# Patient Record
Sex: Male | Born: 1945
Health system: Southern US, Community
[De-identification: ages and names within clinical notes are randomized; demographics above are authoritative.]

## PROBLEM LIST (undated history)

## (undated) DIAGNOSIS — K219 Gastro-esophageal reflux disease without esophagitis: Secondary | ICD-10-CM

## (undated) DIAGNOSIS — E785 Hyperlipidemia, unspecified: Secondary | ICD-10-CM

## (undated) DIAGNOSIS — R079 Chest pain, unspecified: Secondary | ICD-10-CM

## (undated) DIAGNOSIS — G40209 Localization-related (focal) (partial) symptomatic epilepsy and epileptic syndromes with complex partial seizures, not intractable, without status epilepticus: Secondary | ICD-10-CM

## (undated) DIAGNOSIS — R569 Unspecified convulsions: Secondary | ICD-10-CM

## (undated) DIAGNOSIS — R001 Bradycardia, unspecified: Secondary | ICD-10-CM

## (undated) DIAGNOSIS — N529 Male erectile dysfunction, unspecified: Secondary | ICD-10-CM

## (undated) DIAGNOSIS — M1990 Unspecified osteoarthritis, unspecified site: Secondary | ICD-10-CM

## (undated) DIAGNOSIS — J3489 Other specified disorders of nose and nasal sinuses: Secondary | ICD-10-CM

## (undated) DIAGNOSIS — H9193 Unspecified hearing loss, bilateral: Secondary | ICD-10-CM

## (undated) DIAGNOSIS — K8020 Calculus of gallbladder without cholecystitis without obstruction: Secondary | ICD-10-CM

## (undated) DIAGNOSIS — R109 Unspecified abdominal pain: Secondary | ICD-10-CM

## (undated) DIAGNOSIS — F329 Major depressive disorder, single episode, unspecified: Secondary | ICD-10-CM

## (undated) DIAGNOSIS — E079 Disorder of thyroid, unspecified: Secondary | ICD-10-CM

## (undated) DIAGNOSIS — F3289 Other specified depressive episodes: Secondary | ICD-10-CM

## (undated) DIAGNOSIS — J45909 Unspecified asthma, uncomplicated: Secondary | ICD-10-CM

## (undated) DIAGNOSIS — N200 Calculus of kidney: Secondary | ICD-10-CM

## (undated) HISTORY — DX: Unspecified hearing loss, bilateral: H91.93

## (undated) HISTORY — DX: Calculus of kidney: N20.0

## (undated) HISTORY — DX: Hyperlipidemia, unspecified: E78.5

## (undated) HISTORY — DX: Disorder of thyroid, unspecified: E07.9

## (undated) HISTORY — DX: Gastro-esophageal reflux disease without esophagitis: K21.9

## (undated) HISTORY — DX: Localization-related (focal) (partial) symptomatic epilepsy and epileptic syndromes with complex partial seizures, not intractable, without status epilepticus: G40.209

## (undated) HISTORY — DX: Unspecified asthma, uncomplicated: J45.909

## (undated) HISTORY — DX: Major depressive disorder, single episode, unspecified: F32.9

## (undated) HISTORY — DX: Male erectile dysfunction, unspecified: N52.9

## (undated) HISTORY — DX: Unspecified convulsions: R56.9

## (undated) HISTORY — DX: Unspecified osteoarthritis, unspecified site: M19.90

## (undated) HISTORY — DX: Other specified depressive episodes: F32.89

## (undated) HISTORY — DX: Other specified disorders of nose and nasal sinuses: J34.89

---

## 1994-07-15 HISTORY — PX: OTHER SURGICAL HISTORY: SHX169

## 2002-09-22 ENCOUNTER — Ambulatory Visit (HOSPITAL_COMMUNITY): Admission: RE | Admit: 2002-09-22 | Discharge: 2002-09-22 | Payer: Self-pay | Admitting: Internal Medicine

## 2009-01-12 LAB — HM COLONOSCOPY

## 2009-09-13 LAB — FECAL OCCULT BLOOD, GUAIAC: Fecal Occult Blood: NEGATIVE

## 2010-10-05 ENCOUNTER — Encounter: Payer: Self-pay | Admitting: Family Medicine

## 2010-10-05 DIAGNOSIS — E785 Hyperlipidemia, unspecified: Secondary | ICD-10-CM | POA: Insufficient documentation

## 2010-10-05 DIAGNOSIS — G40209 Localization-related (focal) (partial) symptomatic epilepsy and epileptic syndromes with complex partial seizures, not intractable, without status epilepticus: Secondary | ICD-10-CM | POA: Insufficient documentation

## 2010-10-05 DIAGNOSIS — N529 Male erectile dysfunction, unspecified: Secondary | ICD-10-CM

## 2010-10-05 DIAGNOSIS — N200 Calculus of kidney: Secondary | ICD-10-CM | POA: Insufficient documentation

## 2010-10-05 DIAGNOSIS — J3489 Other specified disorders of nose and nasal sinuses: Secondary | ICD-10-CM | POA: Insufficient documentation

## 2010-10-05 DIAGNOSIS — H269 Unspecified cataract: Secondary | ICD-10-CM | POA: Insufficient documentation

## 2010-10-05 DIAGNOSIS — F329 Major depressive disorder, single episode, unspecified: Secondary | ICD-10-CM

## 2010-10-05 DIAGNOSIS — N400 Enlarged prostate without lower urinary tract symptoms: Secondary | ICD-10-CM | POA: Insufficient documentation

## 2010-10-05 DIAGNOSIS — K644 Residual hemorrhoidal skin tags: Secondary | ICD-10-CM | POA: Insufficient documentation

## 2011-10-29 ENCOUNTER — Ambulatory Visit: Payer: Medicare Other | Attending: Internal Medicine | Admitting: Physical Therapy

## 2011-10-29 DIAGNOSIS — R5381 Other malaise: Secondary | ICD-10-CM | POA: Insufficient documentation

## 2011-10-29 DIAGNOSIS — M25619 Stiffness of unspecified shoulder, not elsewhere classified: Secondary | ICD-10-CM | POA: Insufficient documentation

## 2011-10-29 DIAGNOSIS — M25519 Pain in unspecified shoulder: Secondary | ICD-10-CM | POA: Insufficient documentation

## 2011-10-29 DIAGNOSIS — IMO0001 Reserved for inherently not codable concepts without codable children: Secondary | ICD-10-CM | POA: Insufficient documentation

## 2011-11-01 ENCOUNTER — Ambulatory Visit: Payer: Medicare Other | Admitting: Physical Therapy

## 2011-11-05 ENCOUNTER — Ambulatory Visit: Payer: Medicare Other | Admitting: *Deleted

## 2011-11-07 ENCOUNTER — Ambulatory Visit: Payer: Medicare Other | Admitting: *Deleted

## 2011-11-11 ENCOUNTER — Ambulatory Visit: Payer: Medicare Other | Admitting: Physical Therapy

## 2011-11-14 ENCOUNTER — Ambulatory Visit: Payer: Medicare Other | Attending: Internal Medicine | Admitting: *Deleted

## 2011-11-14 DIAGNOSIS — R5381 Other malaise: Secondary | ICD-10-CM | POA: Insufficient documentation

## 2011-11-14 DIAGNOSIS — IMO0001 Reserved for inherently not codable concepts without codable children: Secondary | ICD-10-CM | POA: Insufficient documentation

## 2011-11-14 DIAGNOSIS — M25619 Stiffness of unspecified shoulder, not elsewhere classified: Secondary | ICD-10-CM | POA: Insufficient documentation

## 2011-11-14 DIAGNOSIS — M25519 Pain in unspecified shoulder: Secondary | ICD-10-CM | POA: Insufficient documentation

## 2011-11-19 ENCOUNTER — Ambulatory Visit: Payer: Medicare Other | Admitting: Physical Therapy

## 2011-11-21 ENCOUNTER — Ambulatory Visit: Payer: Medicare Other | Admitting: Physical Therapy

## 2011-11-26 ENCOUNTER — Ambulatory Visit: Payer: Medicare Other | Admitting: Physical Therapy

## 2011-11-28 ENCOUNTER — Ambulatory Visit: Payer: Medicare Other | Admitting: Physical Therapy

## 2011-12-03 ENCOUNTER — Ambulatory Visit: Payer: Medicare Other | Admitting: Physical Therapy

## 2011-12-05 ENCOUNTER — Ambulatory Visit: Payer: Medicare Other | Admitting: Physical Therapy

## 2012-03-26 ENCOUNTER — Ambulatory Visit: Payer: Medicare Other | Admitting: Cardiology

## 2012-04-22 ENCOUNTER — Encounter: Payer: Self-pay | Admitting: Cardiology

## 2012-04-22 ENCOUNTER — Ambulatory Visit (INDEPENDENT_AMBULATORY_CARE_PROVIDER_SITE_OTHER): Payer: Medicare Other | Admitting: Cardiology

## 2012-04-22 VITALS — BP 105/70 | HR 59 | Ht 66.0 in | Wt 159.0 lb

## 2012-04-22 DIAGNOSIS — R9431 Abnormal electrocardiogram [ECG] [EKG]: Secondary | ICD-10-CM

## 2012-04-22 NOTE — Patient Instructions (Addendum)
The current medical regimen is effective;  continue present plan and medications.  Your physician has requested that you have an echocardiogram. Echocardiography is a painless test that uses sound waves to create images of your heart. It provides your doctor with information about the size and shape of your heart and how well your heart's chambers and valves are working. This procedure takes approximately one hour. There are no restrictions for this procedure.  Follow up as needed. 

## 2012-04-22 NOTE — Progress Notes (Signed)
HPI The patient presents as a new patient for evaluation of an abnormal EKG. He has no prior cardiac history. I did review outside records which included an exercise treadmill test in 2008 and there was no evidence of ischemia on this. He had a narrow complex EKG. He now presents with an EKG demonstrating wide complex consistent with an interventricular conduction delay or atypical left bundle branch block. He does not describe any cardiovascular symptoms. He walks daily. He exercises in rehabilitation review shoulder. He does some exercising at home. With this he denies any chest pressure, neck or arm discomfort. He never notices any palpitations, presyncope or syncope. He has had no PND or orthopnea. He denies any bleeding or edema. I reviewed labs that included normal electrolytes with only minimally elevated potassium. His most recent LDL was 89 with HDL 51. I reviewed several blood pressure readings from previous primary care appointments and these were normal.   No Known Allergies  Current Outpatient Prescriptions  Medication Sig Dispense Refill  . aspirin 81 MG EC tablet Take 81 mg by mouth daily.        Marland Kitchen atorvastatin (LIPITOR) 40 MG tablet Take 40 mg by mouth daily. 40 MGS  ON MON-WED-FRI 20 MGS ON TUE-THUR-SAT-SUN       . Cholecalciferol (VITAMIN D3) 2000 UNITS TABS Take 1 tablet by mouth daily.        Marland Kitchen escitalopram (LEXAPRO) 20 MG tablet Take 20 mg by mouth daily.        Marland Kitchen ezetimibe (ZETIA) 10 MG tablet Take 10 mg by mouth daily.      . fish oil-omega-3 fatty acids 1000 MG capsule Take 1 g by mouth daily.      . folic acid (FOLVITE) 1 MG tablet Take 1 mg by mouth daily.        . Lacosamide (VIMPAT) 150 MG TABS Take 1 tablet by mouth 2 (two) times daily.        Marland Kitchen levothyroxine (SYNTHROID, LEVOTHROID) 50 MCG tablet Take 50 mcg by mouth daily.      . methotrexate (RHEUMATREX) 5 MG tablet Take 5 mg by mouth 2 (two) times a week. Caution: Chemotherapy. Protect from light.       .  primidone (MYSOLINE) 250 MG tablet Take 250 mg by mouth 4 (four) times daily.        Past Medical History  Diagnosis Date  . Other and unspecified hyperlipidemia   . Depressive disorder, not elsewhere classified   . Localization-related (focal) (partial) epilepsy and epileptic syndromes with complex partial seizures, without mention of intractable epilepsy   . Kidney stone   . Impotence of organic origin   . External hemorrhoid   . Nasal septal perforation   . Cataract   . Hyperplasia of prostate     Past Surgical History  Procedure Date  . Left shoulder surgery     Family History  Problem Relation Age of Onset  . Kidney failure Father     died age 93  . Alzheimer's disease Father     died age 79    History   Social History  . Marital Status: Married    Spouse Name: N/A    Number of Children: 2  . Years of Education: N/A   Occupational History  . Retired    Social History Main Topics  . Smoking status: Never Smoker   . Smokeless tobacco: Not on file  . Alcohol Use: No  . Drug Use: No  .  Sexually Active: Not on file   Other Topics Concern  . Not on file   Social History Narrative   Lives with wife.      ROS: Arthritis seasonal allergies, occasional diarrhea, hearing loss. Otherwise as stated in the history of present illness and negative for all other systems.  PHYSICAL EXAM BP 105/70  Pulse 59  Ht 5\' 6"  (1.676 m)  Wt 72.122 kg (159 lb)  BMI 25.66 kg/m2 GENERAL:  Well appearing HEENT:  Pupils equal round and reactive, fundi not visualized, oral mucosa unremarkable NECK:  No jugular venous distention, waveform within normal limits, carotid upstroke brisk and symmetric, no bruits, no thyromegaly LYMPHATICS:  No cervical, inguinal adenopathy LUNGS:  Clear to auscultation bilaterally BACK:  No CVA tenderness CHEST:  Unremarkable HEART:  PMI not displaced or sustained,S1 and S2 within normal limits, no S3, no S4, no clicks, no rubs, no murmurs ABD:   Flat, positive bowel sounds normal in frequency in pitch, no bruits, no rebound, no guarding, no midline pulsatile mass, no hepatomegaly, no splenomegaly EXT:  2 plus pulses throughout, no edema, no cyanosis no clubbing SKIN:  No rashes no nodules NEURO:  Cranial nerves II through XII grossly intact, motor grossly intact throughout [facility]:  Cognitively intact, oriented to person place and time   EKG:  Sinus bradycardia, rate 59, left bundle branch block, left axis deviation 04/22/2012  ASSESSMENT AND PLAN  Abnormal EKG - I do not suspect this represents obstructive coronary disease. I don't think that he is having asymptomatic bradycardia arrhythmias consistent with this conduction abnormality. I will make sure he has a structurally normal heart and no evidence of previous silent MI.  I will order and echocardiogram. If this is normal, in the absence of further symptoms no further testing will be indicated.   Hyperlipidemia  I reviewed these as above. This is excellent and managed by Dr. Christell Constant. No change in therapy is indicated.

## 2012-04-29 ENCOUNTER — Ambulatory Visit (HOSPITAL_COMMUNITY): Payer: Medicare Other | Attending: Cardiology | Admitting: Radiology

## 2012-04-29 ENCOUNTER — Other Ambulatory Visit: Payer: Self-pay

## 2012-04-29 ENCOUNTER — Other Ambulatory Visit (HOSPITAL_COMMUNITY): Payer: Self-pay | Admitting: Cardiology

## 2012-04-29 DIAGNOSIS — R9431 Abnormal electrocardiogram [ECG] [EKG]: Secondary | ICD-10-CM | POA: Insufficient documentation

## 2012-04-29 MED ORDER — PERFLUTREN PROTEIN A MICROSPH IV SUSP
2.0000 mL | Freq: Once | INTRAVENOUS | Status: AC
  Administered 2012-04-29: 2 mL via INTRAVENOUS

## 2012-05-04 ENCOUNTER — Telehealth: Payer: Self-pay

## 2012-05-04 NOTE — Telephone Encounter (Signed)
Patient aware of Echo results.

## 2012-05-04 NOTE — Telephone Encounter (Signed)
Message copied by Yolonda Kida on Mon May 04, 2012  4:01 PM ------      Message from: Rollene Rotunda      Created: Sun May 03, 2012  8:57 PM       No significant abnormalities.  No further work up.  Call Mr. Missildine with the results and send results to Rudi Heap, MD

## 2012-12-28 ENCOUNTER — Ambulatory Visit: Payer: Medicare Other | Attending: Neurology

## 2012-12-28 DIAGNOSIS — IMO0001 Reserved for inherently not codable concepts without codable children: Secondary | ICD-10-CM | POA: Insufficient documentation

## 2012-12-28 DIAGNOSIS — M2560 Stiffness of unspecified joint, not elsewhere classified: Secondary | ICD-10-CM | POA: Insufficient documentation

## 2012-12-28 DIAGNOSIS — Z9181 History of falling: Secondary | ICD-10-CM | POA: Insufficient documentation

## 2012-12-28 DIAGNOSIS — R5381 Other malaise: Secondary | ICD-10-CM | POA: Insufficient documentation

## 2012-12-31 ENCOUNTER — Ambulatory Visit: Payer: Medicare Other | Admitting: Physical Therapy

## 2013-01-04 ENCOUNTER — Ambulatory Visit: Payer: Medicare Other | Admitting: Physical Therapy

## 2013-01-06 ENCOUNTER — Ambulatory Visit: Payer: Medicare Other | Admitting: Physical Therapy

## 2013-01-12 ENCOUNTER — Ambulatory Visit: Payer: Medicare Other | Attending: Neurology | Admitting: Physical Therapy

## 2013-01-12 DIAGNOSIS — IMO0001 Reserved for inherently not codable concepts without codable children: Secondary | ICD-10-CM | POA: Insufficient documentation

## 2013-01-12 DIAGNOSIS — Z9181 History of falling: Secondary | ICD-10-CM | POA: Insufficient documentation

## 2013-01-12 DIAGNOSIS — R5381 Other malaise: Secondary | ICD-10-CM | POA: Insufficient documentation

## 2013-01-12 DIAGNOSIS — M2560 Stiffness of unspecified joint, not elsewhere classified: Secondary | ICD-10-CM | POA: Insufficient documentation

## 2013-01-14 ENCOUNTER — Encounter: Payer: Self-pay | Admitting: Family Medicine

## 2013-01-14 ENCOUNTER — Ambulatory Visit (INDEPENDENT_AMBULATORY_CARE_PROVIDER_SITE_OTHER): Payer: Medicare Other | Admitting: Family Medicine

## 2013-01-14 ENCOUNTER — Ambulatory Visit: Payer: Medicare Other | Admitting: Physical Therapy

## 2013-01-14 VITALS — BP 103/56 | HR 57 | Temp 97.3°F | Ht 65.5 in | Wt 154.0 lb

## 2013-01-14 DIAGNOSIS — R5383 Other fatigue: Secondary | ICD-10-CM

## 2013-01-14 DIAGNOSIS — R5381 Other malaise: Secondary | ICD-10-CM

## 2013-01-14 DIAGNOSIS — E785 Hyperlipidemia, unspecified: Secondary | ICD-10-CM

## 2013-01-14 DIAGNOSIS — Z79899 Other long term (current) drug therapy: Secondary | ICD-10-CM

## 2013-01-14 DIAGNOSIS — E559 Vitamin D deficiency, unspecified: Secondary | ICD-10-CM

## 2013-01-14 LAB — HEPATIC FUNCTION PANEL
ALT: 31 U/L (ref 0–53)
AST: 37 U/L (ref 0–37)
Bilirubin, Direct: 0.1 mg/dL (ref 0.0–0.3)
Total Protein: 7.2 g/dL (ref 6.0–8.3)

## 2013-01-14 LAB — POCT CBC
Granulocyte percent: 60.4 %G (ref 37–80)
HCT, POC: 38.4 % — AB (ref 43.5–53.7)
Lymph, poc: 2.1 (ref 0.6–3.4)
MCH, POC: 36.4 pg — AB (ref 27–31.2)
MCV: 101.6 fL — AB (ref 80–97)
Platelet Count, POC: 274 10*3/uL (ref 142–424)
RDW, POC: 13.5 %
WBC: 6.8 10*3/uL (ref 4.6–10.2)

## 2013-01-14 NOTE — Addendum Note (Signed)
Addended by: Lisbeth Ply C on: 01/14/2013 10:49 AM   Modules accepted: Orders

## 2013-01-14 NOTE — Progress Notes (Signed)
  Subjective:    Patient ID: Troy Rodgers, male    DOB: May 30, 1946, 67 y.o.   MRN: [id number]  HPI Patient comes in today for followup of chronic medical problems. These include hyperlipidemia, hypothyroidism, rheumatoid arthritis, gastroesophageal reflux, and seizure disorder. He is up-to-date on all of his health maintenance issues. The patient is followed by a rheumatologist and a neurologist. He is seen by both of these specialists every 3-4 months. He will have labs drawn today to match it with his diagnoses. His seizure medicine  drug levels are done by the neurologist. He will need a PSA and prostate exam on the next visit. As of note he is also hearing impaired and wears hearing aids bilaterally.   Review of Systems  Constitutional: Positive for fatigue.  HENT: Positive for congestion (slight, some drainage) and postnasal drip. Negative for ear pain.   Eyes: Positive for discharge (watery).  Respiratory: Negative.   Cardiovascular: Negative.   Gastrointestinal: Positive for diarrhea (occasional).  Genitourinary: Negative.   Musculoskeletal: Positive for arthralgias (RA, controlled).  Skin: Negative.   Allergic/Immunologic: Positive for environmental allergies (seasonal).  Neurological: Positive for seizures (minor, 1-2 x month). Negative for dizziness and headaches.  Psychiatric/Behavioral: Negative.        Objective:   Physical Exam BP 103/56  Pulse 57  Temp(Src) 97.3 F (36.3 C) (Oral)  Ht 5' 5.5" (1.664 m)  Wt 154 lb (69.854 kg)  BMI 25.23 kg/m2  The patient appeared well nourished and normally developed, alert and oriented to time and place. Speech, behavior and judgement appear normal. Vital signs as documented.  Head exam is unremarkable. No scleral icterus or pallor noted. He is wearing bilateral hearing aids.  Neck is without jugular venous distension, thyromegally, or carotid bruits. Carotid upstrokes are brisk bilaterally. No cervical adenopathy. Lungs are clear  anteriorly and posteriorly to auscultation. Normal respiratory effort. The patient has a mild degree of kyphosis. Cardiac exam reveals regular rate and rhythm at 60 per minute. First and second heart sounds normal.  No murmurs, rubs or gallops.  Abdominal exam reveals normal bowl sounds, no masses, no organomegaly and no aortic enlargement. No inguinal adenopathy. There was no abdominal tenderness. Extremities are nonedematous and both femoral and pedal pulses are normal. Skin without pallor or jaundice.  Warm and dry, without rash. Neurologic exam reveals normal deep tendon reflexes and normal sensation.          Assessment & Plan:  1. Fatigue - POCT CBC; Standing  2. Vitamin D deficiency - Vitamin D 25 hydroxy; Standing  3. Hyperlipemia - Hepatic function panel; Standing - NMR Lipoprofile with Lipids; Standing  4. High risk medication use - Hepatic function panel; Standing - BASIC METABOLIC PANEL WITH GFR; Standing  Patient Instructions  Fall precautions discussed Continue current meds and therapeutic lifestyle changes You will be due for a prostate exam and PSA on the next visit. Exercise regularly and drink plenty of fluids

## 2013-01-14 NOTE — Patient Instructions (Addendum)
Fall precautions discussed Continue current meds and therapeutic lifestyle changes You will be due for a prostate exam and PSA on the next visit. Exercise regularly and drink plenty of fluids

## 2013-01-15 LAB — BASIC METABOLIC PANEL WITH GFR
BUN: 17 mg/dL (ref 6–23)
Calcium: 9.5 mg/dL (ref 8.4–10.5)
GFR, Est African American: 88 mL/min
GFR, Est Non African American: 76 mL/min
Glucose, Bld: 70 mg/dL (ref 70–99)
Sodium: 138 mEq/L (ref 135–145)

## 2013-01-18 ENCOUNTER — Ambulatory Visit: Payer: Medicare Other | Admitting: Physical Therapy

## 2013-01-18 LAB — NMR LIPOPROFILE WITH LIPIDS
Cholesterol, Total: 139 mg/dL (ref <200)
HDL Particle Number: 27.7 umol/L — ABNORMAL LOW (ref >=30.5)
LDL Particle Number: 1086 nmol/L — ABNORMAL HIGH (ref <1000)
LDL Size: 20.7 nm (ref >20.5)
Large VLDL-P: <0.8 nmol/L (ref <=2.7)
Small LDL Particle Number: 337 nmol/L (ref <=527)

## 2013-01-21 ENCOUNTER — Ambulatory Visit: Payer: Medicare Other

## 2013-01-26 ENCOUNTER — Ambulatory Visit: Payer: Medicare Other | Admitting: Physical Therapy

## 2013-01-28 ENCOUNTER — Telehealth: Payer: Self-pay | Admitting: Family Medicine

## 2013-01-28 ENCOUNTER — Ambulatory Visit: Payer: Medicare Other | Admitting: Physical Therapy

## 2013-01-29 ENCOUNTER — Telehealth: Payer: Self-pay | Admitting: Family Medicine

## 2013-02-01 NOTE — Telephone Encounter (Signed)
Discussed lipro profile results. Pt confused over LDL number over 1000.

## 2013-02-01 NOTE — Telephone Encounter (Signed)
Dup note  

## 2013-02-02 ENCOUNTER — Ambulatory Visit: Payer: Medicare Other | Admitting: Physical Therapy

## 2013-02-04 ENCOUNTER — Ambulatory Visit: Payer: Medicare Other | Admitting: Physical Therapy

## 2013-02-10 ENCOUNTER — Encounter: Payer: Self-pay | Admitting: *Deleted

## 2013-02-17 ENCOUNTER — Telehealth: Payer: Self-pay | Admitting: Family Medicine

## 2013-02-17 ENCOUNTER — Other Ambulatory Visit: Payer: Self-pay

## 2013-02-22 NOTE — Telephone Encounter (Signed)
Letters were being sent out incorrectly to some patients and the appropriate personnel at [facility] has been notified and the problem corrected.

## 2013-02-25 ENCOUNTER — Ambulatory Visit (INDEPENDENT_AMBULATORY_CARE_PROVIDER_SITE_OTHER): Payer: Medicare Other | Admitting: Family Medicine

## 2013-02-25 ENCOUNTER — Ambulatory Visit (INDEPENDENT_AMBULATORY_CARE_PROVIDER_SITE_OTHER): Payer: Medicare Other

## 2013-02-25 ENCOUNTER — Encounter: Payer: Self-pay | Admitting: Family Medicine

## 2013-02-25 VITALS — BP 109/63 | HR 53 | Temp 97.3°F | Ht 65.5 in | Wt 154.4 lb

## 2013-02-25 DIAGNOSIS — S96912A Strain of unspecified muscle and tendon at ankle and foot level, left foot, initial encounter: Secondary | ICD-10-CM

## 2013-02-25 DIAGNOSIS — M79672 Pain in left foot: Secondary | ICD-10-CM

## 2013-02-25 DIAGNOSIS — M79609 Pain in unspecified limb: Secondary | ICD-10-CM

## 2013-02-25 NOTE — Progress Notes (Signed)
  Subjective:    Patient ID: Troy Rodgers., male    DOB: 1946-02-19, 67 y.o.   MRN: [id number]  HPI Patient complains of left foot pain and swelling for one week. There has been no injry that he is aware of.    Review of Systems  Constitutional: Negative.   HENT: Negative.   Eyes: Negative.   Respiratory: Negative.   Cardiovascular: Negative.   Gastrointestinal: Negative.   Endocrine: Negative.   Genitourinary: Negative.   Musculoskeletal: Positive for arthralgias (L foot x 1 week, swelling).  Allergic/Immunologic: Negative.   Neurological: Negative.   Psychiatric/Behavioral: Negative.        Objective:   Physical Exam  Tender left dorsal foot lateral aspect. More pain with flexion, more pain with inversion. There was no redness or edema.  [facility] reading (PRIMARY) by  Dr.Cianni Manny: Left foot; within normal limits                                     Assessment & Plan:  1. Left foot pain - DG Foot Complete Left  2. Strain of left foot, initial encounter Patient Instructions  Use warm wet compresses 20 minutes 3 or 4 times daily Try taking some Advil or ibuprofen over-the-counter 1 x  3 times daily after meals for 5-7 days Make sure that you take your omeprazole regularly to protect her stomach from the ibuprofen   Nyra Capes MD

## 2013-02-25 NOTE — Patient Instructions (Signed)
Use warm wet compresses 20 minutes 3 or 4 times daily Try taking some Advil or ibuprofen over-the-counter 1 x  3 times daily after meals for 5-7 days Make sure that you take your omeprazole regularly to protect her stomach from the ibuprofen

## 2013-02-28 IMAGING — CR DG CHEST 2V
2 series · 2 of 2 positions shown · non-contrast
Comparison: None.

CLINICAL DATA: Cardiomegaly

CHEST - 2 VIEW

[view not recorded (1 of 2)]
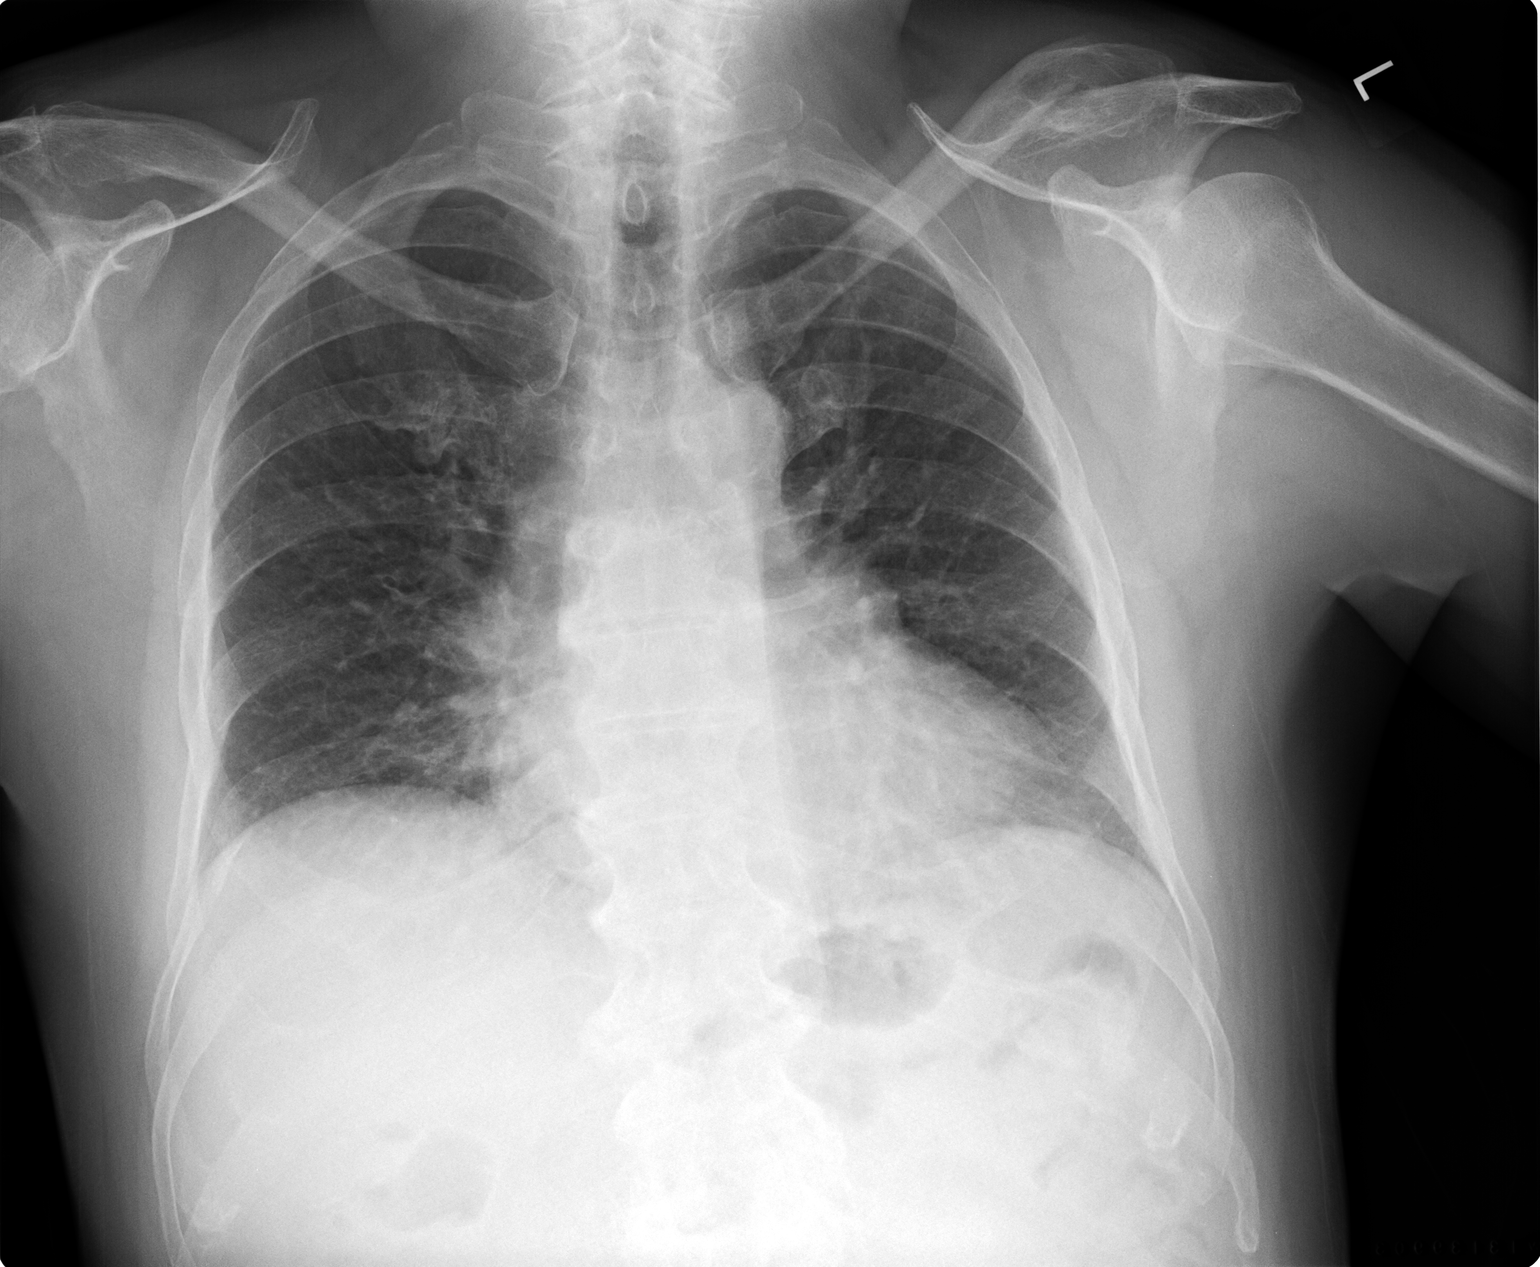

[view not recorded (2 of 2)]
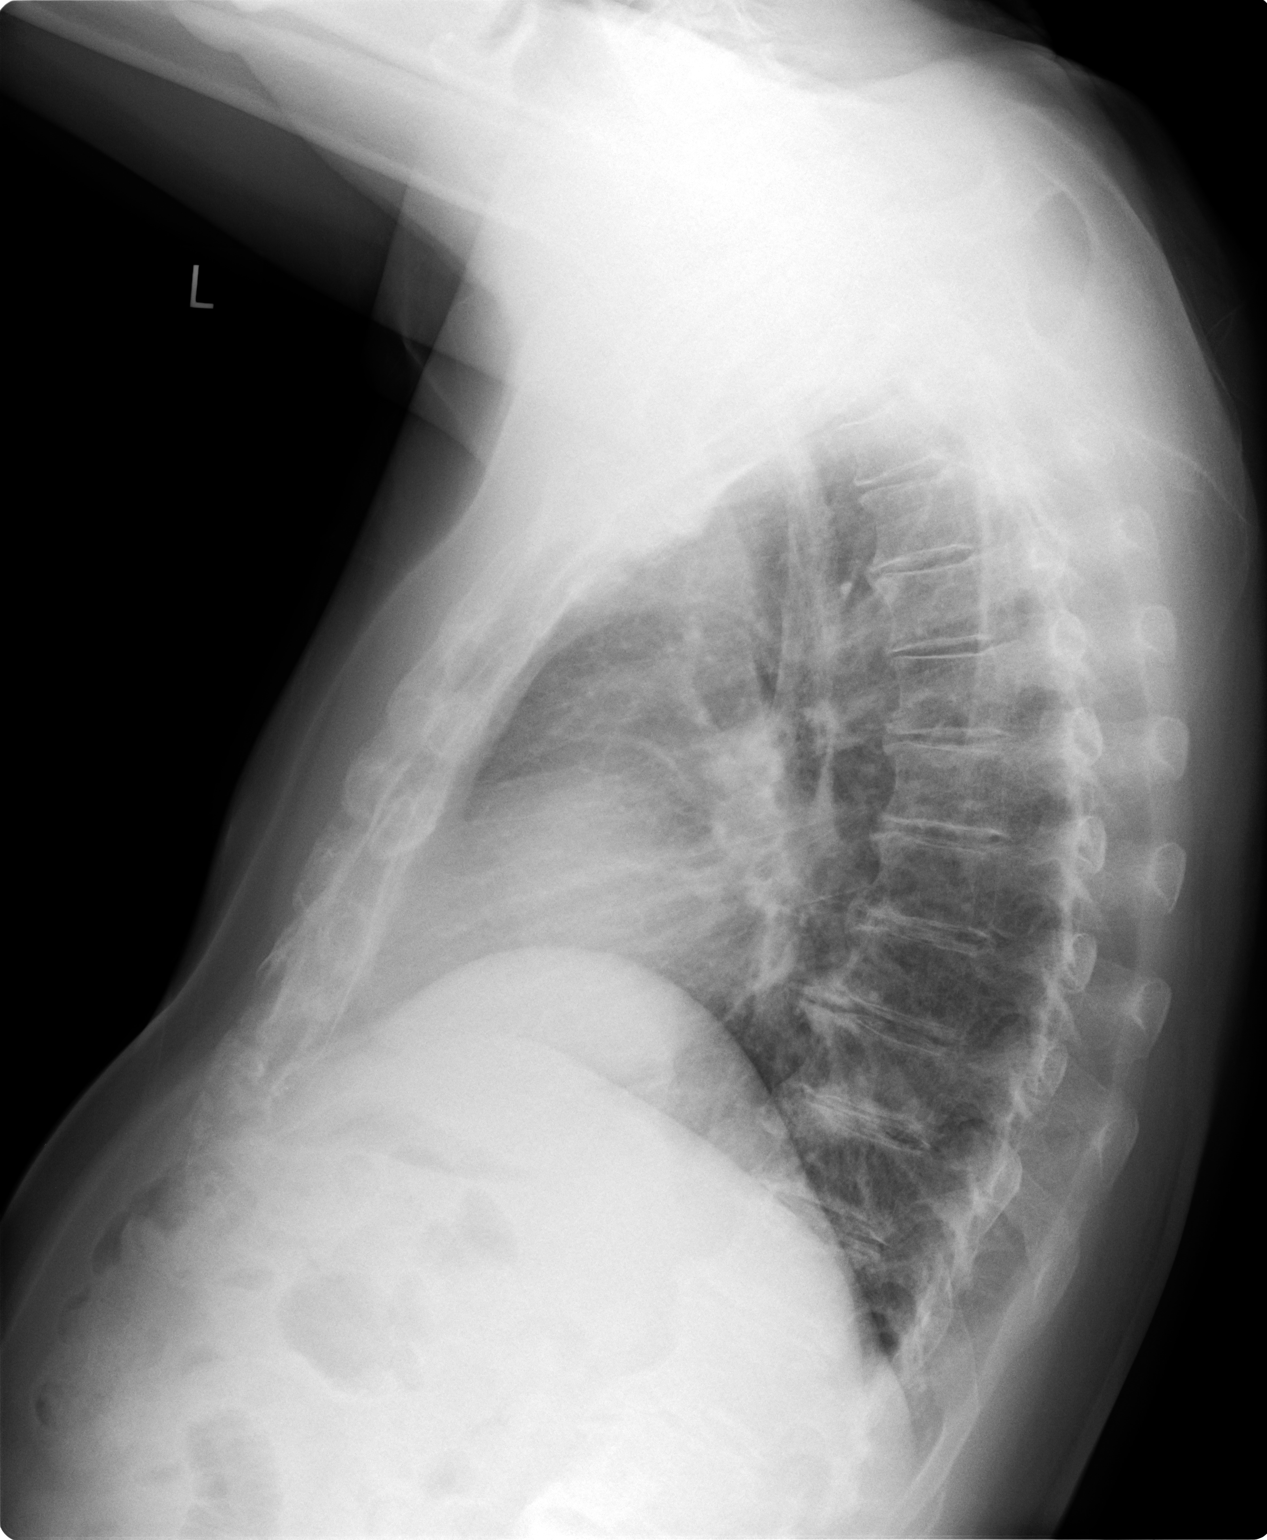

[2 of 2 positions shown; findings below may reference images not displayed]

FINDINGS: The heart is at the upper limits of normal in size.  The
lungs are clear.  No focal infiltrate is noted.  Degenerative
change of the thoracic spine is seen.
IMPRESSION: No acute abnormality noted.

## 2013-03-29 ENCOUNTER — Other Ambulatory Visit: Payer: Self-pay | Admitting: Family Medicine

## 2013-04-27 ENCOUNTER — Encounter (HOSPITAL_COMMUNITY): Payer: Self-pay | Admitting: Emergency Medicine

## 2013-04-27 ENCOUNTER — Observation Stay (HOSPITAL_COMMUNITY)
Admission: EM | Admit: 2013-04-27 | Discharge: 2013-04-30 | Disposition: A | Discharge status: Home / Self Care | Payer: Medicare Other | Attending: General Surgery | Admitting: General Surgery

## 2013-04-27 ENCOUNTER — Emergency Department (HOSPITAL_COMMUNITY): Payer: Medicare Other

## 2013-04-27 ENCOUNTER — Telehealth: Payer: Self-pay | Admitting: *Deleted

## 2013-04-27 DIAGNOSIS — K810 Acute cholecystitis: Principal | ICD-10-CM | POA: Insufficient documentation

## 2013-04-27 DIAGNOSIS — R5381 Other malaise: Secondary | ICD-10-CM | POA: Insufficient documentation

## 2013-04-27 DIAGNOSIS — R109 Unspecified abdominal pain: Secondary | ICD-10-CM

## 2013-04-27 DIAGNOSIS — R911 Solitary pulmonary nodule: Secondary | ICD-10-CM | POA: Insufficient documentation

## 2013-04-27 DIAGNOSIS — R55 Syncope and collapse: Secondary | ICD-10-CM

## 2013-04-27 DIAGNOSIS — K8020 Calculus of gallbladder without cholecystitis without obstruction: Secondary | ICD-10-CM

## 2013-04-27 DIAGNOSIS — E785 Hyperlipidemia, unspecified: Secondary | ICD-10-CM | POA: Insufficient documentation

## 2013-04-27 DIAGNOSIS — G40209 Localization-related (focal) (partial) symptomatic epilepsy and epileptic syndromes with complex partial seizures, not intractable, without status epilepticus: Secondary | ICD-10-CM | POA: Insufficient documentation

## 2013-04-27 DIAGNOSIS — J95821 Acute postprocedural respiratory failure: Secondary | ICD-10-CM | POA: Insufficient documentation

## 2013-04-27 DIAGNOSIS — Z79899 Other long term (current) drug therapy: Secondary | ICD-10-CM | POA: Insufficient documentation

## 2013-04-27 DIAGNOSIS — E039 Hypothyroidism, unspecified: Secondary | ICD-10-CM | POA: Insufficient documentation

## 2013-04-27 DIAGNOSIS — M069 Rheumatoid arthritis, unspecified: Secondary | ICD-10-CM | POA: Insufficient documentation

## 2013-04-27 HISTORY — DX: Bradycardia, unspecified: R00.1

## 2013-04-27 HISTORY — DX: Calculus of gallbladder without cholecystitis without obstruction: K80.20

## 2013-04-27 HISTORY — DX: Unspecified abdominal pain: R10.9

## 2013-04-27 HISTORY — DX: Chest pain, unspecified: R07.9

## 2013-04-27 LAB — CBC WITH DIFFERENTIAL/PLATELET
Basophils Absolute: 0 10*3/uL (ref 0.0–0.1)
Basophils Relative: 0 % (ref 0–1)
Eosinophils Absolute: 0 10*3/uL (ref 0.0–0.7)
Eosinophils Relative: 0 % (ref 0–5)
HCT: 36 % — ABNORMAL LOW (ref 39.0–52.0)
Lymphocytes Relative: 7 % — ABNORMAL LOW (ref 12–46)
MCHC: 36.1 g/dL — ABNORMAL HIGH (ref 30.0–36.0)
MCV: 100 fL (ref 78.0–100.0)
Monocytes Absolute: 3.7 10*3/uL — ABNORMAL HIGH (ref 0.1–1.0)
Neutrophils Relative %: 75 % (ref 43–77)
Platelets: 262 10*3/uL (ref 150–400)
RDW: 13.1 % (ref 11.5–15.5)
WBC: 20.7 10*3/uL — ABNORMAL HIGH (ref 4.0–10.5)

## 2013-04-27 LAB — URINALYSIS, ROUTINE W REFLEX MICROSCOPIC
Bilirubin Urine: NEGATIVE
Glucose, UA: NEGATIVE mg/dL
Hgb urine dipstick: NEGATIVE
Leukocytes, UA: NEGATIVE
Specific Gravity, Urine: 1.031 — ABNORMAL HIGH (ref 1.005–1.030)
Urobilinogen, UA: 0.2 mg/dL (ref 0.0–1.0)
pH: 5 (ref 5.0–8.0)

## 2013-04-27 LAB — URINE MICROSCOPIC-ADD ON

## 2013-04-27 LAB — COMPREHENSIVE METABOLIC PANEL
ALT: 50 U/L (ref 0–53)
AST: 63 U/L — ABNORMAL HIGH (ref 0–37)
Albumin: 3.2 g/dL — ABNORMAL LOW (ref 3.5–5.2)
CO2: 29 mEq/L (ref 19–32)
Calcium: 9.1 mg/dL (ref 8.4–10.5)
Chloride: 93 mEq/L — ABNORMAL LOW (ref 96–112)
Creatinine, Ser: 0.98 mg/dL (ref 0.50–1.35)
Sodium: 131 mEq/L — ABNORMAL LOW (ref 135–145)
Total Bilirubin: 0.4 mg/dL (ref 0.3–1.2)
Total Protein: 7.5 g/dL (ref 6.0–8.3)

## 2013-04-27 MED ORDER — ESCITALOPRAM OXALATE 20 MG PO TABS
20.0000 mg | ORAL_TABLET | Freq: Every day | ORAL | Status: DC
  Administered 2013-04-28 – 2013-04-30 (×3): 20 mg via ORAL
  Filled 2013-04-27 (×3): qty 1

## 2013-04-27 MED ORDER — CIPROFLOXACIN IN D5W 400 MG/200ML IV SOLN
400.0000 mg | Freq: Two times a day (BID) | INTRAVENOUS | Status: DC
  Administered 2013-04-27 – 2013-04-28 (×2): 400 mg via INTRAVENOUS
  Filled 2013-04-27 (×3): qty 200

## 2013-04-27 MED ORDER — SODIUM CHLORIDE 0.9 % IV BOLUS (SEPSIS)
1000.0000 mL | Freq: Once | INTRAVENOUS | Status: AC
  Administered 2013-04-27: 1000 mL via INTRAVENOUS

## 2013-04-27 MED ORDER — SODIUM CHLORIDE 0.9 % IV SOLN
INTRAVENOUS | Status: DC
  Administered 2013-04-27 – 2013-04-29 (×4): via INTRAVENOUS

## 2013-04-27 MED ORDER — PANTOPRAZOLE SODIUM 40 MG PO TBEC
40.0000 mg | DELAYED_RELEASE_TABLET | Freq: Every day | ORAL | Status: DC
  Administered 2013-04-28 – 2013-04-30 (×3): 40 mg via ORAL
  Filled 2013-04-27 (×3): qty 1

## 2013-04-27 MED ORDER — LACOSAMIDE 50 MG PO TABS
200.0000 mg | ORAL_TABLET | Freq: Two times a day (BID) | ORAL | Status: DC
  Administered 2013-04-28 – 2013-04-30 (×4): 200 mg via ORAL
  Filled 2013-04-27 (×5): qty 4

## 2013-04-27 MED ORDER — ENOXAPARIN SODIUM 40 MG/0.4ML ~~LOC~~ SOLN
40.0000 mg | SUBCUTANEOUS | Status: DC
  Filled 2013-04-27: qty 0.4

## 2013-04-27 MED ORDER — ONDANSETRON HCL 4 MG/2ML IJ SOLN
4.0000 mg | Freq: Once | INTRAMUSCULAR | Status: AC
  Administered 2013-04-27: 4 mg via INTRAVENOUS
  Filled 2013-04-27: qty 2

## 2013-04-27 MED ORDER — ATORVASTATIN CALCIUM 40 MG PO TABS
40.0000 mg | ORAL_TABLET | Freq: Every day | ORAL | Status: DC
  Administered 2013-04-28 – 2013-04-30 (×3): 40 mg via ORAL
  Filled 2013-04-27 (×3): qty 1

## 2013-04-27 MED ORDER — FENTANYL CITRATE 0.05 MG/ML IJ SOLN
100.0000 ug | Freq: Once | INTRAMUSCULAR | Status: AC
  Administered 2013-04-27: 100 ug via INTRAVENOUS
  Filled 2013-04-27: qty 2

## 2013-04-27 MED ORDER — PRIMIDONE 250 MG PO TABS
250.0000 mg | ORAL_TABLET | Freq: Three times a day (TID) | ORAL | Status: DC
  Administered 2013-04-28 – 2013-04-30 (×6): 250 mg via ORAL
  Filled 2013-04-27 (×9): qty 1

## 2013-04-27 MED ORDER — EZETIMIBE 10 MG PO TABS
10.0000 mg | ORAL_TABLET | Freq: Every day | ORAL | Status: DC
  Administered 2013-04-28 – 2013-04-30 (×3): 10 mg via ORAL
  Filled 2013-04-27 (×3): qty 1

## 2013-04-27 MED ORDER — LEVOTHYROXINE SODIUM 50 MCG PO TABS
50.0000 ug | ORAL_TABLET | Freq: Every day | ORAL | Status: DC
  Administered 2013-04-28 – 2013-04-30 (×3): 50 ug via ORAL
  Filled 2013-04-27 (×4): qty 1

## 2013-04-27 MED ORDER — ONDANSETRON HCL 4 MG/2ML IJ SOLN: 4.0000 mg | Freq: Four times a day (QID) | INTRAMUSCULAR | Status: DC | PRN

## 2013-04-27 MED ORDER — HYDROMORPHONE HCL PF 1 MG/ML IJ SOLN
0.5000 mg | INTRAMUSCULAR | Status: DC | PRN
  Administered 2013-04-27 – 2013-04-29 (×4): 0.5 mg via INTRAVENOUS
  Filled 2013-04-27 (×5): qty 1

## 2013-04-27 NOTE — ED Notes (Signed)
Switched pt to Troy Rodgers, pats O2 sats remain 100%

## 2013-04-27 NOTE — ED Notes (Signed)
Patient transported to CT 

## 2013-04-27 NOTE — Telephone Encounter (Signed)
Wife called stating that husband has been in severe pain for a few days. Seen in ER and observed Friday and Sat and released. Returned Sunday with no pain relief. They did a CT and thinks it may be his gallbladder. They instructed the wife and patient that he needs a [place] of his gallbladder but they were unable to do and they need to follow up with primary care provider for an order?? She states they gave him pain meds and that he is staggering all over the place while taking them and fell this am because he is so out of it. She says he has to have the meds because he is in so much pain. Can [place] be ordered stat today? Please advise

## 2013-04-27 NOTE — ED Notes (Signed)
Pt's O2 sats decreased to 84% ra, continued to decrease to 68% ra, pt briefly unresponsive, pt not responding to verbal stimuli or sternal rub, NRB applied, additional ED requested, Tresa Endo FNP, Lauren RN, Beryle Lathe, and Rande Lawman RN at bedside to assist, pt sats gradually increased back to normal, pt currently 100% 15 L via NRB

## 2013-04-27 NOTE — H&P (Signed)
Troy Rodgers. is an 67 y.o. male.   Chief Complaint: abdominal pain HPI: 3 day history of abdominal pain.  Started sat after diner.Went to ED in [place] and sent home.  Went back yesterday with continued pain and sent home.  Has syncopal episode at home? Today but not a seizure according to wife.  Had pain with U/S.  Low grade fever.  Gallstones on U/S.   Past Medical History  Diagnosis Date  . Other and unspecified hyperlipidemia   . Depressive disorder, not elsewhere classified   . Localization-related (focal) (partial) epilepsy and epileptic syndromes with complex partial seizures, without mention of intractable epilepsy   . Kidney stone   . Impotence of organic origin   . External hemorrhoid   . Nasal septal perforation   . Cataract   . Hyperplasia of prostate   . Cholelithiases   . Chest pain   . Bradycardia   . Abdominal pain     Past Surgical History  Procedure Laterality Date  . Left shoulder surgery      Family History  Problem Relation Age of Onset  . Kidney failure Father     died age 51  . Alzheimer's disease Father     died age 25   Social History:  reports that he has never smoked. He does not have any smokeless tobacco history on file. He reports that he does not drink alcohol or use illicit drugs.  Allergies:  Allergies  Allergen Reactions  . Fentanyl Other (See Comments)    In high doses of 100 mcg pt's O2 sats decrease to 60% RA  . Hydrocodone     Intolerance      (Not in a hospital admission)  Results for orders placed during the hospital encounter of 04/27/13 (from the past 48 hour(s))  CBC WITH DIFFERENTIAL     Status: Abnormal   Collection Time    04/27/13  3:06 PM      Result Value Range   WBC 20.7 (*) 4.0 - 10.5 K/uL   RBC 3.60 (*) 4.22 - 5.81 MIL/uL   Hemoglobin 13.0  13.0 - 17.0 g/dL   HCT 16.1 (*) 09.6 - 04.5 %   MCV 100.0  78.0 - 100.0 fL   MCH 36.1 (*) 26.0 - 34.0 pg   MCHC 36.1 (*) 30.0 - 36.0 g/dL   RDW 40.9  81.1 - 91.4 %    Platelets 262  150 - 400 K/uL   Neutrophils Relative % 75  43 - 77 %   Neutro Abs 15.5 (*) 1.7 - 7.7 K/uL   Lymphocytes Relative 7 (*) 12 - 46 %   Lymphs Abs 1.4  0.7 - 4.0 K/uL   Monocytes Relative 18 (*) 3 - 12 %   Monocytes Absolute 3.7 (*) 0.1 - 1.0 K/uL   Eosinophils Relative 0  0 - 5 %   Eosinophils Absolute 0.0  0.0 - 0.7 K/uL   Basophils Relative 0  0 - 1 %   Basophils Absolute 0.0  0.0 - 0.1 K/uL  COMPREHENSIVE METABOLIC PANEL     Status: Abnormal   Collection Time    04/27/13  3:06 PM      Result Value Range   Sodium 131 (*) 135 - 145 mEq/L   Potassium 4.3  3.5 - 5.1 mEq/L   Chloride 93 (*) 96 - 112 mEq/L   CO2 29  19 - 32 mEq/L   Glucose, Bld 147 (*) 70 - 99 mg/dL  BUN 10  6 - 23 mg/dL   Creatinine, Ser 1.61  0.50 - 1.35 mg/dL   Calcium 9.1  8.4 - 09.6 mg/dL   Total Protein 7.5  6.0 - 8.3 g/dL   Albumin 3.2 (*) 3.5 - 5.2 g/dL   AST 63 (*) 0 - 37 U/L   ALT 50  0 - 53 U/L   Alkaline Phosphatase 91  39 - 117 U/L   Total Bilirubin 0.4  0.3 - 1.2 mg/dL   GFR calc non Af Amer 83 (*) >90 mL/min   GFR calc Af Amer >90  >90 mL/min   Comment: (NOTE)     The eGFR has been calculated using the CKD EPI equation.     This calculation has not been validated in all clinical situations.     eGFR's persistently <90 mL/min signify possible Chronic Kidney     Disease.  URINALYSIS, ROUTINE W REFLEX MICROSCOPIC     Status: Abnormal   Collection Time    04/27/13  7:13 PM      Result Value Range   Color, Urine YELLOW  YELLOW   APPearance CLOUDY (*) CLEAR   Specific Gravity, Urine 1.031 (*) 1.005 - 1.030   pH 5.0  5.0 - 8.0   Glucose, UA NEGATIVE  NEGATIVE mg/dL   Hgb urine dipstick NEGATIVE  NEGATIVE   Bilirubin Urine NEGATIVE  NEGATIVE   Ketones, ur 15 (*) NEGATIVE mg/dL   Protein, ur 045 (*) NEGATIVE mg/dL   Urobilinogen, UA 0.2  0.0 - 1.0 mg/dL   Nitrite NEGATIVE  NEGATIVE   Leukocytes, UA NEGATIVE  NEGATIVE  URINE MICROSCOPIC-ADD ON     Status: None   Collection Time     04/27/13  7:13 PM      Result Value Range   Squamous Epithelial / LPF RARE  RARE   WBC, UA 0-2  <3 WBC/hpf   RBC / HPF 0-2  <3 RBC/hpf   Bacteria, UA RARE  RARE   Urine-Other AMORPHOUS URATES/PHOSPHATES     US Abdomen Complete  04/27/2013   CLINICAL DATA:  Abdominal pain  EXAM: ULTRASOUND ABDOMEN COMPLETE  COMPARISON:  None.  FINDINGS: Gallbladder  There is sludge and there are tiny gallstones. Gallbladder wall thickness is within normal limits at 2.7 mm. There is no Murphy sign.  Common bile duct  Diameter: 4.8 mm  Liver  No focal lesion identified. Within normal limits in parenchymal echogenicity.  IVC  No abnormality visualized.  Pancreas  Visualized portion unremarkable.  Spleen  Size and appearance within normal limits.  Right Kidney  Length: 11.3 cm Echogenicity within normal limits. No mass or hydronephrosis visualized.  Left Kidney  Length: 10.3 cm Echogenicity within normal limits. No mass or hydronephrosis visualized.  Abdominal aorta  Visualized portions normal, although several portions are obscured by bowel gas.  IMPRESSION: Sludge and tiny gallbladder calculi.  No other abnormalities.   Electronically Signed   By: Esperanza Heir M.D.   On: 04/27/2013 16:38   Dg Abd Acute W/chest  04/27/2013   CLINICAL DATA:  Patient had a contrasted study at another facility yesterday, patient continues to have abdominal pain in the upper abdomen  EXAM: ACUTE ABDOMEN SERIES (ABDOMEN 2 VIEW & CHEST 1 VIEW)  COMPARISON:  04/27/2013 CT scan  FINDINGS: Heart size mildly enlarged. Nodular opacity measuring about 7 mm over the left upper lobe. Mild diffuse interstitial change likely chronic. No free air.  No abnormally dilated loops of bowel. No air-fluid levels. Oral  contrast is seen throughout the entire colon and IV contrast remains in the bladder from examination the patient reports having yesterday.  IMPRESSION: 1. Nonobstructive bowel gas pattern 2. Mild diffuse interstitial lung disease likely chronic.  Report of a possible nodule right lung base on CT abdomen pelvis performed 04/27/2013 noted, for which CT thorax is recommended. There is also evidence of a possible nodule over the left upper lobe for which CT thorax is suggested.   Electronically Signed   By: Esperanza Heir M.D.   On: 04/27/2013 19:01    Review of Systems  Constitutional: Positive for fever and malaise/fatigue. Negative for chills.  HENT: Negative.   Eyes: Negative.   Respiratory: Negative.   Cardiovascular: Negative.   Gastrointestinal: Positive for abdominal pain and diarrhea.  Genitourinary: Negative.   Musculoskeletal: Negative.   Skin: Negative.   Neurological: Positive for loss of consciousness.  Endo/Heme/Allergies: Negative.   Psychiatric/Behavioral: Negative.     Blood pressure 109/47, pulse 70, temperature 99.9 F (37.7 C), temperature source Oral, resp. rate 18, height 5\' 6"  (1.676 m), weight 153 lb (69.4 kg), SpO2 100.00%. Physical Exam  Constitutional: He is oriented to person, place, and time. He appears well-developed and well-nourished.  HENT:  Head: Normocephalic and atraumatic.  Eyes: Pupils are equal, round, and reactive to light. No scleral icterus.  Neck: Normal range of motion. Neck supple.  Cardiovascular: Normal rate and regular rhythm.   Respiratory: Effort normal and breath sounds normal.  GI: There is tenderness in the right upper quadrant and left upper quadrant. There is positive Murphy's sign. No hernia.  Neurological: He is alert and oriented to person, place, and time. He has normal strength. No cranial nerve deficit or sensory deficit.  Skin: Skin is warm and dry.  Psychiatric: He has a normal mood and affect. His behavior is normal. Judgment and thought content normal.     Assessment/Plan Acute cholecystitis  Hx of seizure disorder Questionable syncopal episode admit IVF/ABX/NPO after MN Lap chole tomorrow per Dr Scherry Ran A. 04/27/2013, 8:18 PM

## 2013-04-27 NOTE — ED Provider Notes (Signed)
CSN: [id number]     Arrival date & time 04/27/13  1401 History   First MD Initiated Contact with Patient 04/27/13 1424     Chief Complaint  Patient presents with  . Abdominal Pain   (Consider location/radiation/quality/duration/timing/severity/associated sxs/prior Treatment) HPI Comments: Troy Rodgers. is a 67 y.o. Male who presents for evaluation of abdominal pain. Pain has been present for 3 days and associated with anorexia and nausea, but no vomiting. His last bowel movement was 2 days ago. He was evaluated yesterday, with a CAT scan and told that he may have a gallbladder problem. He was advised to use narcotic pain relievers. The pain pill. He is taking, is not helping. He denies fever, cough, shortness of breath, or chest pain. Today, he felt weak, and fell. He did not injure himself in the fall. He came here for evaluation by EMS. His previous care was in [place], [place]. There are no other known modifying factors.  Patient is a 67 y.o. male presenting with abdominal pain. The history is provided by the patient.  Abdominal Pain   Past Medical History  Diagnosis Date  . Other and unspecified hyperlipidemia   . Depressive disorder, not elsewhere classified   . Localization-related (focal) (partial) epilepsy and epileptic syndromes with complex partial seizures, without mention of intractable epilepsy   . Kidney stone   . Impotence of organic origin   . External hemorrhoid   . Nasal septal perforation   . Cataract   . Hyperplasia of prostate   . Cholelithiases   . Chest pain   . Bradycardia   . Abdominal pain    Past Surgical History  Procedure Laterality Date  . Left shoulder surgery     Family History  Problem Relation Age of Onset  . Kidney failure Father     died age 59  . Alzheimer's disease Father     died age 31   History  Substance Use Topics  . Smoking status: Never Smoker   . Smokeless tobacco: Not on file  . Alcohol Use: No    Review of  Systems  Gastrointestinal: Positive for abdominal pain.  All other systems reviewed and are negative.    Allergies  Fentanyl and Hydrocodone  Home Medications   Current Outpatient Rx  Name  Route  Sig  Dispense  Refill  . aspirin 81 MG EC tablet   Oral   Take 81 mg by mouth daily.           Marland Kitchen atorvastatin (LIPITOR) 40 MG tablet   Oral   Take 40 mg by mouth daily.          . Cholecalciferol (VITAMIN D3) 2000 UNITS TABS   Oral   Take 1 tablet by mouth daily.           Marland Kitchen escitalopram (LEXAPRO) 20 MG tablet   Oral   Take 20 mg by mouth daily.           . fish oil-omega-3 fatty acids 1000 MG capsule   Oral   Take 1 g by mouth 2 (two) times daily.          . folic acid (FOLVITE) 1 MG tablet   Oral   Take 1 mg by mouth 2 (two) times daily.          Marland Kitchen HYDROcodone-acetaminophen (NORCO/VICODIN) 5-325 MG per tablet   Oral   Take 1 tablet by mouth every 6 (six) hours as needed for pain.         Marland Kitchen  lacosamide (VIMPAT) 200 MG TABS tablet   Oral   Take 200 mg by mouth 2 (two) times daily.         Marland Kitchen levothyroxine (SYNTHROID, LEVOTHROID) 50 MCG tablet   Oral   Take 50 mcg by mouth daily.         . methotrexate (RHEUMATREX) 5 MG tablet   Oral   Take 10 mg by mouth once a week. Caution: Chemotherapy. Protect from light.         Marland Kitchen omeprazole (PRILOSEC) 20 MG capsule   Oral   Take 40 mg by mouth daily.          . primidone (MYSOLINE) 250 MG tablet   Oral   Take 250 mg by mouth 3 (three) times daily.          Marland Kitchen ZETIA 10 MG tablet      TAKE ONE TABLET BY MOUTH DAILY FOR HYPERLIPIDERMEA   90 tablet   1    BP 109/47  Pulse 70  Temp(Src) 99.9 F (37.7 C) (Oral)  Resp 18  Ht 5\' 6"  (1.676 m)  Wt 153 lb (69.4 kg)  BMI 24.71 kg/m2  SpO2 100% Physical Exam  Nursing note and vitals reviewed. Constitutional: He is oriented to person, place, and time. He appears well-developed.  Elderly, appears older than stated age.  HENT:  Head: Normocephalic  and atraumatic.  Right Ear: External ear normal.  Left Ear: External ear normal.  Eyes: Conjunctivae and EOM are normal. Pupils are equal, round, and reactive to light.  Neck: Normal range of motion and phonation normal. Neck supple.  Cardiovascular: Normal rate, regular rhythm, normal heart sounds and intact distal pulses.   Pulmonary/Chest: Effort normal and breath sounds normal. He exhibits no bony tenderness.  Abdominal: Soft. Normal appearance. He exhibits no mass. There is tenderness. There is no rebound and no guarding.  Hyperactive bowel sounds, soft, mild distention, mild, diffuse tenderness.  Musculoskeletal: Normal range of motion. He exhibits no edema and no tenderness.  Neurological: He is alert and oriented to person, place, and time. No cranial nerve deficit or sensory deficit. He exhibits normal muscle tone. Coordination normal.  Skin: Skin is warm, dry and intact.  Psychiatric: He has a normal mood and affect. His behavior is normal. Judgment and thought content normal.    ED Course  Procedures (including critical care time) Medications  0.9 %  sodium chloride infusion ( Intravenous New Bag/Given 04/27/13 1944)  sodium chloride 0.9 % bolus 1,000 mL (0 mLs Intravenous Stopped 04/27/13 1846)  fentaNYL (SUBLIMAZE) injection 100 mcg (100 mcg Intravenous Given 04/27/13 1629)  ondansetron (ZOFRAN) injection 4 mg (4 mg Intravenous Given 04/27/13 1630)    Patient Vitals for the past 24 hrs:  BP Temp Temp src Pulse Resp SpO2 Height Weight  04/27/13 1645 109/47 mmHg - - 70 18 100 % - -  04/27/13 1645 123/55 mmHg - - 67 23 100 % - -  04/27/13 1630 109/47 mmHg - - 51 - 99 % - -  04/27/13 1515 100/34 mmHg - - 52 - 95 % - -  04/27/13 1500 118/43 mmHg - - 66 - 96 % - -  04/27/13 1445 110/46 mmHg - - 64 - 96 % - -  04/27/13 1430 108/41 mmHg - - 64 - 97 % - -  04/27/13 1415 90/51 mmHg - - 64 - 97 % - -  04/27/13 1408 - - - - - - 5\' 6"  (1.676 m) 153 lb (69.4  kg)  04/27/13 1405  116/47 mmHg 99.9 F (37.7 C) Oral 73 18 97 % - -   1645- on treatment with fentanyl. He had transient oxygen desaturation, that improved with nasal cannula oxygen, and stimulation.  42- patient's wife is not here. She is able to give additional history, that the patient had an elevated temperature today at home of 101.5. She also relates that the patient had a episode of passing out and respiratory discomfort that improved when she stimulated him. She feels it was because of medication he received last night while he is evaluated at the outlying emergency department. I discussed the CT and x-ray findings of pulmonary nodules, and the wife understands that he will need to have this followed up, within 3 months as an outpatient.  7:52 PM-Consult complete with Dr Luisa Hart. Patient case explained and discussed. He agrees to admit patient for further evaluation and treatment. He would like the patient evaluated medically for a syncopal episode, prior to expected surgery tomorrow. Call ended at 1956  8:04 PM-Consult complete with FPTS resident.  Patient case explained and discussed. She agrees to evaluate patient for further surgical clearence and treatment as needed. Call ended at 2008 Labs Review Labs Reviewed  CBC WITH DIFFERENTIAL - Abnormal; Notable for the following:    WBC 20.7 (*)    RBC 3.60 (*)    HCT 36.0 (*)    MCH 36.1 (*)    MCHC 36.1 (*)    Neutro Abs 15.5 (*)    Lymphocytes Relative 7 (*)    Monocytes Relative 18 (*)    Monocytes Absolute 3.7 (*)    All other components within normal limits  COMPREHENSIVE METABOLIC PANEL - Abnormal; Notable for the following:    Sodium 131 (*)    Chloride 93 (*)    Glucose, Bld 147 (*)    Albumin 3.2 (*)    AST 63 (*)    GFR calc non Af Amer 83 (*)    All other components within normal limits  URINALYSIS, ROUTINE W REFLEX MICROSCOPIC - Abnormal; Notable for the following:    APPearance CLOUDY (*)    Specific Gravity, Urine 1.031 (*)     Ketones, ur 15 (*)    Protein, ur 100 (*)    All other components within normal limits  URINE CULTURE  URINE MICROSCOPIC-ADD ON   Imaging Review US Abdomen Complete  04/27/2013   CLINICAL DATA:  Abdominal pain  EXAM: ULTRASOUND ABDOMEN COMPLETE  COMPARISON:  None.  FINDINGS: Gallbladder  There is sludge and there are tiny gallstones. Gallbladder wall thickness is within normal limits at 2.7 mm. There is no Murphy sign.  Common bile duct  Diameter: 4.8 mm  Liver  No focal lesion identified. Within normal limits in parenchymal echogenicity.  IVC  No abnormality visualized.  Pancreas  Visualized portion unremarkable.  Spleen  Size and appearance within normal limits.  Right Kidney  Length: 11.3 cm Echogenicity within normal limits. No mass or hydronephrosis visualized.  Left Kidney  Length: 10.3 cm Echogenicity within normal limits. No mass or hydronephrosis visualized.  Abdominal aorta  Visualized portions normal, although several portions are obscured by bowel gas.  IMPRESSION: Sludge and tiny gallbladder calculi.  No other abnormalities.   Electronically Signed   By: Esperanza Heir M.D.   On: 04/27/2013 16:38   Dg Abd Acute W/chest  04/27/2013   CLINICAL DATA:  Patient had a contrasted study at another facility yesterday, patient continues to have abdominal pain  in the upper abdomen  EXAM: ACUTE ABDOMEN SERIES (ABDOMEN 2 VIEW & CHEST 1 VIEW)  COMPARISON:  04/27/2013 CT scan  FINDINGS: Heart size mildly enlarged. Nodular opacity measuring about 7 mm over the left upper lobe. Mild diffuse interstitial change likely chronic. No free air.  No abnormally dilated loops of bowel. No air-fluid levels. Oral contrast is seen throughout the entire colon and IV contrast remains in the bladder from examination the patient reports having yesterday.  IMPRESSION: 1. Nonobstructive bowel gas pattern 2. Mild diffuse interstitial lung disease likely chronic. Report of a possible nodule right lung base on CT abdomen  pelvis performed 04/27/2013 noted, for which CT thorax is recommended. There is also evidence of a possible nodule over the left upper lobe for which CT thorax is suggested.   Electronically Signed   By: Esperanza Heir M.D.   On: 04/27/2013 19:01    EKG Interpretation     Ventricular Rate:  52 PR Interval:  276 QRS Duration: 103 QT Interval:  393 QTC Calculation: 366 R Axis:   -71 Text Interpretation:  Sinus rhythm Atrial premature complex Prolonged PR interval Left ventricular hypertrophy Anterolateral infarct, age indeterminate  Since last tracing 04/22/12 . PR interval is longer and new LVH            MDM   1. Abdominal pain   2. Cholelithiasis   3. Lung nodule   4. Syncope    Abdominal pain, with fever, and elevated white blood cell count. Abdominal ultrasound indicates cholelithiasis and sludge, without other complicating features. Recent CT scan was otherwise reassuring with the exception of pulmonary nodules. These were discussed with the family members and they, understand he'll need outpatient followup, for them.  Nursing Notes Reviewed/ Care Coordinated, and agree without changes. Applicable Imaging Reviewed.  Interpretation of Laboratory Data incorporated into ED treatment   Plan:: Assessment by Gen. surgery in emergency department for possible cholecystectomy. Assessment by medical service for medical clearance for surgery.    Flint Melter, MD 04/27/13 2022

## 2013-04-27 NOTE — ED Notes (Signed)
EDP Wentz at bedside 

## 2013-04-27 NOTE — ED Notes (Signed)
Patient transported to X-ray 

## 2013-04-27 NOTE — ED Notes (Signed)
Family at bedside, updated on pt's condition after desating

## 2013-04-27 NOTE — Telephone Encounter (Signed)
Please get stat ultrasound of abdomen to include gallbladder

## 2013-04-27 NOTE — Consult Note (Signed)
Date: 04/27/2013                  Patient Name:  Troy Rodgers.  MRN: [id number]  DOB: 1946/05/09  Age / Sex: 67 y.o., male   PCP: Rudi Heap                 Referring Physician: Dr. Luisa Hart                 Reason for Consult: Medical management            History of Present Illness: Patient is a 67 y.o. male with history of epilepsy (controlled, on lacsamide), rheumatoid arthritis (well controlled on low dose methotrexate), mild hypothyroidism, HL who was admitted to [facility] on 04/27/2013 for evaluation of abdominal pain.  Dr. Luisa Hart of general surgery evaluated patient, diagnosed with acute cholecystitis; pt scheduled for laparoscopic cholecystecyomy with Dr. Derrell Lolling tomorrow.     Patient was rather drowsy during interview and exam, only able to obtain limited amount of information.  Patient states he began having RUQ abdominal pain on ?Monday evening.  He went to the ED in [place], [place] and was sent home with pain medicines which did not seem to help.  CT abdomen reportedly showed gallbladder disease.  He returned today (10/14) with continued pain and subjective fevers.   He has a history of seizures, no "major ones" in past several years.  He does have 5-10 second "short seizures" about twice per month. He follows regularly with neurology.   He is active at home, walks about 2 miles every day.  Denies smoking, EtOH, illicits.   Denies chest pain, shortness of breath, n/v/d, weakness.  Denies personal cardiac history; no history of diabetes or HTN.     Review of Systems  Constitutional: Positive for fever. Negative for chills and weight loss.  Eyes: Negative for blurred vision.  Respiratory: Negative for cough and shortness of breath.   Cardiovascular: Negative for chest pain and leg swelling.  Gastrointestinal: Positive for abdominal pain. Negative for nausea, vomiting, diarrhea, constipation and blood in stool.  Genitourinary: Negative for dysuria.  Musculoskeletal: Negative  for falls and myalgias.  Neurological: Negative for dizziness, tingling, loss of consciousness and headaches.    Medications: Outpatient medications: Prescriptions prior to admission  Medication Sig Dispense Refill  . aspirin 81 MG EC tablet Take 81 mg by mouth daily.        Marland Kitchen atorvastatin (LIPITOR) 40 MG tablet Take 40 mg by mouth daily.       . Cholecalciferol (VITAMIN D3) 2000 UNITS TABS Take 1 tablet by mouth daily.        Marland Kitchen escitalopram (LEXAPRO) 20 MG tablet Take 20 mg by mouth daily.        . fish oil-omega-3 fatty acids 1000 MG capsule Take 1 g by mouth 2 (two) times daily.       . folic acid (FOLVITE) 1 MG tablet Take 1 mg by mouth 2 (two) times daily.       Marland Kitchen HYDROcodone-acetaminophen (NORCO/VICODIN) 5-325 MG per tablet Take 1 tablet by mouth every 6 (six) hours as needed for pain.      Marland Kitchen lacosamide (VIMPAT) 200 MG TABS tablet Take 200 mg by mouth 2 (two) times daily.      Marland Kitchen levothyroxine (SYNTHROID, LEVOTHROID) 50 MCG tablet Take 50 mcg by mouth daily.      . methotrexate (RHEUMATREX) 5 MG tablet Take 10 mg by mouth once a week.  Caution: Chemotherapy. Protect from light.      Marland Kitchen omeprazole (PRILOSEC) 20 MG capsule Take 40 mg by mouth daily.       . primidone (MYSOLINE) 250 MG tablet Take 250 mg by mouth 3 (three) times daily.       Marland Kitchen ZETIA 10 MG tablet TAKE ONE TABLET BY MOUTH DAILY FOR HYPERLIPIDERMEA  90 tablet  1    Current medications: Current Facility-Administered Medications  Medication Dose Route Frequency Provider Last Rate Last Dose  . 0.9 %  sodium chloride infusion   Intravenous Continuous Flint Melter, MD 125 mL/hr at 04/27/13 1944    . ciprofloxacin (CIPRO) IVPB 400 mg  400 mg Intravenous Q12H Thomas A. Cornett, MD      . lacosamide (VIMPAT) tablet 200 mg  200 mg Oral BID Thomas A. Cornett, MD      . primidone (MYSOLINE) tablet 250 mg  250 mg Oral TID Thomas A. Cornett, MD          Allergies: Allergies  Allergen Reactions  . Fentanyl Other (See Comments)      In high doses of 100 mcg pt's O2 sats decrease to 60% RA  . Hydrocodone     Intolerance       Past Medical History: Past Medical History  Diagnosis Date  . Other and unspecified hyperlipidemia   . Depressive disorder, not elsewhere classified   . Localization-related (focal) (partial) epilepsy and epileptic syndromes with complex partial seizures, without mention of intractable epilepsy   . Kidney stone   . Impotence of organic origin   . External hemorrhoid   . Nasal septal perforation   . Cataract   . Hyperplasia of prostate   . Cholelithiases   . Chest pain   . Bradycardia   . Abdominal pain      Past Surgical History: Past Surgical History  Procedure Laterality Date  . Left shoulder surgery       Family History: Family History  Problem Relation Age of Onset  . Kidney failure Father     died age 71  . Alzheimer's disease Father     died age 56     Social History: History   Social History  . Marital Status: Married    Spouse Name: N/A    Number of Children: 2  . Years of Education: N/A   Occupational History  . Retired    Social History Main Topics  . Smoking status: Never Smoker   . Smokeless tobacco: Not on file  . Alcohol Use: No  . Drug Use: No  . Sexual Activity: Not on file   Other Topics Concern  . Not on file   Social History Narrative   Lives with wife.       Vital Signs: Blood pressure 135/53, pulse 70, temperature 98.9 F (37.2 C), temperature source Oral, resp. rate 18, height 5\' 6"  (1.676 m), weight 153 lb (69.4 kg), SpO2 99.00%. General: pt very drowsy but arousable HEENT: NCAT, vision grossly intact, oropharynx clear and non-erythematous  Neck: supple, no lymphadenopathy Lungs: clear to ascultation bilaterally, normal work of respiration, no wheezes, rales, ronchi Heart: regular rate and rhythm, no murmurs, gallops, or rubs Abdomen: TTP in RUQ, soft, non-distended, normal bowel sounds Extremities: 2+ DP/PT pulses  bilaterally, no cyanosis, clubbing, or edema Neurologic: unable to complete 2/2 pt's mental status   Lab results: Metabolic Panel:  Recent Labs Lab 04/27/13 1506  NA 131*  K 4.3  CL 93*  CO2 29  GLUCOSE 147*  BUN 10  CREATININE 0.98  CALCIUM 9.1    Liver Function Tests:  Recent Labs Lab 04/27/13 1506  AST 63*  ALT 50  ALKPHOS 91  BILITOT 0.4  PROT 7.5  ALBUMIN 3.2*    CBC:  Recent Labs Lab 04/27/13 1506  WBC 20.7*  NEUTROABS 15.5*  HGB 13.0  HCT 36.0*  MCV 100.0  PLT 262    Other results: EKG- sinus bradycardia, normal axis, mild PR prolongation (unchanged from prior), ?LVH, no ST or T wave changes   Imaging: US Abdomen Complete  04/27/2013   CLINICAL DATA:  Abdominal pain  EXAM: ULTRASOUND ABDOMEN COMPLETE  COMPARISON:  None.  FINDINGS: Gallbladder  There is sludge and there are tiny gallstones. Gallbladder wall thickness is within normal limits at 2.7 mm. There is no Murphy sign.  Common bile duct  Diameter: 4.8 mm  Liver  No focal lesion identified. Within normal limits in parenchymal echogenicity.  IVC  No abnormality visualized.  Pancreas  Visualized portion unremarkable.  Spleen  Size and appearance within normal limits.  Right Kidney  Length: 11.3 cm Echogenicity within normal limits. No mass or hydronephrosis visualized.  Left Kidney  Length: 10.3 cm Echogenicity within normal limits. No mass or hydronephrosis visualized.  Abdominal aorta  Visualized portions normal, although several portions are obscured by bowel gas.  IMPRESSION: Sludge and tiny gallbladder calculi.  No other abnormalities.   Electronically Signed   By: Esperanza Heir M.D.   On: 04/27/2013 16:38   Dg Abd Acute W/chest  04/27/2013   CLINICAL DATA:  Patient had a contrasted study at another facility yesterday, patient continues to have abdominal pain in the upper abdomen  EXAM: ACUTE ABDOMEN SERIES (ABDOMEN 2 VIEW & CHEST 1 VIEW)  COMPARISON:  04/27/2013 CT scan  FINDINGS: Heart size  mildly enlarged. Nodular opacity measuring about 7 mm over the left upper lobe. Mild diffuse interstitial change likely chronic. No free air.  No abnormally dilated loops of bowel. No air-fluid levels. Oral contrast is seen throughout the entire colon and IV contrast remains in the bladder from examination the patient reports having yesterday.  IMPRESSION: 1. Nonobstructive bowel gas pattern 2. Mild diffuse interstitial lung disease likely chronic. Report of a possible nodule right lung base on CT abdomen pelvis performed 04/27/2013 noted, for which CT thorax is recommended. There is also evidence of a possible nodule over the left upper lobe for which CT thorax is suggested.   Electronically Signed   By: Esperanza Heir M.D.   On: 04/27/2013 19:01      Assessment & Plan: # Estimation of risk of surgery - Using [facility] criteria patient gets 1 point for emergent procedure that will be intraperitoneal however no other risks and estimated cardiac risk of surgery 1%. ECHO from 2013 without acute findings and normal cardiac function. EKG reviewed from today and without significant change from EKG 1 year ago.   # Episode of confusion with fall/syncope - Patient very drowsy on our exam and cannot exclude seizure although family related that episode not typical for his seizure pattern. Most likely etiology is pain medication and supported by the fact that exposure to fentanyl in ED caused severe reaction with non-responsive episode (with hypoxia and hypotension) which resolved within minutes. EKG without arrhythmias or acute changes from previous; no signs of ischemia.   # Abdominal pain - Patient does relate right quadrant tenderness and plan is for cholecystectomy in the AM. WBC 20.7.  Patient currently  on ciprofloxacin for presumed cholecystitis.   # Hypothyroidism - Patient does appear to be on synthroid at home and verbalizes that he has thryroid dysfunction. Now is not an appropriate time to check TSH  given acute stressor. Will recommend continuing  synthroid 50 mcg daily (if NPO, conversion to IV dosing is 1/2 normal dose and would be synthroid IV 25 mcg daily).   # Hyperlipidemia - Last LDL was 75 and would recommend continuing therapy with statin. Would hold Zetia while NPO.   # Seizure disorder - Patient is on vimpat 200 mg BID at home and does not sound like he has had new seizure at home recently. He has not had grand mal seizure in several years but does have small (<5-10 sec) seizures at home a couple of times per month.   # Recommendations - as above, if NPO prolonged will need conversion of medications to IV. Will follow along for any developments.   #DVT PPX- lovenox (starting post-op)  DISPO - Disposition is deferred at this time, pending surgery tomorrow. Discharge to be determined by primary team.  . Signed: Rocco Serene, MD  PGY-1, Internal Medicine Resident 04/27/2013, 9:08 PM    Weekday Hours (7AM-5PM):  1st Contact: Dr. Claudell Kyle Pager:[phone]  2nd Contact: Dr. Bosie Clos Pager:[phone]  ** If no return call within 15 minutes (after trying both pagers listed above), please call after hours pagers.  After 5 pm or weekends:  1st Contact: Pager: [phone]  2nd Contact: Pager: [phone]   Attending addendum: It appears that Mr. Morici has acute cholecystitis. He remains exquisitely tender in the right upper quadrant on exam this morning. He did have an episode of transient hypotension and hypoxia in the emergency department and was febrile overnight. He appears to have some relative oversedation overnight due to his pain medication which has resolved. We will obtain blood cultures now and broaden his antibiotic therapy to include ciprofloxacin and metronidazole pending surgery later today.  Cliffton Asters, MD [facility] [phone] pager   [phone] cell 04/28/2013, 12:46 PM

## 2013-04-27 NOTE — ED Notes (Signed)
Pt stated that he has been exp abdominal pain for a few days. Pt stated that his last bowel movement was a couple of days ago. Pt said he has had some nausea but denies any vomiting. EMS stated pt has been running a low grade temp for the past few days. Pt was seen at [facility] yesterday but they could not determine what was wrong with pt. Pt's family wanted pt to be seen her. No s/s of distress noted. Pt's abdomen is distended but no pain with palpation. Will monitor.

## 2013-04-28 ENCOUNTER — Observation Stay (HOSPITAL_COMMUNITY): Payer: Medicare Other | Admitting: Anesthesiology

## 2013-04-28 ENCOUNTER — Encounter (HOSPITAL_COMMUNITY): Payer: Medicare Other | Admitting: Anesthesiology

## 2013-04-28 ENCOUNTER — Encounter (HOSPITAL_COMMUNITY)
Admission: EM | Disposition: A | Discharge status: Home / Self Care | Payer: Self-pay | Source: Home / Self Care | Attending: Emergency Medicine

## 2013-04-28 DIAGNOSIS — R109 Unspecified abdominal pain: Secondary | ICD-10-CM

## 2013-04-28 DIAGNOSIS — E785 Hyperlipidemia, unspecified: Secondary | ICD-10-CM

## 2013-04-28 DIAGNOSIS — R569 Unspecified convulsions: Secondary | ICD-10-CM

## 2013-04-28 DIAGNOSIS — R55 Syncope and collapse: Secondary | ICD-10-CM

## 2013-04-28 DIAGNOSIS — K811 Chronic cholecystitis: Secondary | ICD-10-CM

## 2013-04-28 DIAGNOSIS — K810 Acute cholecystitis: Secondary | ICD-10-CM | POA: Diagnosis present

## 2013-04-28 DIAGNOSIS — E039 Hypothyroidism, unspecified: Secondary | ICD-10-CM

## 2013-04-28 HISTORY — PX: CHOLECYSTECTOMY: SHX55

## 2013-04-28 LAB — CBC
HCT: 35.9 % — ABNORMAL LOW (ref 39.0–52.0)
Hemoglobin: 12.3 g/dL — ABNORMAL LOW (ref 13.0–17.0)
Hemoglobin: 12.7 g/dL — ABNORMAL LOW (ref 13.0–17.0)
MCH: 35.4 pg — ABNORMAL HIGH (ref 26.0–34.0)
MCH: 35.8 pg — ABNORMAL HIGH (ref 26.0–34.0)
MCHC: 35.4 g/dL (ref 30.0–36.0)
MCV: 101.2 fL — ABNORMAL HIGH (ref 78.0–100.0)
Platelets: 268 10*3/uL (ref 150–400)
RBC: 3.47 MIL/uL — ABNORMAL LOW (ref 4.22–5.81)
RBC: 3.55 MIL/uL — ABNORMAL LOW (ref 4.22–5.81)
RDW: 13.4 % (ref 11.5–15.5)
WBC: 20.6 10*3/uL — ABNORMAL HIGH (ref 4.0–10.5)

## 2013-04-28 LAB — COMPREHENSIVE METABOLIC PANEL
ALT: 36 U/L (ref 0–53)
AST: 54 U/L — ABNORMAL HIGH (ref 0–37)
Albumin: 2.9 g/dL — ABNORMAL LOW (ref 3.5–5.2)
Alkaline Phosphatase: 79 U/L (ref 39–117)
CO2: 27 mEq/L (ref 19–32)
Chloride: 94 mEq/L — ABNORMAL LOW (ref 96–112)
Creatinine, Ser: 1.06 mg/dL (ref 0.50–1.35)
GFR calc Af Amer: 82 mL/min — ABNORMAL LOW (ref >90)
GFR calc non Af Amer: 71 mL/min — ABNORMAL LOW (ref >90)
Glucose, Bld: 118 mg/dL — ABNORMAL HIGH (ref 70–99)
Potassium: 4.1 mEq/L (ref 3.5–5.1)
Sodium: 132 mEq/L — ABNORMAL LOW (ref 135–145)
Total Bilirubin: 0.6 mg/dL (ref 0.3–1.2)
Total Protein: 7 g/dL (ref 6.0–8.3)

## 2013-04-28 LAB — SURGICAL PCR SCREEN
MRSA, PCR: NEGATIVE
Staphylococcus aureus: POSITIVE — AB

## 2013-04-28 LAB — CREATININE, SERUM
Creatinine, Ser: 0.97 mg/dL (ref 0.50–1.35)
GFR calc Af Amer: >90 mL/min (ref >90)
GFR calc non Af Amer: 84 mL/min — ABNORMAL LOW (ref >90)

## 2013-04-28 SURGERY — LAPAROSCOPIC CHOLECYSTECTOMY
Anesthesia: General | Site: Abdomen | Wound class: Dirty or Infected

## 2013-04-28 MED ORDER — FENTANYL CITRATE 0.05 MG/ML IJ SOLN
INTRAMUSCULAR | Status: DC | PRN
  Administered 2013-04-28: 100 ug via INTRAVENOUS
  Administered 2013-04-28 (×2): 50 ug via INTRAVENOUS

## 2013-04-28 MED ORDER — LIDOCAINE HCL (CARDIAC) 20 MG/ML IV SOLN
INTRAVENOUS | Status: DC | PRN
  Administered 2013-04-28: 40 mg via INTRAVENOUS

## 2013-04-28 MED ORDER — NEOSTIGMINE METHYLSULFATE 1 MG/ML IJ SOLN
INTRAMUSCULAR | Status: DC | PRN
  Administered 2013-04-28: 1 mg via INTRAVENOUS
  Administered 2013-04-28: 4 mg via INTRAVENOUS

## 2013-04-28 MED ORDER — HYDROMORPHONE HCL PF 1 MG/ML IJ SOLN
INTRAMUSCULAR | Status: AC
  Filled 2013-04-28: qty 1

## 2013-04-28 MED ORDER — HYDROMORPHONE HCL PF 1 MG/ML IJ SOLN
0.2500 mg | INTRAMUSCULAR | Status: DC | PRN
  Administered 2013-04-28: 0.5 mg via INTRAVENOUS

## 2013-04-28 MED ORDER — LACTATED RINGERS IV SOLN
INTRAVENOUS | Status: DC
  Administered 2013-04-28: 50 mL/h via INTRAVENOUS

## 2013-04-28 MED ORDER — ENOXAPARIN SODIUM 40 MG/0.4ML ~~LOC~~ SOLN
40.0000 mg | SUBCUTANEOUS | Status: DC
  Administered 2013-04-29: 40 mg via SUBCUTANEOUS
  Filled 2013-04-28 (×2): qty 0.4

## 2013-04-28 MED ORDER — CIPROFLOXACIN IN D5W 400 MG/200ML IV SOLN
400.0000 mg | Freq: Two times a day (BID) | INTRAVENOUS | Status: AC
  Administered 2013-04-28 – 2013-04-29 (×2): 400 mg via INTRAVENOUS
  Filled 2013-04-28 (×2): qty 200

## 2013-04-28 MED ORDER — ONDANSETRON HCL 4 MG/2ML IJ SOLN
INTRAMUSCULAR | Status: DC | PRN
  Administered 2013-04-28: 4 mg via INTRAMUSCULAR

## 2013-04-28 MED ORDER — ONDANSETRON HCL 4 MG/2ML IJ SOLN: 4.0000 mg | Freq: Once | INTRAMUSCULAR | Status: DC | PRN

## 2013-04-28 MED ORDER — LACTATED RINGERS IV SOLN
INTRAVENOUS | Status: DC | PRN
  Administered 2013-04-28 (×2): via INTRAVENOUS

## 2013-04-28 MED ORDER — SODIUM CHLORIDE 0.9 % IR SOLN
Status: DC | PRN
  Administered 2013-04-28 (×2): 1

## 2013-04-28 MED ORDER — OXYCODONE-ACETAMINOPHEN 5-325 MG PO TABS: 1.0000 | ORAL_TABLET | ORAL | Status: DC | PRN

## 2013-04-28 MED ORDER — BUPIVACAINE HCL 0.25 % IJ SOLN
INTRAMUSCULAR | Status: DC | PRN
  Administered 2013-04-28: 4 mL

## 2013-04-28 MED ORDER — ROCURONIUM BROMIDE 100 MG/10ML IV SOLN
INTRAVENOUS | Status: DC | PRN
  Administered 2013-04-28: 50 mg via INTRAVENOUS

## 2013-04-28 MED ORDER — BUPIVACAINE HCL (PF) 0.25 % IJ SOLN
INTRAMUSCULAR | Status: AC
  Filled 2013-04-28: qty 30

## 2013-04-28 MED ORDER — ACETAMINOPHEN 325 MG PO TABS
650.0000 mg | ORAL_TABLET | Freq: Four times a day (QID) | ORAL | Status: DC | PRN
  Administered 2013-04-28 – 2013-04-29 (×3): 650 mg via ORAL
  Filled 2013-04-28 (×3): qty 2

## 2013-04-28 MED ORDER — MIDAZOLAM HCL 5 MG/5ML IJ SOLN
INTRAMUSCULAR | Status: DC | PRN
  Administered 2013-04-28: 2 mg via INTRAVENOUS

## 2013-04-28 MED ORDER — GLYCOPYRROLATE 0.2 MG/ML IJ SOLN
INTRAMUSCULAR | Status: DC | PRN
  Administered 2013-04-28: 0.6 mg via INTRAVENOUS
  Administered 2013-04-28: 0.2 mg via INTRAVENOUS

## 2013-04-28 MED ORDER — METRONIDAZOLE IN NACL 5-0.79 MG/ML-% IV SOLN
500.0000 mg | Freq: Three times a day (TID) | INTRAVENOUS | Status: DC
  Filled 2013-04-28 (×3): qty 100

## 2013-04-28 MED ORDER — 0.9 % SODIUM CHLORIDE (POUR BTL) OPTIME
TOPICAL | Status: DC | PRN
  Administered 2013-04-28: 1000 mL

## 2013-04-28 MED ORDER — PROPOFOL 10 MG/ML IV BOLUS
INTRAVENOUS | Status: DC | PRN
  Administered 2013-04-28: 150 mg via INTRAVENOUS

## 2013-04-28 MED ORDER — METRONIDAZOLE IN NACL 5-0.79 MG/ML-% IV SOLN
500.0000 mg | Freq: Three times a day (TID) | INTRAVENOUS | Status: AC
  Administered 2013-04-28 – 2013-04-29 (×2): 500 mg via INTRAVENOUS
  Filled 2013-04-28 (×3): qty 100

## 2013-04-28 SURGICAL SUPPLY — 47 items
APL SKNCLS STERI-STRIP NONHPOA (GAUZE/BANDAGES/DRESSINGS) ×2
APPLIER CLIP 5 13 M/L LIGAMAX5 (MISCELLANEOUS)
APR CLP MED LRG 5 ANG JAW (MISCELLANEOUS)
BAG SPEC RTRVL LRG 6X4 10 (ENDOMECHANICALS)
BENZOIN TINCTURE PRP APPL 2/3 (GAUZE/BANDAGES/DRESSINGS) ×3 IMPLANT
CANISTER SUCTION 2500CC (MISCELLANEOUS) ×3 IMPLANT
CHLORAPREP W/TINT 26ML (MISCELLANEOUS) ×3 IMPLANT
CLIP APPLIE 5 13 M/L LIGAMAX5 (MISCELLANEOUS) IMPLANT
CLIP LIGATING HEMO O LOK GREEN (MISCELLANEOUS) ×3 IMPLANT
COVER MAYO STAND STRL (DRAPES) ×3 IMPLANT
COVER SURGICAL LIGHT HANDLE (MISCELLANEOUS) ×3 IMPLANT
COVER TRANSDUCER ULTRASND (DRAPES) IMPLANT
DEVICE TROCAR PUNCTURE CLOSURE (ENDOMECHANICALS) ×3 IMPLANT
DRAPE C-ARM 42X72 X-RAY (DRAPES) ×3 IMPLANT
DRAPE UTILITY 15X26 W/TAPE STR (DRAPE) ×6 IMPLANT
ELECT REM PT RETURN 9FT ADLT (ELECTROSURGICAL) ×3
ELECTRODE REM PT RTRN 9FT ADLT (ELECTROSURGICAL) ×2 IMPLANT
GAUZE SPONGE 2X2 8PLY STRL LF (GAUZE/BANDAGES/DRESSINGS) ×2 IMPLANT
GLOVE BIO SURGEON STRL SZ7.5 (GLOVE) ×5 IMPLANT
GLOVE BIOGEL PI IND STRL 7.0 (GLOVE) ×1 IMPLANT
GLOVE BIOGEL PI IND STRL 7.5 (GLOVE) ×1 IMPLANT
GLOVE BIOGEL PI INDICATOR 7.0 (GLOVE) ×1
GLOVE BIOGEL PI INDICATOR 7.5 (GLOVE) ×1
GLOVE SS BIOGEL STRL SZ 6.5 (GLOVE) ×1 IMPLANT
GLOVE SUPERSENSE BIOGEL SZ 6.5 (GLOVE) ×1
GOWN STRL NON-REIN LRG LVL3 (GOWN DISPOSABLE) ×9 IMPLANT
GOWN STRL REIN XL XLG (GOWN DISPOSABLE) ×3 IMPLANT
IV CATH 14GX2 1/4 (CATHETERS) ×3 IMPLANT
KIT BASIN OR (CUSTOM PROCEDURE TRAY) ×3 IMPLANT
KIT ROOM TURNOVER OR (KITS) ×3 IMPLANT
NDL INSUFFLATION 14GA 120MM (NEEDLE) ×1 IMPLANT
NEEDLE INSUFFLATION 14GA 120MM (NEEDLE) ×3 IMPLANT
NS IRRIG 1000ML POUR BTL (IV SOLUTION) ×3 IMPLANT
PAD ARMBOARD 7.5X6 YLW CONV (MISCELLANEOUS) ×6 IMPLANT
POUCH SPECIMEN RETRIEVAL 10MM (ENDOMECHANICALS) IMPLANT
SCISSORS LAP 5X35 DISP (ENDOMECHANICALS) ×3 IMPLANT
SET CHOLANGIOGRAPHY FRANKLIN (SET/KITS/TRAYS/PACK) ×3 IMPLANT
SET IRRIG TUBING LAPAROSCOPIC (IRRIGATION / IRRIGATOR) ×3 IMPLANT
SLEEVE ENDOPATH XCEL 5M (ENDOMECHANICALS) ×3 IMPLANT
SPECIMEN JAR SMALL (MISCELLANEOUS) ×3 IMPLANT
SPONGE GAUZE 2X2 STER 10/PKG (GAUZE/BANDAGES/DRESSINGS) ×1
SUT MNCRL AB 3-0 PS2 18 (SUTURE) ×3 IMPLANT
TOWEL OR 17X24 6PK STRL BLUE (TOWEL DISPOSABLE) ×3 IMPLANT
TOWEL OR 17X26 10 PK STRL BLUE (TOWEL DISPOSABLE) ×3 IMPLANT
TRAY LAPAROSCOPIC (CUSTOM PROCEDURE TRAY) ×3 IMPLANT
TROCAR XCEL NON-BLD 11X100MML (ENDOMECHANICALS) ×3 IMPLANT
TROCAR XCEL NON-BLD 5MMX100MML (ENDOMECHANICALS) ×3 IMPLANT

## 2013-04-28 NOTE — Op Note (Signed)
Pre Operative Diagnosis: acute cholecystitis  Post Operative Diagnosis: acute gangrenous gallbladder  Surgeon: Dr. Axel Filler   Procedure: Lap chole  Assistant: none  Anesthesia: Gen. Endotracheal anesthesia   EBL: 10cc  Complications:  Counts: reported as correct x 2   Findings: The patient had acute inflammed and partially necrotic gallbladder.  Indications for procedure: pt is a 67 y/o M with 3d h/o abd pain in RUQ.  Pt with sign of acute cholecystitis and was taken urgently to the OR for a lap chole.  Details of the procedure:  The patient was taken to the operating and placed in the supine position with bilateral SCDs in place. A time out was called and all facts were verified. A pneumoperitoneum was obtained via A Veress needle technique to a pressure of 14mm of mercury. A 5mm trochar was then placed in the right upper quadrant under visualization, and there were no injuries to any abdominal organs. A 11 mm port was then placed in the umbilical region after infiltrating with local anesthesia under direct visualization. A second and third epigastric port and right lower quadrant port placement under direct visualization, respectively. The gallbladder was identified and retracted, the peritoneum was then sharply dissected from the gallbladder and this dissection was carried down to Calot's triangle. The gallbladder was identified and stripped away circumferentially and seen going into the gallbladder 360, the critical angle was obtained. 2 clips were placed proximally one distally and the cystic duct transected. The cystic artery was identified and 2 clips placed proximally and one distally and transected.  We then proceeded to remove the gallbladder off the hepatic fossa with Bovie cautery. A latex bag was then placed in the abdomen and gallbladder placed in the bag. The hepatic fossa was then reexamined and hemostasis was achieved with Bovie cautery and was excellent at the end of  the case. The subhepatic fossa and perihepatic fossa was then irrigated until the effluent was clear. The 11 mm trocar fascia was reapproximated with the Endo Close #1 Vicryl.  The pneumoperitoneum was evacuated and all trochars removed under direct visulalization.  The skin was then closed with 4-0 Monocryl and the skin dressed with Steri-Strips, gauze, and tape.  The patient was awaken from general anesthesia and taken to the recovery room in stable condition.

## 2013-04-28 NOTE — Transfer of Care (Signed)
Immediate Anesthesia Transfer of Care Note  Patient: Troy Rodgers.  Procedure(s) Performed: Procedure(s): LAPAROSCOPIC CHOLECYSTECTOMY  Patient Location: PACU  Anesthesia Type:General  Level of Consciousness: awake, alert  and oriented  Airway & Oxygen Therapy: Patient Spontanous Breathing and Patient connected to face mask oxygen  Post-op Assessment: Report given to PACU RN, Post -op Vital signs reviewed and stable and Patient moving all extremities  Post vital signs: Reviewed and stable  Complications: No apparent anesthesia complications

## 2013-04-28 NOTE — Anesthesia Procedure Notes (Signed)
Procedure Name: Intubation Date/Time: 04/28/2013 2:10 PM Performed by: Orvilla Fus A Pre-anesthesia Checklist: Patient identified, Timeout performed, Emergency Drugs available, Suction available and Patient being monitored Patient Re-evaluated:Patient Re-evaluated prior to inductionOxygen Delivery Method: Circle system utilized Preoxygenation: Pre-oxygenation with 100% oxygen Intubation Type: IV induction Ventilation: Mask ventilation without difficulty and Oral airway inserted - appropriate to patient size Laryngoscope Size: Miller and 3 Grade View: Grade II Tube type: Oral Tube size: 7.0 mm Number of attempts: 3 (DLx1 paradmedic student, DLx1 CRNA grade III view- unable to pass ETT. DL x1 MDA  difficult to pass tube- bougie used.) Airway Equipment and Method: Bougie stylet Placement Confirmation: ETT inserted through vocal cords under direct vision,  positive ETCO2 and breath sounds checked- equal and bilateral Secured at: 23 cm Tube secured with: Tape Dental Injury: Teeth and Oropharynx as per pre-operative assessment  Difficulty Due To: Difficult Airway- due to anterior larynx, Difficult Airway- due to immobile epiglottis and Difficulty was unanticipated

## 2013-04-28 NOTE — Anesthesia Preprocedure Evaluation (Signed)
Anesthesia Evaluation  Patient identified by MRN, date of birth, ID band Patient awake    Reviewed: Allergy & Precautions, H&P , NPO status , Patient's Chart, lab work & pertinent test results  Airway Mallampati: II TM Distance: >3 FB     Dental  (+) Teeth Intact and Dental Advisory Given   Pulmonary  breath sounds clear to auscultation        Cardiovascular Rhythm:Regular Rate:Normal     Neuro/Psych    GI/Hepatic   Endo/Other    Renal/GU      Musculoskeletal   Abdominal   Peds  Hematology   Anesthesia Other Findings   Reproductive/Obstetrics                           Anesthesia Physical Anesthesia Plan  ASA: III  Anesthesia Plan: General   Post-op Pain Management:    Induction: Intravenous  Airway Management Planned: Oral ETT  Additional Equipment:   Intra-op Plan:   Post-operative Plan: Extubation in OR  Informed Consent: I have reviewed the patients History and Physical, chart, labs and discussed the procedure including the risks, benefits and alternatives for the proposed anesthesia with the patient or authorized representative who has indicated his/her understanding and acceptance.   Dental advisory given  Plan Discussed with: CRNA and Anesthesiologist  Anesthesia Plan Comments: (Symptomatic cholelithiasis H/O bradycardia GERD  Plan GA with oral ETT  Kipp Brood, MD)        Anesthesia Quick Evaluation

## 2013-04-28 NOTE — Anesthesia Postprocedure Evaluation (Signed)
  Anesthesia Post-op Note  Patient: Troy Rodgers.  Procedure(s) Performed: Procedure(s): LAPAROSCOPIC CHOLECYSTECTOMY  Patient Location: PACU  Anesthesia Type:General  Level of Consciousness: awake, alert  and oriented  Airway and Oxygen Therapy: Patient Spontanous Breathing and Patient connected to nasal cannula oxygen  Post-op Pain: mild  Post-op Assessment: Post-op Vital signs reviewed, Patient's Cardiovascular Status Stable, Respiratory Function Stable, Patent Airway and Pain level controlled  Post-op Vital Signs: stable  Complications: No apparent anesthesia complications

## 2013-04-28 NOTE — Progress Notes (Signed)
Subjective: Patient seen at the bedside. He feels better this morning. His abdominal pain is still present, but it is better controlled. He feels less weak and denies fevers or chills presently. He has had no further episodes of syncope. We explained that we are seeing him in consultation and will follow his medical problems and treat as needed. We discussed drawing blood cultures and starting an additional antibiotic.  Objective: Vital signs in last 24 hours: Filed Vitals:   04/27/13 2143 04/28/13 0148 04/28/13 0453 04/28/13 0615  BP: 107/34 103/40 122/47   Pulse: 73 72 87   Temp: 100 F (37.8 C) 102 F (38.9 C) 102.4 F (39.1 C) 101.3 F (38.5 C)  TempSrc: Oral Oral Oral   Resp: 20 18 20    Height: 5\' 6"  (1.676 m)     Weight: 160 lb 0.9 oz (72.6 kg)     SpO2: 100% 100% 93%    Weight change:   Intake/Output Summary (Last 24 hours) at 04/28/13 1146 Last data filed at 04/28/13 0900  Gross per 24 hour  Intake   1000 ml  Output    276 ml  Net    724 ml   Physical Exam General: No acute distress, pleasant, alert HEENT: NCAT, vision grossly intact, oropharynx clear and non-erythematous  Neck: supple, no lymphadenopathy Lungs: clear to ascultation bilaterally, normal work of respiration, no wheezes, rales, ronchi Heart: regular rate and rhythm, no murmurs, gallops, or rubs Abdomen: TTP in RUQ, soft, mild distention, normal bowel sounds  Extremities: 2+ DP/PT pulses bilaterally, no cyanosis, clubbing, or edema Neurologic: unable to complete 2/2 pt's mental status  Lab Results: Basic Metabolic Panel:  Recent Labs Lab 04/27/13 1506 04/28/13 0505  NA 131* 132*  K 4.3 4.1  CL 93* 94*  CO2 29 27  GLUCOSE 147* 118*  BUN 10 10  CREATININE 0.98 1.06  CALCIUM 9.1 8.6   Liver Function Tests:  Recent Labs Lab 04/27/13 1506 04/28/13 0505  AST 63* 54*  ALT 50 36  ALKPHOS 91 79  BILITOT 0.4 0.6  PROT 7.5 7.0  ALBUMIN 3.2* 2.9*   CBC:  Recent Labs Lab  04/27/13 0007 04/27/13 1506 04/28/13 0505  WBC 21.8* 20.7* 20.6*  NEUTROABS  --  15.5*  --   HGB 12.7* 13.0 12.3*  HCT 35.9* 36.0* 35.1*  MCV 101.1* 100.0 101.2*  PLT 254 262 268   Urinalysis:  Recent Labs Lab 04/27/13 1913  COLORURINE YELLOW  LABSPEC 1.031*  PHURINE 5.0  GLUCOSEU NEGATIVE  HGBUR NEGATIVE  BILIRUBINUR NEGATIVE  KETONESUR 15*  PROTEINUR 100*  UROBILINOGEN 0.2  NITRITE NEGATIVE  LEUKOCYTESUR NEGATIVE   Micro Results: Recent Results (from the past 240 hour(s))  SURGICAL PCR SCREEN     Status: Abnormal   Collection Time    04/28/13  3:10 AM      Result Value Range Status   MRSA, PCR NEGATIVE  NEGATIVE Final   Staphylococcus aureus POSITIVE (*) NEGATIVE Final   Comment:            The Xpert SA Assay (FDA     approved for NASAL specimens     in patients over 39 years of age),     is one component of     a comprehensive surveillance     program.  Test performance has     been validated by [employer] for patients greater     than or equal to 59 year old.  It is not intended     to diagnose infection nor to     guide or monitor treatment.   Studies/Results: US Abdomen Complete  04/27/2013   CLINICAL DATA:  Abdominal pain  EXAM: ULTRASOUND ABDOMEN COMPLETE  COMPARISON:  None.  FINDINGS: Gallbladder  There is sludge and there are tiny gallstones. Gallbladder wall thickness is within normal limits at 2.7 mm. There is no Murphy sign.  Common bile duct  Diameter: 4.8 mm  Liver  No focal lesion identified. Within normal limits in parenchymal echogenicity.  IVC  No abnormality visualized.  Pancreas  Visualized portion unremarkable.  Spleen  Size and appearance within normal limits.  Right Kidney  Length: 11.3 cm Echogenicity within normal limits. No mass or hydronephrosis visualized.  Left Kidney  Length: 10.3 cm Echogenicity within normal limits. No mass or hydronephrosis visualized.  Abdominal aorta  Visualized portions normal, although several portions  are obscured by bowel gas.  IMPRESSION: Sludge and tiny gallbladder calculi.  No other abnormalities.   Electronically Signed   By: Esperanza Heir M.D.   On: 04/27/2013 16:38   Dg Abd Acute W/chest  04/27/2013   CLINICAL DATA:  Patient had a contrasted study at another facility yesterday, patient continues to have abdominal pain in the upper abdomen  EXAM: ACUTE ABDOMEN SERIES (ABDOMEN 2 VIEW & CHEST 1 VIEW)  COMPARISON:  04/27/2013 CT scan  FINDINGS: Heart size mildly enlarged. Nodular opacity measuring about 7 mm over the left upper lobe. Mild diffuse interstitial change likely chronic. No free air.  No abnormally dilated loops of bowel. No air-fluid levels. Oral contrast is seen throughout the entire colon and IV contrast remains in the bladder from examination the patient reports having yesterday.  IMPRESSION: 1. Nonobstructive bowel gas pattern 2. Mild diffuse interstitial lung disease likely chronic. Report of a possible nodule right lung base on CT abdomen pelvis performed 04/27/2013 noted, for which CT thorax is recommended. There is also evidence of a possible nodule over the left upper lobe for which CT thorax is suggested.   Electronically Signed   By: Esperanza Heir M.D.   On: 04/27/2013 19:01   Medications: I have reviewed the patient's current medications. Scheduled Meds: . atorvastatin  40 mg Oral Daily  . ciprofloxacin  400 mg Intravenous Q12H  . enoxaparin (LOVENOX) injection  40 mg Subcutaneous Q24H  . escitalopram  20 mg Oral Daily  . ezetimibe  10 mg Oral Daily  . lacosamide  200 mg Oral BID  . levothyroxine  50 mcg Oral QAC breakfast  . pantoprazole  40 mg Oral Daily  . primidone  250 mg Oral TID   Continuous Infusions: . sodium chloride 125 mL/hr at 04/28/13 0525   PRN Meds:.acetaminophen, HYDROmorphone (DILAUDID) injection, ondansetron Assessment/Plan: Patient is a 67 y.o. male with history of epilepsy (controlled, on lacsamide), rheumatoid arthritis (well controlled  on low dose methotrexate), mild hypothyroidism, HL who was admitted to [facility] on 04/27/2013 for evaluation of abdominal pain. Dr. Luisa Hart of general surgery evaluated patient, diagnosed with acute cholecystitis; pt scheduled for laparoscopic cholecystecyomy with Dr. Derrell Lolling later today.  # Estimation of risk of surgery - Using Goldman criteria patient gets 1 point for emergent procedure that will be intraperitoneal however no other risks and estimated cardiac risk of surgery 1%. ECHO from 2013 without acute findings and normal cardiac function. EKG reviewed from today and without significant change from EKG 1 year ago.  - Ok to proceed with surgery from a medical perspective  #  Episode of confusion with fall/syncope - Patient is alert and oriented on our exam this morning. Family related that episode yesterday was not typical for his seizure pattern. Most likely etiology is pain medication and supported by the fact that exposure to fentanyl in ED caused severe reaction with non-responsive episode (with hypoxia and hypotension) that resolved within minutes. EKG without arrhythmias or acute changes from previous study; no signs of ischemia.  - Continue pain control with Dilaudid 0.5mg  q2h prn as he seems to be tolerating this well - Continue to monitor for adverse medication reaction given sensitivity to narcotics  # Abdominal pain - Patient does have right upper quadrant tenderness, plan is for cholecystectomy later today. WBC 20.6.  - Patient currently on ciprofloxacin 400mg  q12h for presumed cholecystitis.  - Adding Flagyl 500mg  IV q8h to antibiotic regimen for empiric anaerobic coverage - Ordered blood cultures  # Hypothyroidism - Patient does appear to be on synthroid at home and verbalizes that he has thryroid dysfunction. Now is not an appropriate time to check TSH given acute stressor.  - Recommend continuing synthroid 50 mcg daily (if NPO, conversion to IV dosing is 1/2 normal dose and would be  synthroid IV 25 mcg daily)  # Hyperlipidemia - Last LDL was 75. - Recommend continuing therapy with statin - Would hold Zetia while NPO  # Seizure disorder - Patient is on vimpat 200 mg BID at home and does not sound like he has had new seizure at home recently. He has not had grand mal seizure in several years but does have small (<5-10 sec) seizures at home a couple of times per month.  - Continue home Vimpat and Mysoline  # Recommendations - as above, if NPO for prolonged period will need conversion of medications to IV. Will follow along for any developments.   #DVT PPX- lovenox (starting post-op)   Dispo: Disposition is deferred at this time, awaiting improvement of current medical problems.  Anticipated discharge in approximately 1-3 day(s).   The patient does have a current PCP Ernestina Penna, MD) and does need an [facility] hospital follow-up appointment after discharge.  The patient does not have transportation limitations that hinder transportation to clinic appointments.  .Services Needed at time of discharge: Y = Yes, Blank = No PT:   OT:   RN:   Equipment:   Other:     LOS: 1 day   Vivi Barrack, MD 04/28/2013, 11:46 AM

## 2013-04-29 ENCOUNTER — Observation Stay (HOSPITAL_COMMUNITY): Payer: Medicare Other

## 2013-04-29 DIAGNOSIS — K8020 Calculus of gallbladder without cholecystitis without obstruction: Secondary | ICD-10-CM

## 2013-04-29 DIAGNOSIS — R0602 Shortness of breath: Secondary | ICD-10-CM

## 2013-04-29 LAB — URINE CULTURE
Colony Count: NO GROWTH
Culture: NO GROWTH

## 2013-04-29 LAB — CBC
HCT: 34.5 % — ABNORMAL LOW (ref 39.0–52.0)
MCH: 34.8 pg — ABNORMAL HIGH (ref 26.0–34.0)
MCV: 101.8 fL — ABNORMAL HIGH (ref 78.0–100.0)
Platelets: 285 10*3/uL (ref 150–400)
RDW: 13.3 % (ref 11.5–15.5)
WBC: 14.7 10*3/uL — ABNORMAL HIGH (ref 4.0–10.5)

## 2013-04-29 LAB — BASIC METABOLIC PANEL
BUN: 12 mg/dL (ref 6–23)
CO2: 27 mEq/L (ref 19–32)
Calcium: 8.9 mg/dL (ref 8.4–10.5)
Chloride: 99 mEq/L (ref 96–112)
Creatinine, Ser: 0.97 mg/dL (ref 0.50–1.35)
Glucose, Bld: 132 mg/dL — ABNORMAL HIGH (ref 70–99)

## 2013-04-29 MED ORDER — SALINE SPRAY 0.65 % NA SOLN
1.0000 | NASAL | Status: DC | PRN
  Filled 2013-04-29: qty 44

## 2013-04-29 MED ORDER — IPRATROPIUM BROMIDE 0.02 % IN SOLN
0.5000 mg | RESPIRATORY_TRACT | Status: DC
  Administered 2013-04-30: 0.5 mg via RESPIRATORY_TRACT
  Filled 2013-04-29: qty 2.5

## 2013-04-29 MED ORDER — OXYCODONE-ACETAMINOPHEN 5-325 MG PO TABS: 1.0000 | ORAL_TABLET | Freq: Four times a day (QID) | ORAL | Status: DC | PRN

## 2013-04-29 MED ORDER — ALBUTEROL SULFATE (5 MG/ML) 0.5% IN NEBU
2.5000 mg | INHALATION_SOLUTION | RESPIRATORY_TRACT | Status: DC
  Administered 2013-04-30: 2.5 mg via RESPIRATORY_TRACT
  Filled 2013-04-29: qty 0.5

## 2013-04-29 NOTE — Progress Notes (Deleted)
Discharge home. Home discharge instruction given, no question verbalized. 

## 2013-04-29 NOTE — Progress Notes (Signed)
I have seen and examined the pt and agree with PA-Dort's progress note. Con't pulm toilet and possible DC in AM Con't Reg diet

## 2013-04-29 NOTE — Progress Notes (Addendum)
Subjective:  Troy Rodgers was seen and evaluated by me this morning.  He underwent laparoscopic cholecystectomy yesterday and his abdominal pain is improving.  His appetite is okay and he has partially eaten breakfast tray at bedside.  He denies fever, N/V, CP/SOB.  No flatus or BM since surgery.    Objective: Vital signs in last 24 hours: Filed Vitals:   04/28/13 2142 04/29/13 0228 04/29/13 0655 04/29/13 0900  BP: 122/62 139/66 121/55 110/60  Pulse: 98 92 81 71  Temp: 99.5 F (37.5 C) 99.9 F (37.7 C) 98.4 F (36.9 C) 98.7 F (37.1 C)  TempSrc: Oral Oral Oral Oral  Resp: 20 20 20 24   Height:      Weight:      SpO2: 97% 93% 97% 93%   Weight change:   Intake/Output Summary (Last 24 hours) at 04/29/13 1104 Last data filed at 04/29/13 0600  Gross per 24 hour  Intake   3700 ml  Output   1050 ml  Net   2650 ml   Physical Exam General: resting in bed in NAD Cardiac: RRR, no rubs, murmurs or gallops Pulm: decreased BS at R lower lung base, otherwise clear and moving normal volumes of air Abd: soft, mildly distended, hypoactive BS, appropriately TTP at surgical sites Ext: warm and well perfused, no pedal edema Neuro: alert and oriented X3, following commands and moving extremities independently  Lab Results: Basic Metabolic Panel:  Recent Labs Lab 04/27/13 1506 04/28/13 0505  NA 131* 132*  K 4.3 4.1  CL 93* 94*  CO2 29 27  GLUCOSE 147* 118*  BUN 10 10  CREATININE 0.98 1.06  CALCIUM 9.1 8.6   Liver Function Tests:  Recent Labs Lab 04/27/13 1506 04/28/13 0505  AST 63* 54*  ALT 50 36  ALKPHOS 91 79  BILITOT 0.4 0.6  PROT 7.5 7.0  ALBUMIN 3.2* 2.9*   CBC:  Recent Labs Lab 04/27/13 0007 04/27/13 1506 04/28/13 0505  WBC 21.8* 20.7* 20.6*  NEUTROABS  --  15.5*  --   HGB 12.7* 13.0 12.3*  HCT 35.9* 36.0* 35.1*  MCV 101.1* 100.0 101.2*  PLT 254 262 268   Urinalysis:  Recent Labs Lab 04/27/13 1913  COLORURINE YELLOW  LABSPEC 1.031*  PHURINE 5.0    GLUCOSEU NEGATIVE  HGBUR NEGATIVE  BILIRUBINUR NEGATIVE  KETONESUR 15*  PROTEINUR 100*  UROBILINOGEN 0.2  NITRITE NEGATIVE  LEUKOCYTESUR NEGATIVE   Micro Results: Recent Results (from the past 240 hour(s))  URINE CULTURE     Status: None   Collection Time    04/27/13  7:13 PM      Result Value Range Status   Specimen Description URINE, CLEAN CATCH   Final   Special Requests NONE   Final   Culture  Setup Time     Final   Value: 04/28/2013 01:57     Performed at [facility] Count     Final   Value: NO GROWTH     Performed at [facility]   Culture     Final   Value: NO GROWTH     Performed at [facility]   Report Status 04/29/2013 FINAL   Final  SURGICAL PCR SCREEN     Status: Abnormal   Collection Time    04/28/13  3:10 AM      Result Value Range Status   MRSA, PCR NEGATIVE  NEGATIVE Final   Staphylococcus aureus POSITIVE (*) NEGATIVE Final   Comment:  The Xpert SA Assay (FDA     approved for NASAL specimens     in patients over 36 years of age),     is one component of     a comprehensive surveillance     program.  Test performance has     been validated by [employer] for patients greater     than or equal to 93 year old.     It is not intended     to diagnose infection nor to     guide or monitor treatment.  CULTURE, BLOOD (ROUTINE X 2)     Status: None   Collection Time    04/28/13 11:20 AM      Result Value Range Status   Specimen Description BLOOD LEFT ANTECUBITAL   Final   Special Requests BOTTLES DRAWN AEROBIC AND ANAEROBIC 10CC   Final   Culture  Setup Time     Final   Value: 04/28/2013 14:06     Performed at [facility]   Culture     Final   Value:        BLOOD CULTURE RECEIVED NO GROWTH TO DATE CULTURE WILL BE HELD FOR 5 DAYS BEFORE ISSUING A FINAL NEGATIVE REPORT     Performed at [facility]   Report Status PENDING   Incomplete  CULTURE, BLOOD (ROUTINE X 2)     Status: None    Collection Time    04/28/13 11:20 AM      Result Value Range Status   Specimen Description BLOOD FOREARM LEFT   Final   Special Requests BOTTLES DRAWN AEROBIC ONLY   Final   Culture  Setup Time     Final   Value: 04/28/2013 14:06     Performed at [facility]   Culture     Final   Value:        BLOOD CULTURE RECEIVED NO GROWTH TO DATE CULTURE WILL BE HELD FOR 5 DAYS BEFORE ISSUING A FINAL NEGATIVE REPORT     Performed at [facility]   Report Status PENDING   Incomplete   Studies/Results: US Abdomen Complete  04/27/2013   CLINICAL DATA:  Abdominal pain  EXAM: ULTRASOUND ABDOMEN COMPLETE  COMPARISON:  None.  FINDINGS: Gallbladder  There is sludge and there are tiny gallstones. Gallbladder wall thickness is within normal limits at 2.7 mm. There is no Murphy sign.  Common bile duct  Diameter: 4.8 mm  Liver  No focal lesion identified. Within normal limits in parenchymal echogenicity.  IVC  No abnormality visualized.  Pancreas  Visualized portion unremarkable.  Spleen  Size and appearance within normal limits.  Right Kidney  Length: 11.3 cm Echogenicity within normal limits. No mass or hydronephrosis visualized.  Left Kidney  Length: 10.3 cm Echogenicity within normal limits. No mass or hydronephrosis visualized.  Abdominal aorta  Visualized portions normal, although several portions are obscured by bowel gas.  IMPRESSION: Sludge and tiny gallbladder calculi.  No other abnormalities.   Electronically Signed   By: Esperanza Heir M.D.   On: 04/27/2013 16:38   Dg Abd Acute W/chest  04/27/2013   CLINICAL DATA:  Patient had a contrasted study at another facility yesterday, patient continues to have abdominal pain in the upper abdomen  EXAM: ACUTE ABDOMEN SERIES (ABDOMEN 2 VIEW & CHEST 1 VIEW)  COMPARISON:  04/27/2013 CT scan  FINDINGS: Heart size mildly enlarged. Nodular opacity measuring about 7 mm over the left  upper lobe. Mild diffuse interstitial change likely chronic. No free  air.  No abnormally dilated loops of bowel. No air-fluid levels. Oral contrast is seen throughout the entire colon and IV contrast remains in the bladder from examination the patient reports having yesterday.  IMPRESSION: 1. Nonobstructive bowel gas pattern 2. Mild diffuse interstitial lung disease likely chronic. Report of a possible nodule right lung base on CT abdomen pelvis performed 04/27/2013 noted, for which CT thorax is recommended. There is also evidence of a possible nodule over the left upper lobe for which CT thorax is suggested.   Electronically Signed   By: Esperanza Heir M.D.   On: 04/27/2013 19:01   Medications: I have reviewed the patient's current medications. Scheduled Meds: . atorvastatin  40 mg Oral Daily  . enoxaparin (LOVENOX) injection  40 mg Subcutaneous Q24H  . escitalopram  20 mg Oral Daily  . ezetimibe  10 mg Oral Daily  . lacosamide  200 mg Oral BID  . levothyroxine  50 mcg Oral QAC breakfast  . metronidazole  500 mg Intravenous Q8H  . pantoprazole  40 mg Oral Daily  . primidone  250 mg Oral TID   Continuous Infusions: . sodium chloride 125 mL/hr at 04/29/13 0352  . lactated ringers 50 mL/hr (04/28/13 1255)   PRN Meds:.acetaminophen, ondansetron, oxyCODONE-acetaminophen Assessment/Plan: Patient is a 67 y.o. male with history of epilepsy (controlled, on lacsamide), rheumatoid arthritis (well controlled on low dose methotrexate), mild hypothyroidism, HL who was admitted to [facility] on 04/27/2013 for evaluation of abdominal pain. Dr. Luisa Hart of general surgery evaluated patient, diagnosed with acute cholecystitis; patient underwent laparoscopic cholecystecyomy on 10/15.  # Episode of confusion with fall/syncope - Patient is alert and oriented on our exam this morning. Family related that episode was not typical for his seizure pattern. Most likely etiology is pain medication and supported by the fact that exposure to fentanyl in ED caused severe reaction with  non-responsive episode (with hypoxia and hypotension) that resolved within minutes. EKG without arrhythmias or acute changes from previous study; no signs of ischemia.  - resolved; d/c with appropriate post-op pain control   # Abdominal pain - Patient's right upper quadrant pain has improved after cholecystectomy yesterday. - Patient currently on ciprofloxacin 400mg  q12h and Flagyl 500mg  IV q8h  - Blood cultures pending  # Hypothyroidism - Patient does appear to be on synthroid at home and verbalizes that he has thryroid dysfunction. Now is not an appropriate time to check TSH given acute stressor.  - Recommend continuing synthroid 50 mcg daily (if NPO, conversion to IV dosing is 1/2 normal dose and would be synthroid IV 25 mcg daily)  # Hyperlipidemia - Last LDL was 75. - Recommend continuing therapy with statin - Resume Zetia at d/c  # Seizure disorder - Patient is on vimpat 200 mg BID at home and does not sound like he has had new seizure at home recently. He has not had grand mal seizure in several years but does have small (<5-10 sec) seizures at home a couple of times per month.  - Continue home Vimpat and Mysoline  #DVT PPX- lovenox (starting post-op)  Dispo:  Patient has been evaluated post-op by [facility] and plan is for discharge home today with follow-up with [facility]  in 3 weeks (appointment is 05/25/13).  PCP hospital follow-up appointment is scheduled for 05/06/13.  The patient does have a current PCP Troy Penna, MD) and does need an [facility] hospital follow-up appointment after discharge.  The patient does not have transportation limitations that hinder transportation to clinic appointments.  .Services Needed at time of discharge: Y = Yes, Blank = No PT:   OT:   RN:   Equipment:   Other:     LOS: 2 days   Evelena Peat, DO 04/29/2013, 11:04 AM

## 2013-04-29 NOTE — Discharge Summary (Signed)
Physician Discharge Summary  Patient ID: Troy Rodgers. MRN: [id number] DOB/AGE: [birth year] 67 y.o.  Admit date: 04/27/2013 Discharge date: 04/29/2013  Admitting Diagnosis: Symptomatic cholelithiasis Biliary colic  Discharge Diagnosis Patient Active Problem List   Diagnosis Date Noted  . Acute gangrenous cholecystitis 04/28/2013  . Abnormal EKG 04/22/2012  . Other and unspecified hyperlipidemia   . Depressive disorder, not elsewhere classified   . Localization-related (focal) (partial) epilepsy and epileptic syndromes with complex partial seizures, without mention of intractable epilepsy   . Kidney stone   . Impotence of organic origin   . External hemorrhoid   . Nasal septal perforation   . Cataract   . Hyperplasia of prostate     Consultants None  Imaging: US Abdomen Complete  04/27/2013   CLINICAL DATA:  Abdominal pain  EXAM: ULTRASOUND ABDOMEN COMPLETE  COMPARISON:  None.  FINDINGS: Gallbladder  There is sludge and there are tiny gallstones. Gallbladder wall thickness is within normal limits at 2.7 mm. There is no Murphy sign.  Common bile duct  Diameter: 4.8 mm  Liver  No focal lesion identified. Within normal limits in parenchymal echogenicity.  IVC  No abnormality visualized.  Pancreas  Visualized portion unremarkable.  Spleen  Size and appearance within normal limits.  Right Kidney  Length: 11.3 cm Echogenicity within normal limits. No mass or hydronephrosis visualized.  Left Kidney  Length: 10.3 cm Echogenicity within normal limits. No mass or hydronephrosis visualized.  Abdominal aorta  Visualized portions normal, although several portions are obscured by bowel gas.  IMPRESSION: Sludge and tiny gallbladder calculi.  No other abnormalities.   Electronically Signed   By: Esperanza Heir M.D.   On: 04/27/2013 16:38   Dg Abd Acute W/chest  04/27/2013   CLINICAL DATA:  Patient had a contrasted study at another facility yesterday, patient continues to have abdominal pain  in the upper abdomen  EXAM: ACUTE ABDOMEN SERIES (ABDOMEN 2 VIEW & CHEST 1 VIEW)  COMPARISON:  04/27/2013 CT scan  FINDINGS: Heart size mildly enlarged. Nodular opacity measuring about 7 mm over the left upper lobe. Mild diffuse interstitial change likely chronic. No free air.  No abnormally dilated loops of bowel. No air-fluid levels. Oral contrast is seen throughout the entire colon and IV contrast remains in the bladder from examination the patient reports having yesterday.  IMPRESSION: 1. Nonobstructive bowel gas pattern 2. Mild diffuse interstitial lung disease likely chronic. Report of a possible nodule right lung base on CT abdomen pelvis performed 04/27/2013 noted, for which CT thorax is recommended. There is also evidence of a possible nodule over the left upper lobe for which CT thorax is suggested.   Electronically Signed   By: Esperanza Heir M.D.   On: 04/27/2013 19:01    Procedures Dr. Derrell Lolling (04/28/13) - Laparoscopic Cholecystectomy   Hospital Course:  67 y/o male presents to [facility] on 04/28/13 with 3 day history of abdominal pain. Started Sat 04/24/13 after dinner.  Went to ED in [place] and sent home. Went back 04/26/13 with continued pain and sent home again. Has syncopal episode at home? Today but not a seizure according to wife. Had pain with U/S. Low grade fever. Gallstones on U/S.   Patient was admitted and underwent procedure listed above.  Tolerated procedure well and was transferred to the floor.  Diet was advanced as tolerated.  On POD #1, the patient was voiding well, tolerating diet, ambulating well, pain well controlled, vital signs stable, incisions c/d/i and felt stable for discharge  home.  Patient will follow up in our office in 3 weeks and knows to call with questions or concerns.   Physical Exam: General:  Alert, NAD, pleasant, comfortable Abd:  Soft, ND, mild tenderness, incisions C/D/I    Medication List    STOP taking these medications        HYDROcodone-acetaminophen 5-325 MG per tablet  Commonly known as:  NORCO/VICODIN      TAKE these medications       aspirin 81 MG EC tablet  Take 81 mg by mouth daily.     atorvastatin 40 MG tablet  Commonly known as:  LIPITOR  Take 40 mg by mouth daily.     escitalopram 20 MG tablet  Commonly known as:  LEXAPRO  Take 20 mg by mouth daily.     fish oil-omega-3 fatty acids 1000 MG capsule  Take 1 g by mouth 2 (two) times daily.     folic acid 1 MG tablet  Commonly known as:  FOLVITE  Take 1 mg by mouth 2 (two) times daily.     lacosamide 200 MG Tabs tablet  Commonly known as:  VIMPAT  Take 200 mg by mouth 2 (two) times daily.     levothyroxine 50 MCG tablet  Commonly known as:  SYNTHROID, LEVOTHROID  Take 50 mcg by mouth daily.     methotrexate 5 MG tablet  Commonly known as:  RHEUMATREX  Take 10 mg by mouth once a week. Caution: Chemotherapy. Protect from light.     omeprazole 20 MG capsule  Commonly known as:  PRILOSEC  Take 40 mg by mouth daily.     oxyCODONE-acetaminophen 5-325 MG per tablet  Commonly known as:  PERCOCET/ROXICET  Take 1-2 tablets by mouth every 6 (six) hours as needed.     primidone 250 MG tablet  Commonly known as:  MYSOLINE  Take 250 mg by mouth 3 (three) times daily.     Vitamin D3 2000 UNITS Tabs  Take 1 tablet by mouth daily.     ZETIA 10 MG tablet  Generic drug:  ezetimibe  TAKE ONE TABLET BY MOUTH DAILY FOR HYPERLIPIDERMEA             Follow-up Information   Follow up with [facility] On 05/25/2013. (Your appointment is at 1:45pm, please arrive at least 30 minutes before your appointment to complete your check in paperwork.  If you are unable to arrive 30 minutes prior to your appointment time we may have to cancel or reschedule you.)    Contact information:   [address]   [place] [address] [phone]       Signed: Candiss Norse [facility] [phone]  04/29/2013,  8:21 AM

## 2013-04-29 NOTE — Progress Notes (Signed)
1 Day Post-Op  Subjective: Feels pretty good, required 3L [place] O2 this am, but has weaned off to room air with O2 sats between 90-94 with sitting and 83-89 with ambulation.  Feels fatigued with ambulation and difficulty taking deep breaths.  Appetite good, tolerated regular diet.  Urinating well.  Using IS, this morning was at 500, now at 1000.  Objective: Vital signs in last 24 hours: Temp:  [97.1 F (36.2 C)-99.9 F (37.7 C)] 98.7 F (37.1 C) (10/16 0900) Pulse Rate:  [71-101] 71 (10/16 0900) Resp:  [16-29] 16 (10/16 1300) BP: (110-164)/(54-73) 110/60 mmHg (10/16 0900) SpO2:  [92 %-100 %] 92 % (10/16 1300) Last BM Date: 04/28/13  Intake/Output from previous day: 10/15 0701 - 10/16 0700 In: 3700 [I.V.:3700] Out: 1326 [Urine:1325; Stool:1] Intake/Output this shift:    PE: Gen:  Alert, NAD, pleasant Card:  RRR, no M/G/R heard, tachy to the 100-105 with ambulation Pulm:  CTA, no W/R/R, low effort, IS at 1000 Abd: Soft, NT/ND, +BS, no HSM, incisions C/D/I, drain with minimal sanguinous drainage, no abdominal scars noted   Lab Results:   Recent Labs  04/27/13 1506 04/28/13 0505  WBC 20.7* 20.6*  HGB 13.0 12.3*  HCT 36.0* 35.1*  PLT 262 268   BMET  Recent Labs  04/27/13 1506 04/28/13 0505  NA 131* 132*  K 4.3 4.1  CL 93* 94*  CO2 29 27  GLUCOSE 147* 118*  BUN 10 10  CREATININE 0.98 1.06  CALCIUM 9.1 8.6   PT/INR No results found for this basename: LABPROT, INR,  in the last 72 hours CMP     Component Value Date/Time   NA 132* 04/28/2013 0505   K 4.1 04/28/2013 0505   CL 94* 04/28/2013 0505   CO2 27 04/28/2013 0505   GLUCOSE 118* 04/28/2013 0505   BUN 10 04/28/2013 0505   CREATININE 1.06 04/28/2013 0505   CREATININE 1.02 01/14/2013 1050   CALCIUM 8.6 04/28/2013 0505   PROT 7.0 04/28/2013 0505   ALBUMIN 2.9* 04/28/2013 0505   AST 54* 04/28/2013 0505   ALT 36 04/28/2013 0505   ALKPHOS 79 04/28/2013 0505   BILITOT 0.6 04/28/2013 0505   GFRNONAA 71*  04/28/2013 0505   GFRAA 82* 04/28/2013 0505   Lipase  No results found for this basename: lipase       Studies/Results: US Abdomen Complete  04/27/2013   CLINICAL DATA:  Abdominal pain  EXAM: ULTRASOUND ABDOMEN COMPLETE  COMPARISON:  None.  FINDINGS: Gallbladder  There is sludge and there are tiny gallstones. Gallbladder wall thickness is within normal limits at 2.7 mm. There is no Murphy sign.  Common bile duct  Diameter: 4.8 mm  Liver  No focal lesion identified. Within normal limits in parenchymal echogenicity.  IVC  No abnormality visualized.  Pancreas  Visualized portion unremarkable.  Spleen  Size and appearance within normal limits.  Right Kidney  Length: 11.3 cm Echogenicity within normal limits. No mass or hydronephrosis visualized.  Left Kidney  Length: 10.3 cm Echogenicity within normal limits. No mass or hydronephrosis visualized.  Abdominal aorta  Visualized portions normal, although several portions are obscured by bowel gas.  IMPRESSION: Sludge and tiny gallbladder calculi.  No other abnormalities.   Electronically Signed   By: Esperanza Heir M.D.   On: 04/27/2013 16:38   Dg Abd Acute W/chest  04/27/2013   CLINICAL DATA:  Patient had a contrasted study at another facility yesterday, patient continues to have abdominal pain in the upper abdomen  EXAM: ACUTE ABDOMEN SERIES (ABDOMEN 2 VIEW & CHEST 1 VIEW)  COMPARISON:  04/27/2013 CT scan  FINDINGS: Heart size mildly enlarged. Nodular opacity measuring about 7 mm over the left upper lobe. Mild diffuse interstitial change likely chronic. No free air.  No abnormally dilated loops of bowel. No air-fluid levels. Oral contrast is seen throughout the entire colon and IV contrast remains in the bladder from examination the patient reports having yesterday.  IMPRESSION: 1. Nonobstructive bowel gas pattern 2. Mild diffuse interstitial lung disease likely chronic. Report of a possible nodule right lung base on CT abdomen pelvis performed  04/27/2013 noted, for which CT thorax is recommended. There is also evidence of a possible nodule over the left upper lobe for which CT thorax is suggested.   Electronically Signed   By: Esperanza Heir M.D.   On: 04/27/2013 19:01    Anti-infectives: Anti-infectives   Start     Dose/Rate Route Frequency Ordered Stop   04/28/13 2100  ciprofloxacin (CIPRO) IVPB 400 mg     400 mg 200 mL/hr over 60 Minutes Intravenous Every 12 hours 04/28/13 1716 04/29/13 0902   04/28/13 2100  metroNIDAZOLE (FLAGYL) IVPB 500 mg     500 mg 100 mL/hr over 60 Minutes Intravenous Every 8 hours 04/28/13 1716 04/29/13 2059   04/28/13 1300  metroNIDAZOLE (FLAGYL) IVPB 500 mg  Status:  Discontinued     500 mg 100 mL/hr over 60 Minutes Intravenous Every 8 hours 04/28/13 1148 04/28/13 1716   04/27/13 2100  ciprofloxacin (CIPRO) IVPB 400 mg  Status:  Discontinued     400 mg 200 mL/hr over 60 Minutes Intravenous Every 12 hours 04/27/13 2050 04/28/13 1716       Assessment/Plan Acute gangrenous cholecystitis POD #1 s/p lap chole SOB 1.  Was ready for discharge today, but when trying to wean O2 needs he desaturated to 84% on RA with ambulation.  Thus, we will obtain a CXR which will likely show atelectasis, but can r/o other sources of pulmonary issues.  Start duo nebs.  Ordered CBC and bmet. 2.  D/c IVF, reg diet, pain control 3.  Encouraged ambulating and aggressive IS 4.  SCD's and lovenox 5.  Hopefully d/c tomorrow am    LOS: 2 days    Troy Rodgers, Troy Rodgers 04/29/2013, 2:25 PM Pager: [phone]

## 2013-04-29 NOTE — Progress Notes (Signed)
  Date: 04/29/2013  Patient name: Troy Rodgers.  Medical record number: [id number]  Date of birth: 1945-12-05   This patient has been seen and the plan of care was discussed with the house staff. Please see their note for complete details. I concur with their findings with the following additions/corrections: S/p cholectomy (laparoscopic) Seizure d/o Hypothyroidism  He has distended abd, few bowel sounds. Will defer this to surgery. Could consider changing his anbx to augmentin at d/c. Can f/u with his PCP.   Ginnie Smart, MD 04/29/2013, 10:37 AM

## 2013-04-30 ENCOUNTER — Encounter (HOSPITAL_COMMUNITY): Payer: Self-pay | Admitting: General Surgery

## 2013-04-30 MED ORDER — IPRATROPIUM BROMIDE 0.02 % IN SOLN: 0.5000 mg | Freq: Four times a day (QID) | RESPIRATORY_TRACT | Status: DC

## 2013-04-30 MED ORDER — ALBUTEROL SULFATE (5 MG/ML) 0.5% IN NEBU: 2.5000 mg | INHALATION_SOLUTION | Freq: Four times a day (QID) | RESPIRATORY_TRACT | Status: DC

## 2013-04-30 MED ORDER — ALBUTEROL SULFATE (5 MG/ML) 0.5% IN NEBU: 2.5000 mg | INHALATION_SOLUTION | RESPIRATORY_TRACT | Status: DC | PRN

## 2013-04-30 NOTE — Discharge Summary (Signed)
Patient ID: Troy Rodgers. MRN: [id number] DOB/AGE: 09-30-1945 67 y.o.  Admit date: 04/27/2013 Discharge date: 04/30/2013  Procedures: lap chole  Consults: None  Reason for Admission: 3 day history of abdominal pain. Started sat after diner.Went to ED in [place] and sent home. Went back yesterday with continued pain and sent home. Has syncopal episode at home? Today but not a seizure according to wife. Had pain with U/S. Low grade fever. Gallstones on U/S.   Admission Diagnoses:  1. Acute cholecystitis Patient Active Problem List   Diagnosis Date Noted  . Acute gangrenous cholecystitis 04/28/2013  . Abnormal EKG 04/22/2012  . Other and unspecified hyperlipidemia   . Depressive disorder, not elsewhere classified   . Localization-related (focal) (partial) epilepsy and epileptic syndromes with complex partial seizures, without mention of intractable epilepsy   . Kidney stone   . Impotence of organic origin   . External hemorrhoid   . Nasal septal perforation   . Cataract   . Hyperplasia of prostate     Hospital Course: The patient was admitted and taken to the OR the following day where he underwent a lap chole.  He tolerated this well; however, on POD1 was having some issues weaning off of his oxygen.  He was found to have some atelectasis on CXR.  He was able to wean off while sitting still; however, desaturated with mobilization.  He feels this may be his norm when looking back.  PT was asked to see him for deconditioning.  He was doing better on POD2.  He was stable to go home with close follow up with his PCP.  He was also noted to have 2 small nodules on his CXR for which he will need outpatient CT scan of the chest for.  I have sent his PCP a message regarding this patient.  Discharge Diagnoses:  Principal Problem:   Acute gangrenous cholecystitis acute respiratory failure with mobilization deconditioning  Discharge Medications:   Medication List    STOP taking these  medications       HYDROcodone-acetaminophen 5-325 MG per tablet  Commonly known as:  NORCO/VICODIN      TAKE these medications       aspirin 81 MG EC tablet  Take 81 mg by mouth daily.     atorvastatin 40 MG tablet  Commonly known as:  LIPITOR  Take 40 mg by mouth daily.     escitalopram 20 MG tablet  Commonly known as:  LEXAPRO  Take 20 mg by mouth daily.     fish oil-omega-3 fatty acids 1000 MG capsule  Take 1 g by mouth 2 (two) times daily.     folic acid 1 MG tablet  Commonly known as:  FOLVITE  Take 1 mg by mouth 2 (two) times daily.     lacosamide 200 MG Tabs tablet  Commonly known as:  VIMPAT  Take 200 mg by mouth 2 (two) times daily.     levothyroxine 50 MCG tablet  Commonly known as:  SYNTHROID, LEVOTHROID  Take 50 mcg by mouth daily.     methotrexate 5 MG tablet  Commonly known as:  RHEUMATREX  Take 10 mg by mouth once a week. Caution: Chemotherapy. Protect from light.     omeprazole 20 MG capsule  Commonly known as:  PRILOSEC  Take 40 mg by mouth daily.     oxyCODONE-acetaminophen 5-325 MG per tablet  Commonly known as:  PERCOCET/ROXICET  Take 1-2 tablets by mouth every 6 (six) hours as  needed.     primidone 250 MG tablet  Commonly known as:  MYSOLINE  Take 250 mg by mouth 3 (three) times daily.     Vitamin D3 2000 UNITS Tabs  Take 1 tablet by mouth daily.     ZETIA 10 MG tablet  Generic drug:  ezetimibe  TAKE ONE TABLET BY MOUTH DAILY FOR HYPERLIPIDERMEA        Discharge Instructions:     Follow-up Information   Follow up with [facility] On 05/25/2013. (Your appointment is at 1:45pm, please arrive at least 30 minutes before your appointment to complete your check in paperwork.  If you are unable to arrive 30 minutes prior to your appointment time we may have to cancel or reschedule you.)    Contact information:   [address]   [place] [address] [phone]       Follow up with Rudi Heap, MD On  05/06/2013. (3:30 pm)    Specialty:  Family Medicine   Contact information:   [address] [place] [address] [phone]       Signed: Letha Cape 04/30/2013, 10:11 AM

## 2013-04-30 NOTE — Care Management Note (Signed)
  Page 1 of 1   04/30/2013     1:55:35 PM   CARE MANAGEMENT NOTE 04/30/2013  Patient:  Troy Rodgers, Troy Rodgers   Account Number:  [id number]  Date Initiated:  04/30/2013  Documentation initiated by:  Ronny Flurry  Subjective/Objective Assessment:     Action/Plan:   Anticipated DC Date:  04/30/2013   Anticipated DC Plan:  HOME W HOME HEALTH SERVICES         Choice offered to / List presented to:  C-1 Patient   DME arranged  Levan Hurst      DME agency  [facility]     HH arranged  HH-2 PT      [facility] agency  [facility]   Status of service:  Completed, signed off Medicare Important Message given?   (If response is "NO", the following Medicare IM given date fields will be blank) Date Medicare IM given:   Date Additional Medicare IM given:    Discharge Disposition:    Per UR Regulation:    If discussed at Long Length of Stay Meetings, dates discussed:    Comments:

## 2013-04-30 NOTE — Evaluation (Signed)
Physical Therapy Evaluation Patient Details Name: Troy Rodgers. MRN: [id number] DOB: 10/29/1945 Today's Date: 04/30/2013 Time: 1204-1224 PT Time Calculation (min): 20 min  PT Assessment / Plan / Recommendation History of Present Illness     Clinical Impression  Patient is s/p lap chole surgery resulting in functional limitations due to the deficits listed below (see PT Problem List). Patient will benefit from skilled PT to increase their independence and safety with mobility to allow discharge to the venue listed below. Pt slightly unsteady with gt; seems to be at baseline. Would benefit from RW to increase stability with gt. Will have 24/7 (A) when returning home. Encouraged pt to have (A) for stair amb. Pt is a fall risk. Recommend OPPT for balance upon acute D/C.      PT Assessment  Patient needs continued PT services    Follow Up Recommendations  Outpatient PT;Supervision for mobility/OOB;Supervision - Intermittent    Does the patient have the potential to tolerate intense rehabilitation      Barriers to Discharge        Equipment Recommendations  Rolling walker with 5" wheels    Recommendations for Other Services     Frequency Min 2X/week    Precautions / Restrictions Precautions Precautions: Fall Precaution Comments: denies any recent falls  Restrictions Weight Bearing Restrictions: No   Pertinent Vitals/Pain No complaints       Mobility  Bed Mobility Bed Mobility: Supine to Sit;Sit to Supine;Sitting - Scoot to Edge of Bed Supine to Sit: 6: Modified independent (Device/Increase time);HOB flat Sitting - Scoot to Edge of Bed: 6: Modified independent (Device/Increase time) Sit to Supine: 6: Modified independent (Device/Increase time);HOB flat Details for Bed Mobility Assistance: pt required incr time but was able to complete independently Transfers Transfers: Stand to Sit;Sit to Stand Sit to Stand: 5: Supervision;From bed Stand to Sit: 5: Supervision;To  bed Details for Transfer Assistance: supervision for safety; pt slightly unsteady; no LOB noted Ambulation/Gait Ambulation/Gait Assistance: 4: Min guard;5: Supervision Ambulation Distance (Feet): 130 Feet Assistive device: Rolling walker;None Ambulation/Gait Assistance Details: initially pt amb without RW; pt slightly unsteady but reported to be amb at baseline; pt demo incr stability with RW and progressed to supervision (A) for amb Gait Pattern: Step-through pattern;Narrow base of support;Trunk flexed;Shuffle (forward head posture) Stairs: Yes Stairs Assistance: 4: Min guard Stairs Assistance Details (indicate cue type and reason): min guard for safety; pt with difficulty clearing first step and seemed unaware; pt encouraged to have wife with him and amb up stairs where he has handrails at his home; cues for safety and sequencing Stair Management Technique: No rails;Two rails;Step to pattern;Forwards Number of Stairs: 5 (once with rails and once without ) Wheelchair Mobility Wheelchair Mobility: No         PT Diagnosis: Abnormality of gait  PT Problem List: Decreased balance;Decreased mobility;Decreased safety awareness PT Treatment Interventions: DME instruction;Gait training;Functional mobility training;Stair training;Therapeutic activities;Therapeutic exercise;Balance training;Neuromuscular re-education;Patient/family education     PT Goals(Current goals can be found in the care plan section) Acute Rehab PT Goals Patient Stated Goal: to go home PT Goal Formulation: With patient Time For Goal Achievement: 05/07/13 Potential to Achieve Goals: Good  Visit Information  Last PT Received On: 04/30/13 Assistance Needed: +1       Prior Functioning  Home Living Family/patient expects to be discharged to:: Private residence Living Arrangements: Spouse/significant other Available Help at Discharge: Family;Available 24 hours/day Type of Home: House Home Access: Stairs to  enter [employer] of Steps:  2 Entrance Stairs-Rails: None Home Layout: One level Home Equipment: None Prior Function Level of Independence: Independent Communication Communication: No difficulties    Cognition  Cognition Arousal/Alertness: Awake/alert Behavior During Therapy: WFL for tasks assessed/performed Overall Cognitive Status: Within Functional Limits for tasks assessed    Extremity/Trunk Assessment Upper Extremity Assessment Upper Extremity Assessment: Overall WFL for tasks assessed Lower Extremity Assessment Lower Extremity Assessment: Overall WFL for tasks assessed Cervical / Trunk Assessment Cervical / Trunk Assessment: Kyphotic   Balance Balance Balance Assessed: Yes High Level Balance High Level Balance Activites: Head turns;Sudden stops;Turns;Direction changes High Level Balance Comments: pt unsteady when challenged dynamically  End of Session PT - End of Session Equipment Utilized During Treatment: Gait belt Activity Tolerance: Patient tolerated treatment well Patient left: in bed;with call bell/phone within reach Nurse Communication: Mobility status  GP Functional Assessment Tool Used: clinical judgement Functional Limitation: Mobility: Walking and moving around Mobility: Walking and Moving Around Current Status (U9811): At least 1 percent but less than 20 percent impaired, limited or restricted Mobility: Walking and Moving Around Goal Status [id number]): At least 1 percent but less than 20 percent impaired, limited or restricted Mobility: Walking and Moving Around Discharge Status [id number]): At least 1 percent but less than 20 percent impaired, limited or restricted   Donell Sievert, [place] [phone] 04/30/2013, 12:30 PM

## 2013-05-03 ENCOUNTER — Ambulatory Visit: Payer: Medicare Other

## 2013-05-04 LAB — CULTURE, BLOOD (ROUTINE X 2): Culture: NO GROWTH

## 2013-05-06 ENCOUNTER — Ambulatory Visit (INDEPENDENT_AMBULATORY_CARE_PROVIDER_SITE_OTHER): Payer: Medicare Other | Admitting: Family Medicine

## 2013-05-06 ENCOUNTER — Encounter: Payer: Self-pay | Admitting: Family Medicine

## 2013-05-06 VITALS — BP 112/69 | HR 80 | Temp 98.5°F | Ht 66.0 in | Wt 147.0 lb

## 2013-05-06 DIAGNOSIS — R7989 Other specified abnormal findings of blood chemistry: Secondary | ICD-10-CM

## 2013-05-06 DIAGNOSIS — E785 Hyperlipidemia, unspecified: Secondary | ICD-10-CM

## 2013-05-06 DIAGNOSIS — R269 Unspecified abnormalities of gait and mobility: Secondary | ICD-10-CM

## 2013-05-06 DIAGNOSIS — Z9089 Acquired absence of other organs: Secondary | ICD-10-CM

## 2013-05-06 DIAGNOSIS — Z9049 Acquired absence of other specified parts of digestive tract: Secondary | ICD-10-CM

## 2013-05-06 DIAGNOSIS — G40909 Epilepsy, unspecified, not intractable, without status epilepticus: Secondary | ICD-10-CM | POA: Insufficient documentation

## 2013-05-06 DIAGNOSIS — G40209 Localization-related (focal) (partial) symptomatic epilepsy and epileptic syndromes with complex partial seizures, not intractable, without status epilepticus: Secondary | ICD-10-CM

## 2013-05-06 DIAGNOSIS — E559 Vitamin D deficiency, unspecified: Secondary | ICD-10-CM

## 2013-05-06 DIAGNOSIS — R911 Solitary pulmonary nodule: Secondary | ICD-10-CM

## 2013-05-06 DIAGNOSIS — R2681 Unsteadiness on feet: Secondary | ICD-10-CM

## 2013-05-06 LAB — POCT CBC
Granulocyte percent: 71.2 %G (ref 37–80)
Hemoglobin: 12.3 g/dL — AB (ref 14.1–18.1)
MCHC: 32.8 g/dL (ref 31.8–35.4)
MPV: 7 fL (ref 0–99.8)
POC Granulocyte: 5.8 (ref 2–6.9)
POC LYMPH PERCENT: 19.7 %L (ref 10–50)
Platelet Count, POC: 591 10*3/uL — AB (ref 142–424)
RBC: 3.7 M/uL — AB (ref 4.69–6.13)
RDW, POC: 13.9 %
WBC: 8.2 10*3/uL (ref 4.6–10.2)

## 2013-05-06 NOTE — Patient Instructions (Addendum)
Continue current medications. Continue good therapeutic lifestyle changes.  Fall precautions discussed with patient. Follow up as planned and earlier as needed.  We will schedule a physical therapy visit for you once the surgeon released as you Someone will call from rehabilitation to schedule this visit We will also followup on in the neck CT scan is needed because of the pulmonary nodule Drink plenty of fluids, continue to increase her activity gradually

## 2013-05-06 NOTE — Progress Notes (Signed)
Subjective:    Patient ID: Troy Rodgers., male    DOB: 1945-12-30, 67 y.o.   MRN: [id number]  HPI Patient here today for follow up from recent hospitalization- [place] gallbladder removal on 04-28-13. Patient comes in today with his wife. The history of the recent admission was reviewed with his wife and the patient. He has a followup visit with the surgeon on November 20. He is currently being followed at home by home health. He has had difficulty with weakness in his lower extremities and he is using a walker at home. His appetite is improving and he is more active. He is having loose bowel movements which he understands is expected following the surgery.    Patient Active Problem List   Diagnosis Date Noted  . Acute gangrenous cholecystitis 04/28/2013  . Abnormal EKG 04/22/2012  . Other and unspecified hyperlipidemia   . Depressive disorder, not elsewhere classified   . Localization-related (focal) (partial) epilepsy and epileptic syndromes with complex partial seizures, without mention of intractable epilepsy   . Kidney stone   . Impotence of organic origin   . External hemorrhoid   . Nasal septal perforation   . Cataract   . Hyperplasia of prostate    Outpatient Encounter Prescriptions as of 05/06/2013  Medication Sig Dispense Refill  . aspirin 81 MG EC tablet Take 81 mg by mouth daily.        Marland Kitchen atorvastatin (LIPITOR) 40 MG tablet Take 40 mg by mouth daily.       . Cholecalciferol (VITAMIN D3) 2000 UNITS TABS Take 1 tablet by mouth daily.        Marland Kitchen escitalopram (LEXAPRO) 20 MG tablet Take 20 mg by mouth daily.        . fish oil-omega-3 fatty acids 1000 MG capsule Take 1 g by mouth 2 (two) times daily.       . folic acid (FOLVITE) 1 MG tablet Take 1 mg by mouth 2 (two) times daily.       Marland Kitchen lacosamide (VIMPAT) 200 MG TABS tablet Take 200 mg by mouth 2 (two) times daily.      Marland Kitchen levothyroxine (SYNTHROID, LEVOTHROID) 50 MCG tablet Take 50 mcg by mouth daily.      . methotrexate  (RHEUMATREX) 5 MG tablet Take 10 mg by mouth once a week. Caution: Chemotherapy. Protect from light.      Marland Kitchen omeprazole (PRILOSEC) 20 MG capsule Take 20 mg by mouth daily.       . primidone (MYSOLINE) 250 MG tablet Take 250 mg by mouth 3 (three) times daily.       Marland Kitchen ZETIA 10 MG tablet TAKE ONE TABLET BY MOUTH DAILY FOR HYPERLIPIDERMEA  90 tablet  1  . oxyCODONE-acetaminophen (PERCOCET/ROXICET) 5-325 MG per tablet Take 1-2 tablets by mouth every 6 (six) hours as needed.  40 tablet  0   No facility-administered encounter medications on file as of 05/06/2013.    Review of Systems  HENT: Negative.   Eyes: Negative.   Respiratory: Negative.   Cardiovascular: Negative.   Gastrointestinal: Negative.   Endocrine: Negative.   Genitourinary: Negative.   Musculoskeletal: Negative.   Skin: Negative.   Allergic/Immunologic: Negative.   Neurological: Positive for weakness (off balance when walking). Negative for dizziness.  Hematological: Negative.   Psychiatric/Behavioral: Negative.        Objective:   Physical Exam  Nursing note and vitals reviewed. Constitutional: He is oriented to person, place, and time. He appears well-developed and well-nourished.  No distress.  HENT:  Head: Normocephalic and atraumatic.  Mouth/Throat: Oropharynx is clear and moist.  Eyes: Conjunctivae and EOM are normal. Right eye exhibits no discharge. Left eye exhibits no discharge. No scleral icterus.  Neck: Normal range of motion. Neck supple. No thyromegaly present.  Cardiovascular: Normal rate, regular rhythm and normal heart sounds.  Exam reveals no gallop and no friction rub.   No murmur heard. Pulmonary/Chest: Effort normal and breath sounds normal. No respiratory distress. He has no wheezes. He has no rales. He exhibits no tenderness.  Abdominal: Soft. Bowel sounds are normal. He exhibits no mass. There is no tenderness. There is no rebound and no guarding.  Gen. tenderness secondary to cholecystectomy  surgery  Musculoskeletal: He exhibits no edema and no tenderness.  Range of motion somewhat hesitant but almost normal  Lymphadenopathy:    He has no cervical adenopathy.  Neurological: He is alert and oriented to person, place, and time. He has normal reflexes.  Skin: Skin is warm and dry. No rash noted. No erythema. No pallor.  Psychiatric: He has a normal mood and affect. His behavior is normal. Judgment and thought content normal.   BP 112/69  Pulse 80  Temp(Src) 98.5 F (36.9 C) (Oral)  Ht 5\' 6"  (1.676 m)  Wt 147 lb (66.679 kg)  BMI 23.74 kg/m2        Assessment & Plan:    1. History of cholecystectomy   2. Hyperlipidemia   3. Localization-related (focal) (partial) epilepsy and epileptic syndromes with complex partial seizures, without mention of intractable epilepsy   4. Vitamin D deficiency   5. Gait instability   6. Pulmonary nodule seen on imaging study   7. Seizure disorder    Orders Placed This Encounter  Procedures  . Lipid panel  . Hepatic function panel  . BMP8+EGFR  . Vit D  25 hydroxy (rtn osteoporosis monitoring)  . Ambulatory referral to Physical Therapy    Referral Priority:  Routine    Referral Type:  Physical Medicine    Referral Reason:  Specialty Services Required    Requested Specialty:  Physical Therapy    Number of Visits Requested:  1  . POCT CBC   No orders of the defined types were placed in this encounter.   Patient Instructions  Continue current medications. Continue good therapeutic lifestyle changes.  Fall precautions discussed with patient. Follow up as planned and earlier as needed.  We will schedule a physical therapy visit for you once the surgeon released as you Someone will call from rehabilitation to schedule this visit We will also followup on in the neck CT scan is needed because of the pulmonary nodule Drink plenty of fluids, continue to increase her activity gradually   Nyra Capes MD

## 2013-05-06 NOTE — Addendum Note (Signed)
Addended by: Orma Render F on: 05/06/2013 05:17 PM   Modules accepted: Orders

## 2013-05-07 LAB — HEPATIC FUNCTION PANEL
ALT: 44 IU/L (ref 0–44)
Albumin: 3.7 g/dL (ref 3.6–4.8)
Bilirubin, Direct: 0.07 mg/dL (ref 0.00–0.40)
Total Bilirubin: <0.2 mg/dL (ref 0.0–1.2)
Total Protein: 7.7 g/dL (ref 6.0–8.5)

## 2013-05-07 LAB — CBC WITH DIFFERENTIAL
Basophils Absolute: 0 10*3/uL (ref 0.0–0.2)
Eosinophils Absolute: 0.1 10*3/uL (ref 0.0–0.4)
Immature Grans (Abs): 0 10*3/uL (ref 0.0–0.1)
Immature Granulocytes: 0 %
Lymphs: 16 %
MCHC: 34.2 g/dL (ref 31.5–35.7)
MCV: 100 fL — ABNORMAL HIGH (ref 79–97)
Monocytes: 15 %
Neutrophils Absolute: 5.1 10*3/uL (ref 1.4–7.0)
Neutrophils Relative %: 66 %
Platelets: 589 10*3/uL — ABNORMAL HIGH (ref 150–379)
RBC: 3.45 x10E6/uL — ABNORMAL LOW (ref 4.14–5.80)
RDW: 13.8 % (ref 12.3–15.4)
WBC: 7.5 10*3/uL (ref 3.4–10.8)

## 2013-05-07 LAB — BMP8+EGFR
BUN/Creatinine Ratio: 14 (ref 10–22)
Calcium: 9 mg/dL (ref 8.6–10.2)
Creatinine, Ser: 0.92 mg/dL (ref 0.76–1.27)
GFR calc Af Amer: 99 mL/min/{1.73_m2} (ref >59)
GFR calc non Af Amer: 86 mL/min/{1.73_m2} (ref >59)
Glucose: 117 mg/dL — ABNORMAL HIGH (ref 65–99)
Potassium: 5 mmol/L (ref 3.5–5.2)

## 2013-05-07 LAB — LIPID PANEL
Chol/HDL Ratio: 3.5 ratio units (ref 0.0–5.0)
HDL: 26 mg/dL — ABNORMAL LOW (ref >39)
LDL Calculated: 45 mg/dL (ref 0–99)
Triglycerides: 100 mg/dL (ref 0–149)

## 2013-05-07 LAB — VITAMIN D 25 HYDROXY (VIT D DEFICIENCY, FRACTURES): Vit D, 25-Hydroxy: 66.9 ng/mL (ref 30.0–100.0)

## 2013-05-11 ENCOUNTER — Ambulatory Visit: Payer: Medicare Other

## 2013-05-13 ENCOUNTER — Other Ambulatory Visit (INDEPENDENT_AMBULATORY_CARE_PROVIDER_SITE_OTHER): Payer: Medicare Other

## 2013-05-13 DIAGNOSIS — R5381 Other malaise: Secondary | ICD-10-CM

## 2013-05-13 DIAGNOSIS — R5383 Other fatigue: Secondary | ICD-10-CM

## 2013-05-13 LAB — POCT CBC
Granulocyte percent: 56.3 %G (ref 37–80)
HCT, POC: 36.1 % — AB (ref 43.5–53.7)
MCH, POC: 33.5 pg — AB (ref 27–31.2)
MCV: 99.1 fL — AB (ref 80–97)
POC LYMPH PERCENT: 34.5 %L (ref 10–50)
RBC: 3.7 M/uL — AB (ref 4.69–6.13)
RDW, POC: 13.9 %
WBC: 9.3 10*3/uL (ref 4.6–10.2)

## 2013-05-13 NOTE — Progress Notes (Signed)
Labs only

## 2013-05-14 ENCOUNTER — Telehealth: Payer: Self-pay | Admitting: *Deleted

## 2013-05-14 ENCOUNTER — Telehealth: Payer: Self-pay | Admitting: Family Medicine

## 2013-05-14 ENCOUNTER — Other Ambulatory Visit (INDEPENDENT_AMBULATORY_CARE_PROVIDER_SITE_OTHER): Payer: Medicare Other

## 2013-05-14 ENCOUNTER — Other Ambulatory Visit: Payer: Self-pay | Admitting: *Deleted

## 2013-05-14 DIAGNOSIS — R7989 Other specified abnormal findings of blood chemistry: Secondary | ICD-10-CM

## 2013-05-14 DIAGNOSIS — D473 Essential (hemorrhagic) thrombocythemia: Secondary | ICD-10-CM

## 2013-05-14 LAB — POCT CBC
Granulocyte percent: 59.6 %G (ref 37–80)
Hemoglobin: 12 g/dL — AB (ref 14.1–18.1)
Lymph, poc: 2.7 (ref 0.6–3.4)
MCV: 100.2 fL — AB (ref 80–97)
MPV: 7.9 fL (ref 0–99.8)
POC LYMPH PERCENT: 32.9 %L (ref 10–50)
Platelet Count, POC: 687 10*3/uL — AB (ref 142–424)
RBC: 3.6 M/uL — AB (ref 4.69–6.13)
WBC: 8.2 10*3/uL (ref 4.6–10.2)

## 2013-05-14 NOTE — Progress Notes (Signed)
Tried to contact patient's wife with order details but had to leave a message on her machine.

## 2013-05-14 NOTE — Telephone Encounter (Signed)
Patient returned for CBC today. Platelets have decreased to 695. Referral to hematology is in place but we may be able to cancel this pending follow-up labs. Wife spoke with surgeon and was told that increased platelet counts can occur after surgery but that we should proceed with our plan. I suspect he will need to repeat the CBC on Monday but we will wait for Dr. Christell Constant to review the labs before deciding.

## 2013-05-18 ENCOUNTER — Other Ambulatory Visit (INDEPENDENT_AMBULATORY_CARE_PROVIDER_SITE_OTHER): Payer: Medicare Other

## 2013-05-18 DIAGNOSIS — R5381 Other malaise: Secondary | ICD-10-CM

## 2013-05-18 DIAGNOSIS — R5383 Other fatigue: Secondary | ICD-10-CM

## 2013-05-18 LAB — POCT CBC
Lymph, poc: 2.1 (ref 0.6–3.4)
MCH, POC: 33.4 pg — AB (ref 27–31.2)
MCHC: 33.6 g/dL (ref 31.8–35.4)
MPV: 7.7 fL (ref 0–99.8)
POC Granulocyte: 4.8 (ref 2–6.9)
POC LYMPH PERCENT: 28.5 %L (ref 10–50)
Platelet Count, POC: 516 10*3/uL — AB (ref 142–424)

## 2013-05-18 NOTE — Progress Notes (Signed)
Patient came in for re-check WBCs

## 2013-05-18 NOTE — Telephone Encounter (Signed)
This was taken care of on 05/14/13

## 2013-05-25 ENCOUNTER — Ambulatory Visit (INDEPENDENT_AMBULATORY_CARE_PROVIDER_SITE_OTHER): Payer: Medicare Other | Admitting: General Surgery

## 2013-05-25 ENCOUNTER — Encounter (INDEPENDENT_AMBULATORY_CARE_PROVIDER_SITE_OTHER): Payer: Self-pay

## 2013-05-25 VITALS — BP 100/68 | HR 64 | Temp 97.8°F | Resp 14 | Ht 66.0 in | Wt 149.8 lb

## 2013-05-25 DIAGNOSIS — K810 Acute cholecystitis: Secondary | ICD-10-CM

## 2013-05-25 NOTE — Patient Instructions (Signed)
Call if you have a problem. 

## 2013-05-25 NOTE — Progress Notes (Signed)
SADIQ MCCAULEY [birth year] [id number] 05/25/2013   Aloha Gell Demetrios Byron. is a 67 y.o. male who had a laparoscopic cholecystectomy with intraoperative cholangiogram by Dr. Axel Filler, MD .  The pathology report confirmed Gallbladder - CHRONIC ACTIVE CHOLECYSTITIS. - NEGATIVE FOR CHOLELITHIASIS.Marland Kitchen  The patient reports that they are feeling well with normal bowel movements and good appetite.  The pre-operative symptoms of abdominal pain, nausea, and vomiting have resolved.    Physical examination: BP 100/68  Pulse 64  Temp(Src) 97.8 F (36.6 C) (Temporal)  Resp 14  Ht 5\' 6"  (1.676 m)  Wt 67.949 kg (149 lb 12.8 oz)  BMI 24.19 kg/m2 - Incisions appear well-healed with no sign of infection or bleeding.   Abdomen - soft, non-tender  Impression:  s/p laparoscopic cholecystectomy  Plan:  He may resume a regular diet and full activity.  He may follow-up on a PRN basis.

## 2013-06-03 ENCOUNTER — Other Ambulatory Visit: Payer: Self-pay | Admitting: Family Medicine

## 2013-06-09 ENCOUNTER — Other Ambulatory Visit: Payer: Self-pay | Admitting: Family Medicine

## 2013-06-14 NOTE — Telephone Encounter (Signed)
Last Thyroid labs were 06/07/12, has appt 02/15

## 2013-06-14 NOTE — Telephone Encounter (Signed)
Have patient come by and get a thyroid profile, but  refill medication for 6 months

## 2013-07-01 ENCOUNTER — Ambulatory Visit: Payer: Medicare Other | Admitting: Family Medicine

## 2013-07-14 ENCOUNTER — Encounter: Payer: Self-pay | Admitting: *Deleted

## 2013-08-17 ENCOUNTER — Ambulatory Visit: Payer: Medicare Other | Admitting: Family Medicine

## 2013-08-19 ENCOUNTER — Encounter: Payer: Self-pay | Admitting: Family Medicine

## 2013-08-19 ENCOUNTER — Ambulatory Visit (INDEPENDENT_AMBULATORY_CARE_PROVIDER_SITE_OTHER): Payer: Medicare Other | Admitting: Family Medicine

## 2013-08-19 ENCOUNTER — Encounter (INDEPENDENT_AMBULATORY_CARE_PROVIDER_SITE_OTHER): Payer: Self-pay | Admitting: *Deleted

## 2013-08-19 ENCOUNTER — Encounter (INDEPENDENT_AMBULATORY_CARE_PROVIDER_SITE_OTHER): Payer: Self-pay

## 2013-08-19 VITALS — BP 126/72 | HR 67 | Temp 98.8°F | Ht 66.0 in | Wt 151.0 lb

## 2013-08-19 DIAGNOSIS — Z23 Encounter for immunization: Secondary | ICD-10-CM

## 2013-08-19 DIAGNOSIS — E559 Vitamin D deficiency, unspecified: Secondary | ICD-10-CM

## 2013-08-19 DIAGNOSIS — Z79899 Other long term (current) drug therapy: Secondary | ICD-10-CM | POA: Insufficient documentation

## 2013-08-19 DIAGNOSIS — N400 Enlarged prostate without lower urinary tract symptoms: Secondary | ICD-10-CM

## 2013-08-19 DIAGNOSIS — M069 Rheumatoid arthritis, unspecified: Secondary | ICD-10-CM | POA: Insufficient documentation

## 2013-08-19 DIAGNOSIS — E785 Hyperlipidemia, unspecified: Secondary | ICD-10-CM

## 2013-08-19 DIAGNOSIS — G40909 Epilepsy, unspecified, not intractable, without status epilepticus: Secondary | ICD-10-CM

## 2013-08-19 LAB — POCT CBC
Granulocyte percent: 69.3 %G (ref 37–80)
HCT, POC: 39.3 % — AB (ref 43.5–53.7)
Hemoglobin: 12.5 g/dL — AB (ref 14.1–18.1)
Lymph, poc: 2.1 (ref 0.6–3.4)
MCH: 31.8 pg — AB (ref 27–31.2)
MCHC: 31.8 g/dL (ref 31.8–35.4)
MCV: 100 fL — AB (ref 80–97)
MPV: 7.7 fL (ref 0–99.8)
PLATELET COUNT, POC: 312 10*3/uL (ref 142–424)
POC GRANULOCYTE: 5.2 (ref 2–6.9)
POC LYMPH %: 28 % (ref 10–50)
RBC: 3.9 M/uL — AB (ref 4.69–6.13)
RDW, POC: 15.1 %
WBC: 7.5 10*3/uL (ref 4.6–10.2)

## 2013-08-19 NOTE — Patient Instructions (Addendum)
Continue current medications. Continue good therapeutic lifestyle changes which include good diet and exercise. Fall precautions discussed with patient. Schedule your flu vaccine if you haven't had it yet If you are over 68 years old - you may need Prevnar 84 or the adult Pneumonia vaccine. Prevnar vaccine that he received today may make her arm sore Please return the FOBT. We will call you with the results of the lab work were sent results are available.

## 2013-08-19 NOTE — Progress Notes (Signed)
Subjective:    Patient ID: Troy Genre., male    DOB: [birth year], 68 y.o.   MRN: [id number]  HPI Pt here for follow up and management of chronic medical problems.  This patient is also followed by the neurologist for his seizures. He has seen Dr. Melony Overly for his colonoscopy and endoscopy. He has seen Dr. Percival Spanish the cardiologist in the past. He is followed by Dr. Rondel Oh for his rheumatoid arthritis. We will try to confirm with the  gastroenterologist when he needs his next colonoscopy.       Patient Active Problem List   Diagnosis Date Noted  . Seizure disorder 05/06/2013  . Acute gangrenous cholecystitis 04/28/2013  . Abnormal EKG 04/22/2012  . Other and unspecified hyperlipidemia   . Depressive disorder, not elsewhere classified   . Localization-related (focal) (partial) epilepsy and epileptic syndromes with complex partial seizures, without mention of intractable epilepsy   . Kidney stone   . Impotence of organic origin   . External hemorrhoid   . Nasal septal perforation   . Cataract   . Hyperplasia of prostate    Outpatient Encounter Prescriptions as of 08/19/2013  Medication Sig  . aspirin 81 MG EC tablet Take 81 mg by mouth daily.    Marland Kitchen atorvastatin (LIPITOR) 40 MG tablet TAKE 1 TABLET BY MOUTH EVERY DAY AS DIRECTED  . Cholecalciferol (VITAMIN D3) 2000 UNITS TABS Take 1 tablet by mouth daily.    Marland Kitchen escitalopram (LEXAPRO) 20 MG tablet Take 20 mg by mouth daily.    . fish oil-omega-3 fatty acids 1000 MG capsule Take 1 g by mouth 2 (two) times daily.   . folic acid (FOLVITE) 1 MG tablet Take 1 mg by mouth 2 (two) times daily.   Marland Kitchen lacosamide (VIMPAT) 200 MG TABS tablet Take 200 mg by mouth 2 (two) times daily.  Marland Kitchen levothyroxine (SYNTHROID, LEVOTHROID) 50 MCG tablet TAKE ONE TABLET BY MOUTH EVERY DAY  . methotrexate (RHEUMATREX) 5 MG tablet Take 10 mg by mouth once a week. Caution: Chemotherapy. Protect from light.  Marland Kitchen omeprazole (PRILOSEC) 20 MG capsule Take 20 mg by mouth  daily.   . primidone (MYSOLINE) 250 MG tablet Take 250 mg by mouth 3 (three) times daily.   Marland Kitchen ZETIA 10 MG tablet TAKE ONE TABLET BY MOUTH DAILY FOR HYPERLIPIDERMEA    Review of Systems  Constitutional: Negative.   HENT: Negative.   Eyes: Negative.   Respiratory: Negative.   Cardiovascular: Negative.   Gastrointestinal: Negative.   Endocrine: Negative.   Genitourinary: Negative.   Musculoskeletal: Negative.   Skin: Negative.        Dry skin  Allergic/Immunologic: Negative.   Neurological: Negative.   Hematological: Negative.   Psychiatric/Behavioral: Negative.        Objective:   Physical Exam  Nursing note and vitals reviewed. Constitutional: He is oriented to person, place, and time. He appears well-developed and well-nourished. No distress.  HENT:  Head: Normocephalic and atraumatic.  Right Ear: External ear normal.  Left Ear: External ear normal.  Nose: Nose normal.  Mouth/Throat: Oropharynx is clear and moist. No oropharyngeal exudate.  Hearing aids in both ears  Eyes: Conjunctivae and EOM are normal. Pupils are equal, round, and reactive to light. Right eye exhibits no discharge. Left eye exhibits no discharge. No scleral icterus.  Neck: Normal range of motion. Neck supple. No thyromegaly present.  Cardiovascular: Normal rate, regular rhythm, normal heart sounds and intact distal pulses.  Exam reveals no gallop and no  friction rub.   No murmur heard. At 72 per minute  Pulmonary/Chest: Effort normal and breath sounds normal. No respiratory distress. He has no wheezes. He has no rales. He exhibits no tenderness.  Abdominal: Soft. Bowel sounds are normal. He exhibits no mass. There is no tenderness. There is no rebound and no guarding.  Genitourinary: Rectum normal and penis normal.  Minimal prostate enlargement. No rectal masses. No inguinal hernia. No inguinal nodes. External genitalia within normal limits  Musculoskeletal: Normal range of motion. He exhibits no edema  and no tenderness.  Lymphadenopathy:    He has no cervical adenopathy.  Neurological: He is alert and oriented to person, place, and time. He has normal reflexes. No cranial nerve deficit.  Skin: Skin is warm and dry. No rash noted. No erythema. No pallor.  Psychiatric: He has a normal mood and affect. His behavior is normal. Judgment and thought content normal.   BP 126/72  Pulse 67  Temp(Src) 98.8 F (37.1 C) (Oral)  Ht '5\' 6"'  (1.676 m)  Wt 151 lb (68.493 kg)  BMI 24.38 kg/m2        Assessment & Plan:   1. Seizure disorder - POCT CBC  2. Other and unspecified hyperlipidemia - POCT CBC - BMP8+EGFR - Hepatic function panel - Lipid panel  3. Vitamin D deficiency - Vit D  25 hydroxy (rtn osteoporosis monitoring)  4. Rheumatoid arthritis -Continue to follow with Dr. Amil Amen  5. High risk medication use  6. BPH (benign prostatic hyperplasia)   Patient Instructions  Continue current medications. Continue good therapeutic lifestyle changes which include good diet and exercise. Fall precautions discussed with patient. Schedule your flu vaccine if you haven't had it yet If you are over 10 years old - you may need Prevnar 57 or the adult Pneumonia vaccine. Prevnar vaccine that he received today may make her arm sore Please return the FOBT. We will call you with the results of the lab work were sent results are available.   Check of the gastroenterologist on the day of your next colonoscopy. Arrie Senate MD

## 2013-08-20 LAB — BMP8+EGFR
BUN/Creatinine Ratio: 13 (ref 10–22)
BUN: 14 mg/dL (ref 8–27)
CALCIUM: 9.1 mg/dL (ref 8.6–10.2)
CHLORIDE: 97 mmol/L (ref 97–108)
CO2: 30 mmol/L — ABNORMAL HIGH (ref 18–29)
Creatinine, Ser: 1.04 mg/dL (ref 0.76–1.27)
GFR calc Af Amer: 85 mL/min/{1.73_m2} (ref >59)
GFR calc non Af Amer: 74 mL/min/{1.73_m2} (ref >59)
GLUCOSE: 89 mg/dL (ref 65–99)
POTASSIUM: 4.4 mmol/L (ref 3.5–5.2)
Sodium: 141 mmol/L (ref 134–144)

## 2013-08-20 LAB — LIPID PANEL
CHOLESTEROL TOTAL: 138 mg/dL (ref 100–199)
Chol/HDL Ratio: 2.3 ratio units (ref 0.0–5.0)
HDL: 60 mg/dL (ref >39)
LDL CALC: 63 mg/dL (ref 0–99)
Triglycerides: 73 mg/dL (ref 0–149)
VLDL CHOLESTEROL CAL: 15 mg/dL (ref 5–40)

## 2013-08-20 LAB — HEPATIC FUNCTION PANEL
ALT: 27 IU/L (ref 0–44)
AST: 34 IU/L (ref 0–40)
Albumin: 4.5 g/dL (ref 3.6–4.8)
Alkaline Phosphatase: 112 IU/L (ref 39–117)
Bilirubin, Direct: 0.07 mg/dL (ref 0.00–0.40)
TOTAL PROTEIN: 7.2 g/dL (ref 6.0–8.5)
Total Bilirubin: 0.2 mg/dL (ref 0.0–1.2)

## 2013-08-20 LAB — VITAMIN D 25 HYDROXY (VIT D DEFICIENCY, FRACTURES): Vit D, 25-Hydroxy: 55.9 ng/mL (ref 30.0–100.0)

## 2013-08-23 ENCOUNTER — Telehealth: Payer: Self-pay | Admitting: *Deleted

## 2013-08-23 DIAGNOSIS — R911 Solitary pulmonary nodule: Secondary | ICD-10-CM

## 2013-08-23 NOTE — Addendum Note (Signed)
Addended by: Zannie Cove on: 08/23/2013 02:41 PM   Modules accepted: Orders

## 2013-08-23 NOTE — Telephone Encounter (Signed)
Ct chest ordered and wife aware they will be getting a call soon on date and time to go to [facility] for ct - going to [facility] because he has had one CT there already and they can compare the two.

## 2013-09-07 ENCOUNTER — Other Ambulatory Visit: Payer: Self-pay | Admitting: *Deleted

## 2013-09-07 DIAGNOSIS — R911 Solitary pulmonary nodule: Secondary | ICD-10-CM

## 2013-09-08 ENCOUNTER — Other Ambulatory Visit: Payer: Self-pay | Admitting: *Deleted

## 2013-09-08 DIAGNOSIS — R911 Solitary pulmonary nodule: Secondary | ICD-10-CM

## 2013-09-09 ENCOUNTER — Other Ambulatory Visit: Payer: Self-pay | Admitting: Family Medicine

## 2013-09-14 ENCOUNTER — Telehealth: Payer: Self-pay | Admitting: Family Medicine

## 2013-09-14 NOTE — Telephone Encounter (Signed)
Last seen 08/19/13  DWM  No thyroid labs since EPIC

## 2013-10-12 ENCOUNTER — Encounter: Payer: Self-pay | Admitting: Internal Medicine

## 2013-10-12 ENCOUNTER — Ambulatory Visit (INDEPENDENT_AMBULATORY_CARE_PROVIDER_SITE_OTHER): Payer: Medicare Other | Admitting: Internal Medicine

## 2013-10-12 ENCOUNTER — Institutional Professional Consult (permissible substitution): Payer: Self-pay | Admitting: Critical Care Medicine

## 2013-10-12 VITALS — BP 114/66 | HR 66 | Temp 98.7°F | Ht 66.0 in | Wt 152.0 lb

## 2013-10-12 DIAGNOSIS — R911 Solitary pulmonary nodule: Secondary | ICD-10-CM

## 2013-10-12 NOTE — Patient Instructions (Signed)
You are very low risk that this nodule is a tumor - I will call after review with radiology to see if PET needed   Please schedule a follow up visit in 3 months but call sooner if needed with cxr

## 2013-10-12 NOTE — Progress Notes (Signed)
   Subjective:    Patient ID: Troy Rodgers., male    DOB: 1945-08-01  MRN: [id number]  HPI   77 yowm never smoker with RA on MTX and acute RUQ pain mid oct 2014 c/w GB dz but    CT abd also  spn > CT f/u 08/23/13 same nodule, no change, referred 10/12/2013 by Dr [name] to pulmonary clinic.   10/12/2013 1st [facility] office visit/ Rubena Roseman cc cp/abd completely resolved with lap chole 04/27/14 . Not limited by breathing from desired activities    No obvious other patterns in day to day or daytime variabilty or assoc chronic cough or cp or chest tightness, subjective wheeze overt sinus or hb symptoms. No unusual exp hx or h/o childhood pna/ asthma or knowledge of premature birth.  Sleeping ok without nocturnal  or early am exacerbation  of respiratory  c/o's or need for noct saba. Also denies any obvious fluctuation of symptoms with weather or environmental changes or other aggravating or alleviating factors except as outlined above   Current Medications, Allergies, Complete Past Medical History, Past Surgical History, Family History, and Social History were reviewed in [employer] record.             Review of Systems  Constitutional: Negative for fever, chills, diaphoresis, activity change, appetite change, fatigue and unexpected weight change.  HENT: Positive for congestion and trouble swallowing. Negative for dental problem, ear discharge, ear pain, facial swelling, hearing loss, mouth sores, nosebleeds, postnasal drip, rhinorrhea, sinus pressure, sneezing, sore throat, tinnitus and voice change.   Eyes: Negative for photophobia, discharge, itching and visual disturbance.  Respiratory: Negative for apnea, cough, choking, chest tightness, shortness of breath, wheezing and stridor.   Cardiovascular: Negative for chest pain, palpitations and leg swelling.  Gastrointestinal: Negative for nausea, vomiting, abdominal pain, constipation, blood in stool and abdominal  distention.  Genitourinary: Negative for dysuria, urgency, frequency, hematuria, flank pain, decreased urine volume and difficulty urinating.  Musculoskeletal: Positive for arthralgias and joint swelling. Negative for back pain, gait problem, myalgias, neck pain and neck stiffness.  Skin: Negative for color change, pallor and rash.  Neurological: Negative for dizziness, tremors, seizures, syncope, speech difficulty, weakness, light-headedness, numbness and headaches.  Hematological: Negative for adenopathy. Does not bruise/bleed easily.  Psychiatric/Behavioral: Negative for confusion, sleep disturbance and agitation. The patient is not nervous/anxious.        Objective:   Physical Exam  amb wm nad  Wt Readings from Last 3 Encounters:  10/12/13 152 lb (68.947 kg)  08/19/13 151 lb (68.493 kg)  05/25/13 149 lb 12.8 oz (67.949 kg)     HEENT: nl dentition, turbinates, and orophanx. Nl external ear canals without cough reflex   NECK :  without JVD/Nodes/TM/ nl carotid upstrokes bilaterally   LUNGS: no acc muscle use, clear to A and P bilaterally without cough on insp or exp maneuvers   CV:  RRR  no s3 or murmur or increase in P2, no edema   ABD:  soft and nontender with nl excursion in the supine position. No bruits or organomegaly, bowel sounds nl  MS:  warm without deformities, calf tenderness, cyanosis or clubbing- no obvious RA nodules   SKIN: warm and dry without lesions    NEURO:  alert, approp, no deficits          Assessment & Plan:

## 2013-10-12 NOTE — Assessment & Plan Note (Signed)
-   never smoker - first seen 05/2012 plain film - CT chest 09/08/13 no change on sagittal cuts vs 06/04/12 lateral view  He has RA so this could be an old RA nodule but no obvious change x 1.5 years to low but not zero chance of malignancy here  Discussed in detail all the  indications, usual  risks and alternatives  relative to the benefits with patient who agrees to proceed with conservative f/u with cxr

## 2013-10-14 ENCOUNTER — Other Ambulatory Visit: Payer: Self-pay | Admitting: *Deleted

## 2013-10-14 MED ORDER — LEVOTHYROXINE SODIUM 50 MCG PO TABS: ORAL_TABLET | ORAL | Status: DC

## 2013-11-26 ENCOUNTER — Other Ambulatory Visit: Payer: Self-pay | Admitting: Family Medicine

## 2013-12-22 ENCOUNTER — Ambulatory Visit (INDEPENDENT_AMBULATORY_CARE_PROVIDER_SITE_OTHER): Payer: Medicare Other | Admitting: Family Medicine

## 2013-12-22 ENCOUNTER — Encounter: Payer: Self-pay | Admitting: Family Medicine

## 2013-12-22 ENCOUNTER — Telehealth: Payer: Self-pay | Admitting: *Deleted

## 2013-12-22 VITALS — BP 112/67 | HR 61 | Temp 99.1°F | Ht 66.0 in | Wt 152.0 lb

## 2013-12-22 DIAGNOSIS — Z79899 Other long term (current) drug therapy: Secondary | ICD-10-CM

## 2013-12-22 DIAGNOSIS — E559 Vitamin D deficiency, unspecified: Secondary | ICD-10-CM

## 2013-12-22 DIAGNOSIS — E785 Hyperlipidemia, unspecified: Secondary | ICD-10-CM

## 2013-12-22 DIAGNOSIS — M069 Rheumatoid arthritis, unspecified: Secondary | ICD-10-CM

## 2013-12-22 DIAGNOSIS — G40909 Epilepsy, unspecified, not intractable, without status epilepticus: Secondary | ICD-10-CM

## 2013-12-22 DIAGNOSIS — N400 Enlarged prostate without lower urinary tract symptoms: Secondary | ICD-10-CM

## 2013-12-22 LAB — POCT CBC
GRANULOCYTE PERCENT: 57.8 % (ref 37–80)
HEMATOCRIT: 39.4 % — AB (ref 43.5–53.7)
Hemoglobin: 13 g/dL — AB (ref 14.1–18.1)
LYMPH, POC: 1.8 (ref 0.6–3.4)
MCH, POC: 33.6 pg — AB (ref 27–31.2)
MCHC: 33 g/dL (ref 31.8–35.4)
MCV: 101.9 fL — AB (ref 80–97)
MPV: 6.7 fL (ref 0–99.8)
PLATELET COUNT, POC: 307 10*3/uL (ref 142–424)
POC Granulocyte: 3.4 (ref 2–6.9)
POC LYMPH PERCENT: 30.8 %L (ref 10–50)
RBC: 3.9 M/uL — AB (ref 4.69–6.13)
RDW, POC: 14 %
WBC: 5.9 10*3/uL (ref 4.6–10.2)

## 2013-12-22 NOTE — Telephone Encounter (Signed)
Aware of results. 

## 2013-12-22 NOTE — Patient Instructions (Addendum)
Medicare Annual Wellness Visit  [facility] and the medical providers at [facility] strive to bring you the best medical care.  In doing so we not only want to address your current medical conditions and concerns but also to detect new conditions early and prevent illness, disease and health-related problems.    Medicare offers a yearly Wellness Visit which allows our clinical staff to assess your need for preventative services including immunizations, lifestyle education, counseling to decrease risk of preventable diseases and screening for fall risk and other medical concerns.    This visit is provided free of charge (no copay) for all Medicare recipients. The clinical pharmacists at [facility] have begun to conduct these Wellness Visits which will also include a thorough review of all your medications.    As you primary medical provider recommend that you make an appointment for your Annual Wellness Visit if you have not done so already this year.  You may set up this appointment before you leave today or you may call back ([phone]) and schedule an appointment.  Please make sure when you call that you mention that you are scheduling your Annual Wellness Visit with the clinical pharmacist so that the appointment may be made for the proper length of time.      Continue current medications. Continue good therapeutic lifestyle changes which include good diet and exercise. Fall precautions discussed with patient. If an FOBT was given today- please return it to our front desk. If you are over 87 years old - you may need Prevnar 53 or the adult Pneumonia vaccine.  Wall regularly and drink plenty of fluids at this summer. Keep followup appointments with the pulmonologist and the neurologist.

## 2013-12-22 NOTE — Telephone Encounter (Signed)
Message copied by Shelbie Ammons on Wed Dec 22, 2013 11:47 AM ------      Message from: Chipper Herb      Created: Wed Dec 22, 2013 10:04 AM       The CBC has a normal white blood cell count. The hemoglobin is stable at 13.0 and this is actually improved from the past even though it is slightly decreased. The MCV and MCH are slightly increased. The platelet count is adequate.LAB--- please add a B12 level ------

## 2013-12-22 NOTE — Progress Notes (Signed)
Subjective:    Patient ID: Troy Rodgers., male    DOB: 1945/09/18, 68 y.o.   MRN: [id number]  HPI Pt here for follow up and management of chronic medical problems. The patient has a history of a seizure disorder and is followed by the neurologist every 6 months. He also has a history of pulmonary nodules had a CT scan recently and will followup with the pulmonologist in July. He is due to get a PSA test today. He is also due to receive an FOBT. He will have his regular lab work done also. He does not need any medication refills.       Patient Active Problem List   Diagnosis Date Noted  . Solitary pulmonary nodule RLL lateral basal segment  10/12/2013  . Rheumatoid arthritis 08/19/2013  . High risk medication use 08/19/2013  . BPH (benign prostatic hyperplasia) 08/19/2013  . Seizure disorder 05/06/2013  . Acute gangrenous cholecystitis 04/28/2013  . Abnormal EKG 04/22/2012  . Other and unspecified hyperlipidemia   . Depressive disorder, not elsewhere classified   . Localization-related (focal) (partial) epilepsy and epileptic syndromes with complex partial seizures, without mention of intractable epilepsy   . Kidney stone   . Impotence of organic origin   . External hemorrhoid   . Nasal septal perforation   . Cataract   . Hyperplasia of prostate    Outpatient Encounter Prescriptions as of 12/22/2013  Medication Sig  . aspirin 81 MG EC tablet Take 81 mg by mouth daily.    Marland Kitchen atorvastatin (LIPITOR) 40 MG tablet TAKE 1 TABLET BY MOUTH EVERY DAY AS DIRECTED  . Cholecalciferol (VITAMIN D3) 2000 UNITS TABS Take 1 tablet by mouth daily.    Marland Kitchen escitalopram (LEXAPRO) 20 MG tablet TAKE 1 TABLET DAILY  . fish oil-omega-3 fatty acids 1000 MG capsule Take 1 g by mouth 2 (two) times daily.   . folic acid (FOLVITE) 1 MG tablet Take 1 mg by mouth 2 (two) times daily.   Marland Kitchen lacosamide (VIMPAT) 200 MG TABS tablet Take 200 mg by mouth 2 (two) times daily.  Marland Kitchen levothyroxine (SYNTHROID, LEVOTHROID)  50 MCG tablet TAKE ONE TABLET BY MOUTH EVERY DAY  . methotrexate (RHEUMATREX) 5 MG tablet Take 10 mg by mouth once a week. Caution: Chemotherapy. Protect from light.  Marland Kitchen omeprazole (PRILOSEC) 20 MG capsule Take 20 mg by mouth daily.   . primidone (MYSOLINE) 250 MG tablet Take 250 mg by mouth 2 (two) times daily.   Marland Kitchen ZETIA 10 MG tablet TAKE ONE TABLET BY MOUTH DAILY FOR HYPERLIPIDERMEA    Review of Systems  Constitutional: Negative.   HENT: Negative.   Eyes: Negative.   Respiratory: Negative.   Cardiovascular: Negative.   Gastrointestinal: Negative.   Endocrine: Negative.   Genitourinary: Negative.   Musculoskeletal: Negative.   Skin: Negative.   Allergic/Immunologic: Negative.   Neurological: Negative.   Hematological: Negative.   Psychiatric/Behavioral: Negative.        Objective:   Physical Exam  Nursing note and vitals reviewed. Constitutional: He is oriented to person, place, and time. He appears well-developed and well-nourished. No distress.  HENT:  Head: Normocephalic and atraumatic.  Right Ear: External ear normal.  Left Ear: External ear normal.  Nose: Nose normal.  Mouth/Throat: Oropharynx is clear and moist. No oropharyngeal exudate.  Patient uses a hearing aid in the right ear  Eyes: Conjunctivae and EOM are normal. Pupils are equal, round, and reactive to light. Right eye exhibits no discharge. Left eye  exhibits no discharge. No scleral icterus.  Neck: Normal range of motion. Neck supple. No tracheal deviation present. No thyromegaly present.  No carotid bruits  Cardiovascular: Normal rate, regular rhythm, normal heart sounds and intact distal pulses.   No murmur heard. At 72 per minute  Pulmonary/Chest: Effort normal and breath sounds normal. No respiratory distress. He has no wheezes. He has no rales. He exhibits no tenderness.  Abdominal: Soft. Bowel sounds are normal. He exhibits no mass. There is no tenderness. There is no rebound and no guarding.    Musculoskeletal: Normal range of motion. He exhibits no edema and no tenderness.  Lymphadenopathy:    He has no cervical adenopathy.  Neurological: He is alert and oriented to person, place, and time. He has normal reflexes. No cranial nerve deficit.  Skin: Skin is warm and dry. No rash noted. No pallor.  Psychiatric: He has a normal mood and affect. His behavior is normal. Judgment and thought content normal.   BP 112/67  Pulse 61  Temp(Src) 99.1 F (37.3 C) (Oral)  Ht '5\' 6"'  (1.676 m)  Wt 152 lb (68.947 kg)  BMI 24.55 kg/m2        Assessment & Plan:  1. BPH (benign prostatic hyperplasia) - POCT CBC - PSA, total and free  2. Seizure disorder - POCT CBC  3. Rheumatoid arthritis - POCT CBC  4. Hyperlipidemia - POCT CBC - BMP8+EGFR - Hepatic function panel - NMR, lipoprofile  5. High risk medication use - POCT CBC - BMP8+EGFR - Hepatic function panel - PSA, total and free - NMR, lipoprofile - Vit D  25 hydroxy (rtn osteoporosis monitoring)  6. Vitamin D deficiency - Vit D  25 hydroxy (rtn osteoporosis monitoring)  Patient Instructions                       Medicare Annual Wellness Visit  [facility] and the medical providers at [facility] strive to bring you the best medical care.  In doing so we not only want to address your current medical conditions and concerns but also to detect new conditions early and prevent illness, disease and health-related problems.    Medicare offers a yearly Wellness Visit which allows our clinical staff to assess your need for preventative services including immunizations, lifestyle education, counseling to decrease risk of preventable diseases and screening for fall risk and other medical concerns.    This visit is provided free of charge (no copay) for all Medicare recipients. The clinical pharmacists at [facility] have begun to conduct these Wellness Visits which will also include a  thorough review of all your medications.    As you primary medical provider recommend that you make an appointment for your Annual Wellness Visit if you have not done so already this year.  You may set up this appointment before you leave today or you may call back ([phone]) and schedule an appointment.  Please make sure when you call that you mention that you are scheduling your Annual Wellness Visit with the clinical pharmacist so that the appointment may be made for the proper length of time.      Continue current medications. Continue good therapeutic lifestyle changes which include good diet and exercise. Fall precautions discussed with patient. If an FOBT was given today- please return it to our front desk. If you are over 56 years old - you may need Prevnar 74 or the adult Pneumonia vaccine.  Wall  regularly and drink plenty of fluids at this summer. Keep followup appointments with the pulmonologist and the neurologist.    Arrie Senate MD

## 2013-12-23 LAB — BMP8+EGFR
BUN / CREAT RATIO: 12 (ref 10–22)
BUN: 14 mg/dL (ref 8–27)
CO2: 30 mmol/L — ABNORMAL HIGH (ref 18–29)
Calcium: 9.7 mg/dL (ref 8.6–10.2)
Chloride: 97 mmol/L (ref 97–108)
Creatinine, Ser: 1.18 mg/dL (ref 0.76–1.27)
GFR calc Af Amer: 73 mL/min/{1.73_m2} (ref >59)
GFR calc non Af Amer: 63 mL/min/{1.73_m2} (ref >59)
GLUCOSE: 90 mg/dL (ref 65–99)
POTASSIUM: 5.2 mmol/L (ref 3.5–5.2)
Sodium: 140 mmol/L (ref 134–144)

## 2013-12-23 LAB — HEPATIC FUNCTION PANEL
ALK PHOS: 100 IU/L (ref 39–117)
ALT: 34 IU/L (ref 0–44)
AST: 39 IU/L (ref 0–40)
Albumin: 4.4 g/dL (ref 3.6–4.8)
BILIRUBIN DIRECT: 0.08 mg/dL (ref 0.00–0.40)
Total Bilirubin: <0.2 mg/dL (ref 0.0–1.2)
Total Protein: 7.2 g/dL (ref 6.0–8.5)

## 2013-12-23 LAB — NMR, LIPOPROFILE
CHOLESTEROL: 150 mg/dL (ref 100–199)
HDL Cholesterol by NMR: 58 mg/dL (ref >39)
HDL PARTICLE NUMBER: 30.3 umol/L — AB (ref >=30.5)
LDL Particle Number: 979 nmol/L (ref <1000)
LDL SIZE: 20.9 nm (ref >20.5)
LDLC SERPL CALC-MCNC: 73 mg/dL (ref 0–99)
LP-IR Score: 31 (ref <=45)
Small LDL Particle Number: 498 nmol/L (ref <=527)
Triglycerides by NMR: 97 mg/dL (ref 0–149)

## 2013-12-23 LAB — PSA, TOTAL AND FREE
PSA, Free Pct: 65 %
PSA, Free: 0.13 ng/mL
PSA: 0.2 ng/mL (ref 0.0–4.0)

## 2013-12-23 LAB — VITAMIN D 25 HYDROXY (VIT D DEFICIENCY, FRACTURES): Vit D, 25-Hydroxy: 53.8 ng/mL (ref 30.0–100.0)

## 2014-01-03 ENCOUNTER — Other Ambulatory Visit (INDEPENDENT_AMBULATORY_CARE_PROVIDER_SITE_OTHER): Payer: Medicare Other

## 2014-01-03 DIAGNOSIS — Z1212 Encounter for screening for malignant neoplasm of rectum: Secondary | ICD-10-CM

## 2014-01-04 LAB — FECAL OCCULT BLOOD, IMMUNOCHEMICAL: Fecal Occult Bld: NEGATIVE

## 2014-01-10 ENCOUNTER — Ambulatory Visit (INDEPENDENT_AMBULATORY_CARE_PROVIDER_SITE_OTHER): Payer: Medicare Other | Admitting: Internal Medicine

## 2014-01-10 ENCOUNTER — Encounter: Payer: Self-pay | Admitting: Internal Medicine

## 2014-01-10 ENCOUNTER — Ambulatory Visit (INDEPENDENT_AMBULATORY_CARE_PROVIDER_SITE_OTHER)
Admission: RE | Admit: 2014-01-10 | Discharge: 2014-01-10 | Disposition: A | Discharge status: Home / Self Care | Payer: Medicare Other | Source: Ambulatory Visit | Attending: Internal Medicine | Admitting: Internal Medicine

## 2014-01-10 VITALS — BP 110/60 | HR 62 | Temp 99.2°F | Ht 66.0 in | Wt 154.0 lb

## 2014-01-10 DIAGNOSIS — R911 Solitary pulmonary nodule: Secondary | ICD-10-CM

## 2014-01-10 NOTE — Assessment & Plan Note (Signed)
-   never smoker - first seen 05/2012 plain film but not well on f/u cxr 04/30/14 as the RLL is atelectatic on that view - CT chest 09/08/13 no change on sagittal cuts vs 06/04/12 lateral view > no lesions on cxr 01/10/2014   Discussed in detail all the  indications, usual  risks and alternatives  relative to the benefits with patient who agrees to proceed with > rec f/u CT chest 07/12/14 as he is very low but not "no risk" and main ddx is  RA nodule seen on previous cts assoc with hand nodules so not a good candidate for PET.

## 2014-01-10 NOTE — Patient Instructions (Addendum)
Please schedule a follow up visit in 6 months but call sooner if needed with cxr on return 

## 2014-01-10 NOTE — Progress Notes (Signed)
Quick Note:  Spoke with pt and notified of results per Dr. Wert. Pt verbalized understanding and denied any questions.  ______ 

## 2014-01-10 NOTE — Progress Notes (Signed)
Subjective:    Patient ID: Troy Stock., male    DOB: [birth year]  MRN: [id number]  Troy Rodgers  Brief patient profile:  40 yowm never smoker with RA on MTX and acute RUQ pain mid oct 2014 c/w GB dz but    CT abd also  spn > CT f/u 08/23/13 same nodule, no change, referred 10/12/2013 by Dr [name] to pulmonary clinic.  History of Present Illness  10/12/2013 1st [facility] office visit/ Wert cc cp/abd completely resolved with lap chole 04/27/14 . Not limited by breathing from desired activities   rec F/u in 3 m   01/10/2014 f/u ov/Wert re: spn RLL in setting of RA with RA nodules  Chief Complaint  Patient presents with  . Follow-up    Pt her to f/u on pulmonary nodule. He denies any co's today.      No flare of RA symptoms on present rx. No cough  No obvious day to day or daytime variabilty or assoc sob or cp or chest tightness, subjective wheeze overt sinus or hb symptoms. No unusual exp hx or h/o childhood pna/ asthma or knowledge of premature birth.  Sleeping ok without nocturnal  or early am exacerbation  of respiratory  c/o's or need for noct saba. Also denies any obvious fluctuation of symptoms with weather or environmental changes or other aggravating or alleviating factors except as outlined above   Current Medications, Allergies, Complete Past Medical History, Past Surgical History, Family History, and Social History were reviewed in [employer] record.  ROS  The following are not active complaints unless bolded sore throat, dysphagia, dental problems, itching, sneezing,  nasal congestion or excess/ purulent secretions, ear ache,   fever, chills, sweats, unintended wt loss, pleuritic or exertional cp, hemoptysis,  orthopnea pnd or leg swelling, presyncope, palpitations, heartburn, abdominal pain, anorexia, nausea, vomiting, diarrhea  or change in bowel or urinary habits, change in stools or urine, dysuria,hematuria,  rash, arthralgias better , visual  complaints, headache, numbness weakness or ataxia or problems with walking or coordination,  change in mood/affect or memory.                          Objective:   Physical Exam  amb wm nad   01/10/2014       154 Wt Readings from Last 3 Encounters:  10/12/13 152 lb (68.947 kg)  08/19/13 151 lb (68.493 kg)  05/25/13 149 lb 12.8 oz (67.949 kg)     HEENT: nl dentition, turbinates, and orophanx. Nl external ear canals without cough reflex   NECK :  without JVD/Nodes/TM/ nl carotid upstrokes bilaterally   LUNGS: no acc muscle use, clear to A and P bilaterally without cough on insp or exp maneuvers   CV:  RRR  no s3 or murmur or increase in P2, no edema   ABD:  soft and nontender with nl excursion in the supine position. No bruits or organomegaly, bowel sounds nl  MS:  warm without deformities, calf tenderness, cyanosis or clubbing- no obvious RA nodules   SKIN: warm and dry with nodules R palm       CXR  01/10/2014 :    The previously noted nodular lesion in the right lower lobe seen on CT is not well seen on this radiographic examination. Given this circumstance, followup of this nodular lesion would best be performed by repeat CT. A lesion of this nature should be followed serially for a  minimum of 2 years without change to indicate benign etiology.         Assessment & Plan:

## 2014-01-11 ENCOUNTER — Encounter: Payer: Self-pay | Admitting: Physician Assistant

## 2014-01-11 ENCOUNTER — Ambulatory Visit (INDEPENDENT_AMBULATORY_CARE_PROVIDER_SITE_OTHER): Payer: Medicare Other | Admitting: Physician Assistant

## 2014-01-11 VITALS — BP 140/74 | HR 87 | Temp 99.6°F | Ht 66.0 in | Wt 154.2 lb

## 2014-01-11 DIAGNOSIS — L0231 Cutaneous abscess of buttock: Secondary | ICD-10-CM

## 2014-01-11 DIAGNOSIS — R7989 Other specified abnormal findings of blood chemistry: Secondary | ICD-10-CM

## 2014-01-11 DIAGNOSIS — L03317 Cellulitis of buttock: Principal | ICD-10-CM

## 2014-01-11 NOTE — Patient Instructions (Signed)
Cellulitis Cellulitis is an infection of the skin and the tissue beneath it. The infected area is usually red and tender. Cellulitis occurs most often in the arms and lower legs.  CAUSES  Cellulitis is caused by bacteria that enter the skin through cracks or cuts in the skin. The most common types of bacteria that cause cellulitis are Staphylococcus and Streptococcus. SYMPTOMS   Redness and warmth.  Swelling.  Tenderness or pain.  Fever. DIAGNOSIS  Your caregiver can usually determine what is wrong based on a physical exam. Blood tests may also be done. TREATMENT  Treatment usually involves taking an antibiotic medicine. HOME CARE INSTRUCTIONS   Take your antibiotics as directed. Finish them even if you start to feel better.  Keep the infected arm or leg elevated to reduce swelling.  Apply a warm cloth to the affected area up to 4 times per day to relieve pain.  Only take over-the-counter or prescription medicines for pain, discomfort, or fever as directed by your caregiver.  Keep all follow-up appointments as directed by your caregiver. SEEK MEDICAL CARE IF:   You notice red streaks coming from the infected area.  Your red area gets larger or turns dark in color.  Your bone or joint underneath the infected area becomes painful after the skin has healed.  Your infection returns in the same area or another area.  You notice a swollen bump in the infected area.  You develop new symptoms. SEEK IMMEDIATE MEDICAL CARE IF:   You have a fever.  You feel very sleepy.  You develop vomiting or diarrhea.  You have a general ill feeling (malaise) with muscle aches and pains. MAKE SURE YOU:   Understand these instructions.  Will watch your condition.  Will get help right away if you are not doing well or get worse. Document Released: 04/10/2005 Document Revised: 12/31/2011 Document Reviewed: 09/16/2011 ExitCare Patient Information 2015 [employer]. This information is  not intended to replace advice given to you by your health care provider. Make sure you discuss any questions you have with your health care provider.  

## 2014-01-11 NOTE — Progress Notes (Signed)
Subjective:     Patient ID: Troy Rodgers., male   DOB: [birth year], 68 y.o.   MRN: [id number]  HPI Pt with a knot to the R groin area It started off painful but sx have improved No known drainage No OTC meds used  Review of Systems + pain but has improved No drainage No hx of same    Objective:   Physical Exam + erythem lesion to the r ing area between the scrotum/anus Minimal surrounding erythema/induration No ulceration Appears healing    Assessment:     Cellulitis/cyst    Plan:     Continue with warm compresses No manipulation of the area Keflex 500mg  bid x 10 days F/U prn

## 2014-01-12 ENCOUNTER — Telehealth: Payer: Self-pay | Admitting: Physician Assistant

## 2014-01-12 LAB — VITAMIN B12: Vitamin B-12: 259 pg/mL (ref 211–946)

## 2014-01-12 MED ORDER — CEPHALEXIN 500 MG PO CAPS: 500.0000 mg | ORAL_CAPSULE | Freq: Two times a day (BID) | ORAL | Status: DC

## 2014-01-12 NOTE — Telephone Encounter (Signed)
Keflex not sent over its not on med list please resend

## 2014-01-12 NOTE — Telephone Encounter (Signed)
Pls inform this was done

## 2014-01-12 NOTE — Telephone Encounter (Signed)
Keflex done yest Please have him recheck

## 2014-01-12 NOTE — Telephone Encounter (Signed)
Patient aware.

## 2014-01-23 IMAGING — CR DG CHEST 2V
2 series · 2 of 2 positions shown · non-contrast
Comparison: 04/27/2013.

CLINICAL DATA: Small bowel obstruction. Decreased oxygen
saturation.

EXAM:
CHEST  2 VIEW

[w chest pa]
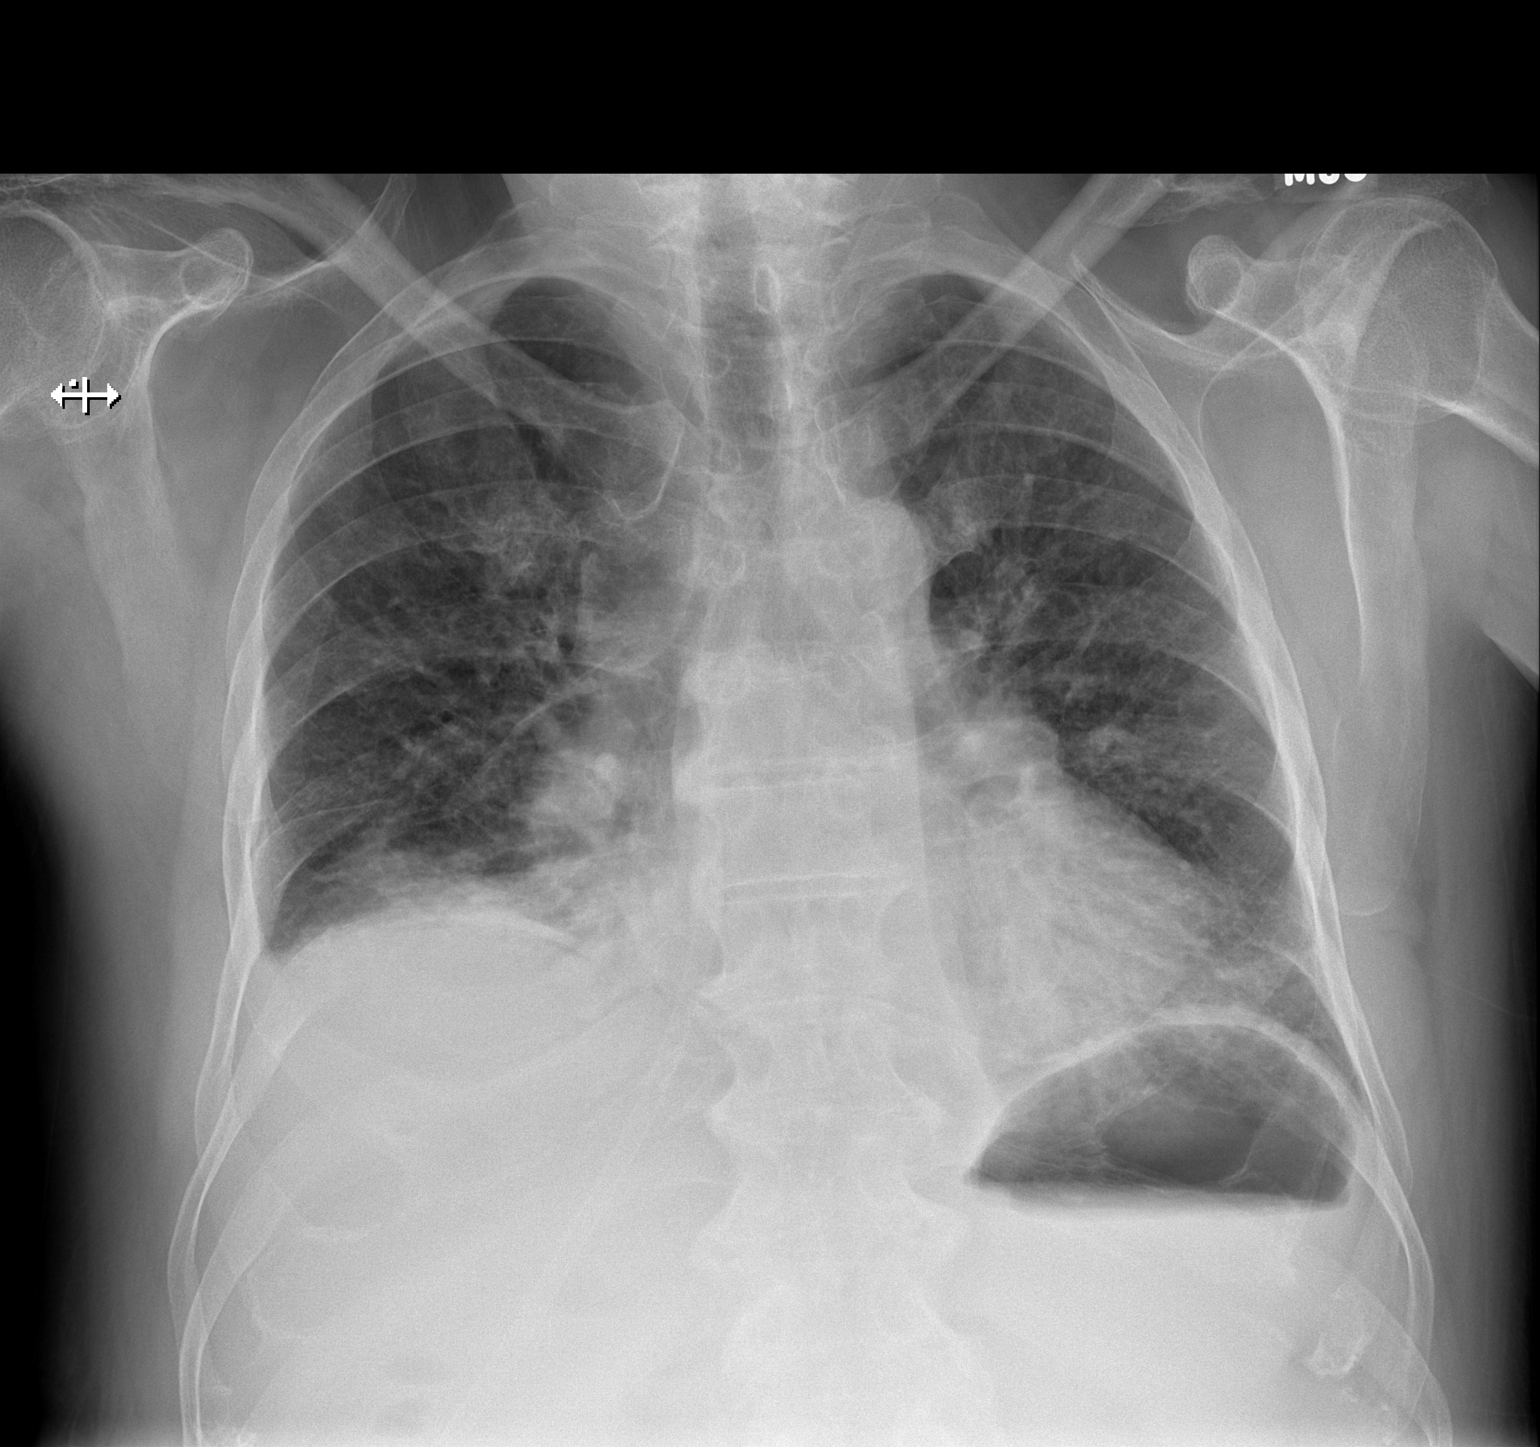

[w chest lat]
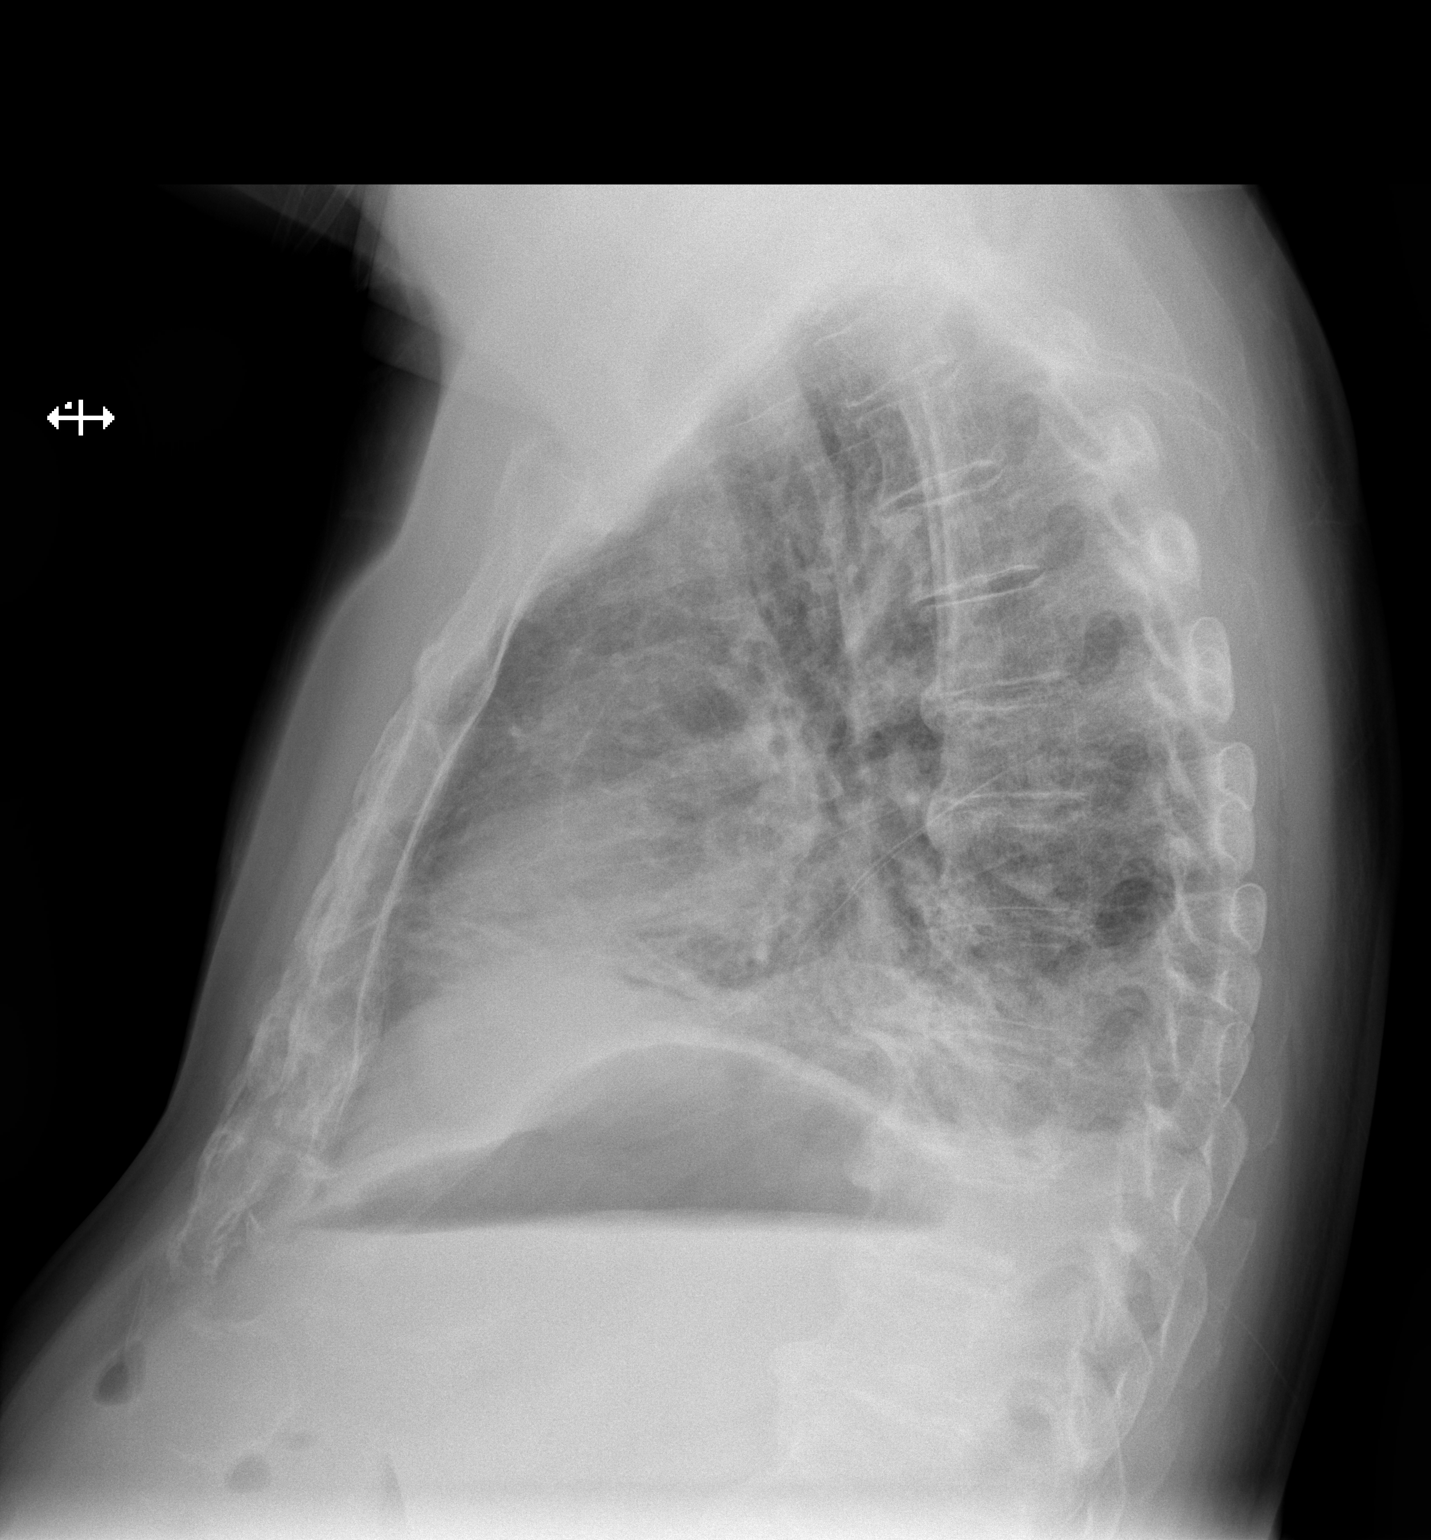

[2 of 2 positions shown; findings below may reference images not displayed]

FINDINGS: Cardiomegaly. Increasing volume loss at the right lung base, without
definite consolidation. Small right effusion. Left lung demonstrates
mild vascular congestion but no significant atelectasis or
consolidation. Cardiac enlargement. Osseous demineralization.
Questioned area of left upper lobe nodularity not clearly seen on
today's exam.
IMPRESSION: Slight worsening aeration. Right base atelectasis without definite
pneumonia. Small right effusion.

## 2014-02-11 ENCOUNTER — Telehealth: Payer: Self-pay | Admitting: Family Medicine

## 2014-02-11 NOTE — Telephone Encounter (Signed)
Explained that allowing the area to drain is actually beneficial. He should keep the area clean, dry, and covered.  Appt scheduled for 8/3 with Dr. [name].  Patient stated understanding and agreement to plan.

## 2014-02-14 ENCOUNTER — Ambulatory Visit (INDEPENDENT_AMBULATORY_CARE_PROVIDER_SITE_OTHER): Payer: Medicare Other | Admitting: Family Medicine

## 2014-02-14 ENCOUNTER — Encounter: Payer: Self-pay | Admitting: Family Medicine

## 2014-02-14 VITALS — BP 116/63 | HR 71 | Temp 97.9°F | Ht 66.0 in | Wt 154.0 lb

## 2014-02-14 DIAGNOSIS — L988 Other specified disorders of the skin and subcutaneous tissue: Secondary | ICD-10-CM

## 2014-02-14 DIAGNOSIS — L0591 Pilonidal cyst without abscess: Secondary | ICD-10-CM

## 2014-02-14 DIAGNOSIS — L089 Local infection of the skin and subcutaneous tissue, unspecified: Secondary | ICD-10-CM

## 2014-02-14 NOTE — Progress Notes (Signed)
Subjective:    Patient ID: Troy Genre., male    DOB: 08-Aug-1945, 68 y.o.   MRN: [id number]  HPI Patient here today for follow up of abscess near the right groin area. This was addressed at a visit 01/12/14 with Thomasene Mohair- when he received a round of Keflex.       Patient Active Problem List   Diagnosis Date Noted  . Solitary pulmonary nodule RLL lateral basal segment  10/12/2013  . Rheumatoid arthritis 08/19/2013  . High risk medication use 08/19/2013  . BPH (benign prostatic hyperplasia) 08/19/2013  . Seizure disorder 05/06/2013  . Acute gangrenous cholecystitis 04/28/2013  . Abnormal EKG 04/22/2012  . Other and unspecified hyperlipidemia   . Depressive disorder, not elsewhere classified   . Localization-related (focal) (partial) epilepsy and epileptic syndromes with complex partial seizures, without mention of intractable epilepsy   . Kidney stone   . Impotence of organic origin   . External hemorrhoid   . Nasal septal perforation   . Cataract   . Hyperplasia of prostate    Outpatient Encounter Prescriptions as of 02/14/2014  Medication Sig  . aspirin 81 MG EC tablet Take 81 mg by mouth daily.    Marland Kitchen atorvastatin (LIPITOR) 40 MG tablet TAKE 1 TABLET BY MOUTH EVERY DAY AS DIRECTED  . Cholecalciferol (VITAMIN D3) 2000 UNITS TABS Take 1 tablet by mouth daily.    Marland Kitchen escitalopram (LEXAPRO) 20 MG tablet TAKE 1 TABLET DAILY  . ezetimibe (ZETIA) 10 MG tablet 1/2 tablet oral daily  . fish oil-omega-3 fatty acids 1000 MG capsule Take 1 g by mouth 2 (two) times daily.   . folic acid (FOLVITE) 1 MG tablet Take 1 mg by mouth 2 (two) times daily.   Marland Kitchen lacosamide (VIMPAT) 200 MG TABS tablet Take 200 mg by mouth 2 (two) times daily.  Marland Kitchen levothyroxine (SYNTHROID, LEVOTHROID) 50 MCG tablet TAKE ONE TABLET BY MOUTH EVERY DAY  . methotrexate (RHEUMATREX) 5 MG tablet Take 10 mg by mouth once a week. Caution: Chemotherapy. Protect from light.  Marland Kitchen omeprazole (PRILOSEC) 20 MG capsule Take 20  mg by mouth daily.   . primidone (MYSOLINE) 250 MG tablet Take 250 mg by mouth 2 (two) times daily.   . [DISCONTINUED] cephALEXin (KEFLEX) 500 MG capsule Take 1 capsule (500 mg total) by mouth 2 (two) times daily.    Review of Systems  Constitutional: Negative.   HENT: Negative.   Eyes: Negative.   Respiratory: Negative.   Cardiovascular: Negative.   Gastrointestinal: Negative.   Endocrine: Negative.   Genitourinary: Negative.   Musculoskeletal: Negative.   Skin: Negative.        Skin abscess- right groin area.  Allergic/Immunologic: Negative.   Neurological: Negative.   Hematological: Negative.   Psychiatric/Behavioral: Negative.        Objective:   Physical Exam  Nursing note and vitals reviewed. Constitutional: He is oriented to person, place, and time. He appears well-developed and well-nourished. No distress.  Abdominal: Soft. Bowel sounds are normal. He exhibits no distension and no mass. There is no tenderness. There is no rebound and no guarding.  Genitourinary: Penis normal.  Musculoskeletal: Normal range of motion.  Neurological: He is alert and oriented to person, place, and time.  Skin: Skin is warm and dry. No rash noted.  In the right medial groin, there is a tiny opening with no surrounding erythema or drainage. Pressure was applied and no drainage was expressed. There is no sign of any lymphadenopathy. There  is no tenderness or fullness where the tiny opening is located.   Psychiatric: He has a normal mood and affect. His behavior is normal. Thought content normal.   BP 116/63  Pulse 71  Temp(Src) 97.9 F (36.6 C) (Oral)  Ht 5\' 6"  (1.676 m)  Wt 154 lb (69.854 kg)  BMI 24.87 kg/m2        Assessment & Plan:   1. Fistula  2. Pilonidal cyst without infection  Patient Instructions  Continue to drink plenty of fluids and try to avoid constipation and straining If this area continues to give you problems i.e. drainage pain or swelling, please return to  the office and we may need to get you to see a gastroenterologist to further evaluate the source of this drainage and opening   Arrie Senate MD

## 2014-02-14 NOTE — Patient Instructions (Signed)
Continue to drink plenty of fluids and try to avoid constipation and straining If this area continues to give you problems i.e. drainage pain or swelling, please return to the office and we may need to get you to see a gastroenterologist to further evaluate the source of this drainage and opening

## 2014-02-27 ENCOUNTER — Other Ambulatory Visit: Payer: Self-pay | Admitting: Family Medicine

## 2014-03-06 ENCOUNTER — Other Ambulatory Visit: Payer: Self-pay | Admitting: Family Medicine

## 2014-03-09 NOTE — Telephone Encounter (Signed)
Make sure that this patient gets a thyroid profile. He can come in to get this any time without fasting. It is okay to refill both of these medications.

## 2014-03-09 NOTE — Telephone Encounter (Signed)
Last TSH 08/2011

## 2014-03-09 NOTE — Telephone Encounter (Signed)
Pt aware.

## 2014-03-27 ENCOUNTER — Other Ambulatory Visit: Payer: Self-pay | Admitting: Family Medicine

## 2014-04-29 ENCOUNTER — Other Ambulatory Visit: Payer: Self-pay

## 2014-05-04 ENCOUNTER — Ambulatory Visit (INDEPENDENT_AMBULATORY_CARE_PROVIDER_SITE_OTHER): Payer: Medicare Other | Admitting: Family Medicine

## 2014-05-04 ENCOUNTER — Encounter: Payer: Self-pay | Admitting: Family Medicine

## 2014-05-04 VITALS — BP 126/74 | HR 73 | Temp 96.9°F | Ht 66.0 in | Wt 154.0 lb

## 2014-05-04 DIAGNOSIS — Z79899 Other long term (current) drug therapy: Secondary | ICD-10-CM

## 2014-05-04 DIAGNOSIS — E785 Hyperlipidemia, unspecified: Secondary | ICD-10-CM

## 2014-05-04 DIAGNOSIS — G40909 Epilepsy, unspecified, not intractable, without status epilepticus: Secondary | ICD-10-CM

## 2014-05-04 DIAGNOSIS — N400 Enlarged prostate without lower urinary tract symptoms: Secondary | ICD-10-CM

## 2014-05-04 DIAGNOSIS — E039 Hypothyroidism, unspecified: Secondary | ICD-10-CM | POA: Insufficient documentation

## 2014-05-04 DIAGNOSIS — E559 Vitamin D deficiency, unspecified: Secondary | ICD-10-CM

## 2014-05-04 DIAGNOSIS — Z23 Encounter for immunization: Secondary | ICD-10-CM

## 2014-05-04 LAB — POCT CBC
GRANULOCYTE PERCENT: 61.7 % (ref 37–80)
HCT, POC: 39.7 % — AB (ref 43.5–53.7)
Hemoglobin: 12.8 g/dL — AB (ref 14.1–18.1)
Lymph, poc: 2 (ref 0.6–3.4)
MCH: 32.4 pg — AB (ref 27–31.2)
MCHC: 32.3 g/dL (ref 31.8–35.4)
MCV: 100.3 fL — AB (ref 80–97)
MPV: 7.2 fL (ref 0–99.8)
PLATELET COUNT, POC: 304 10*3/uL (ref 142–424)
POC Granulocyte: 3.8 (ref 2–6.9)
POC LYMPH %: 33.3 % (ref 10–50)
RBC: 4 M/uL — AB (ref 4.69–6.13)
RDW, POC: 15.6 %
WBC: 6.1 10*3/uL (ref 4.6–10.2)

## 2014-05-04 NOTE — Progress Notes (Signed)
Subjective:    Patient ID: Troy Genre., male    DOB: 06/12/1946, 68 y.o.   MRN: [id number]  HPI Pt here for follow up and management of chronic medical problems. The patient has a history of multiple medical problems. Significant are his seizure disorder, hyperlipidemia, and rheumatoid arthritis. He has a history also of a pulmonary nodule which is being followed. He also has a history of depression. He has no specific complaints today. He'll be getting lab work and a flu shot today. He has a colonoscopy planned for next Tuesday. Can has bilateral hearing aids and has some trouble hearing.        Patient Active Problem List   Diagnosis Date Noted  . Hypothyroidism 05/04/2014  . Solitary pulmonary nodule RLL lateral basal segment  10/12/2013  . Rheumatoid arthritis 08/19/2013  . High risk medication use 08/19/2013  . BPH (benign prostatic hyperplasia) 08/19/2013  . Seizure disorder 05/06/2013  . Acute gangrenous cholecystitis 04/28/2013  . Abnormal EKG 04/22/2012  . Hyperlipidemia   . Depressive disorder, not elsewhere classified   . Localization-related (focal) (partial) epilepsy and epileptic syndromes with complex partial seizures, without mention of intractable epilepsy   . Kidney stone   . Impotence of organic origin   . External hemorrhoid   . Nasal septal perforation   . Cataract   . Hyperplasia of prostate    Outpatient Encounter Prescriptions as of 05/04/2014  Medication Sig  . aspirin 81 MG EC tablet Take 81 mg by mouth daily.    Marland Kitchen atorvastatin (LIPITOR) 40 MG tablet TAKE 1 TABLET BY MOUTH EVERY DAY AS DIRECTED  . Cholecalciferol (VITAMIN D3) 2000 UNITS TABS Take 1 tablet by mouth daily.    Marland Kitchen escitalopram (LEXAPRO) 20 MG tablet TAKE 1 TABLET DAILY  . ezetimibe (ZETIA) 10 MG tablet 1/2 tablet oral daily  . fish oil-omega-3 fatty acids 1000 MG capsule Take 1 g by mouth 2 (two) times daily.   . folic acid (FOLVITE) 1 MG tablet Take 1 mg by mouth 2 (two) times  daily.   Marland Kitchen lacosamide (VIMPAT) 200 MG TABS tablet Take 200 mg by mouth 2 (two) times daily.  Marland Kitchen levothyroxine (SYNTHROID, LEVOTHROID) 50 MCG tablet TAKE ONE TABLET BY MOUTH EVERY DAY  . methotrexate (RHEUMATREX) 5 MG tablet Take 10 mg by mouth once a week. Caution: Chemotherapy. Protect from light.  Marland Kitchen omeprazole (PRILOSEC) 20 MG capsule Take 20 mg by mouth daily.   . primidone (MYSOLINE) 250 MG tablet Take 250 mg by mouth 2 (two) times daily.   Marland Kitchen ZETIA 10 MG tablet TAKE ONE TABLET BY MOUTH DAILY FOR HYPERLIPIDERMEA    Review of Systems  Constitutional: Negative.   HENT: Negative.   Eyes: Negative.   Respiratory: Negative.   Cardiovascular: Negative.   Gastrointestinal: Negative.   Endocrine: Negative.   Genitourinary: Negative.   Musculoskeletal: Negative.   Skin: Negative.   Allergic/Immunologic: Negative.   Neurological: Negative.   Hematological: Negative.   Psychiatric/Behavioral: Negative.        Objective:   Physical Exam  Nursing note and vitals reviewed. Constitutional: He is oriented to person, place, and time. He appears well-developed and well-nourished. No distress.  The patient is pleasant alert and doing well.  HENT:  Head: Normocephalic and atraumatic.  Right Ear: External ear normal.  Left Ear: External ear normal.  Nose: Nose normal.  Mouth/Throat: Oropharynx is clear and moist. No oropharyngeal exudate.  He has bilateral hearing aids.  Eyes: Conjunctivae and  EOM are normal. Pupils are equal, round, and reactive to light. Right eye exhibits no discharge. Left eye exhibits no discharge. No scleral icterus.  Neck: Normal range of motion. Neck supple. No thyromegaly present.  No carotid bruit  Cardiovascular: Normal rate, regular rhythm, normal heart sounds and intact distal pulses.  Exam reveals no gallop and no friction rub.   No murmur heard. Regular rate and rhythm at 60 per minute  Pulmonary/Chest: Effort normal and breath sounds normal. No respiratory  distress. He has no wheezes. He has no rales. He exhibits no tenderness.  Abdominal: Soft. Bowel sounds are normal. He exhibits no mass. There is no tenderness. There is no rebound and no guarding.  Genitourinary: Rectum normal.  The external rectal area appears normal with no sign of any drainage or inflammation. There is a small external hemorrhoid that is not inflamed.  Musculoskeletal: Normal range of motion. He exhibits no edema.  Lymphadenopathy:    He has no cervical adenopathy.  Neurological: He is alert and oriented to person, place, and time. He has normal reflexes. No cranial nerve deficit.  Skin: Skin is warm and dry. No rash noted. No erythema. No pallor.  Psychiatric: He has a normal mood and affect. His behavior is normal. Judgment and thought content normal.   BP 126/74  Pulse 73  Temp(Src) 96.9 F (36.1 C) (Oral)  Ht '5\' 6"'  (1.676 m)  Wt 154 lb (69.854 kg)  BMI 24.87 kg/m2        Assessment & Plan:  1. BPH (benign prostatic hyperplasia) - POCT CBC  2. High risk medication use - POCT CBC  3. Seizure disorder - POCT CBC  4. Hyperlipidemia - POCT CBC - BMP8+EGFR - Hepatic function panel - NMR, lipoprofile  5. Hypothyroidism, unspecified hypothyroidism type - POCT CBC - Thyroid Panel With TSH  6. Vitamin D deficiency - Vit D  25 hydroxy (rtn osteoporosis monitoring)  Patient Instructions                       Medicare Annual Wellness Visit  [facility] and the medical providers at [facility] strive to bring you the best medical care.  In doing so we not only want to address your current medical conditions and concerns but also to detect new conditions early and prevent illness, disease and health-related problems.    Medicare offers a yearly Wellness Visit which allows our clinical staff to assess your need for preventative services including immunizations, lifestyle education, counseling to decrease risk of preventable diseases  and screening for fall risk and other medical concerns.    This visit is provided free of charge (no copay) for all Medicare recipients. The clinical pharmacists at [facility] have begun to conduct these Wellness Visits which will also include a thorough review of all your medications.    As you primary medical provider recommend that you make an appointment for your Annual Wellness Visit if you have not done so already this year.  You may set up this appointment before you leave today or you may call back ([phone]) and schedule an appointment.  Please make sure when you call that you mention that you are scheduling your Annual Wellness Visit with the clinical pharmacist so that the appointment may be made for the proper length of time.     Continue current medications. Continue good therapeutic lifestyle changes which include good diet and exercise. Fall precautions discussed with patient. If  an FOBT was given today- please return it to our front desk. If you are over 52 years old - you may need Prevnar 9 or the adult Pneumonia vaccine.  Flu Shots will be available at our office starting mid- September. Please call and schedule a FLU CLINIC APPOINTMENT.   Please follow through with a colonoscopy on Tuesday Continue to take medicines as doing Continue to drink plenty of fluids    Arrie Senate MD

## 2014-05-04 NOTE — Patient Instructions (Addendum)
Medicare Annual Wellness Visit  [facility] and the medical providers at [facility] strive to bring you the best medical care.  In doing so we not only want to address your current medical conditions and concerns but also to detect new conditions early and prevent illness, disease and health-related problems.    Medicare offers a yearly Wellness Visit which allows our clinical staff to assess your need for preventative services including immunizations, lifestyle education, counseling to decrease risk of preventable diseases and screening for fall risk and other medical concerns.    This visit is provided free of charge (no copay) for all Medicare recipients. The clinical pharmacists at [facility] have begun to conduct these Wellness Visits which will also include a thorough review of all your medications.    As you primary medical provider recommend that you make an appointment for your Annual Wellness Visit if you have not done so already this year.  You may set up this appointment before you leave today or you may call back ([phone]) and schedule an appointment.  Please make sure when you call that you mention that you are scheduling your Annual Wellness Visit with the clinical pharmacist so that the appointment may be made for the proper length of time.     Continue current medications. Continue good therapeutic lifestyle changes which include good diet and exercise. Fall precautions discussed with patient. If an FOBT was given today- please return it to our front desk. If you are over 11 years old - you may need Prevnar 26 or the adult Pneumonia vaccine.  Flu Shots will be available at our office starting mid- September. Please call and schedule a FLU CLINIC APPOINTMENT.   Please follow through with a colonoscopy on Tuesday Continue to take medicines as doing Continue to drink plenty of fluids

## 2014-05-05 ENCOUNTER — Telehealth: Payer: Self-pay | Admitting: *Deleted

## 2014-05-05 LAB — HEPATIC FUNCTION PANEL
ALBUMIN: 4.2 g/dL (ref 3.6–4.8)
ALK PHOS: 95 IU/L (ref 39–117)
ALT: 32 IU/L (ref 0–44)
AST: 39 IU/L (ref 0–40)
BILIRUBIN TOTAL: 0.2 mg/dL (ref 0.0–1.2)
Bilirubin, Direct: 0.06 mg/dL (ref 0.00–0.40)
Total Protein: 7.2 g/dL (ref 6.0–8.5)

## 2014-05-05 LAB — BMP8+EGFR
BUN/Creatinine Ratio: 13 (ref 10–22)
BUN: 14 mg/dL (ref 8–27)
CALCIUM: 9.1 mg/dL (ref 8.6–10.2)
CHLORIDE: 97 mmol/L (ref 97–108)
CO2: 28 mmol/L (ref 18–29)
CREATININE: 1.08 mg/dL (ref 0.76–1.27)
GFR calc Af Amer: 81 mL/min/{1.73_m2} (ref >59)
GFR calc non Af Amer: 70 mL/min/{1.73_m2} (ref >59)
Glucose: 92 mg/dL (ref 65–99)
Potassium: 4.3 mmol/L (ref 3.5–5.2)
SODIUM: 139 mmol/L (ref 134–144)

## 2014-05-05 LAB — NMR, LIPOPROFILE
CHOLESTEROL: 151 mg/dL (ref 100–199)
HDL CHOLESTEROL BY NMR: 54 mg/dL (ref >39)
HDL PARTICLE NUMBER: 28.8 umol/L — AB (ref >=30.5)
LDL Particle Number: 820 nmol/L (ref <1000)
LDL Size: 21.3 nm (ref >20.5)
LDLC SERPL CALC-MCNC: 73 mg/dL (ref 0–99)
LP-IR Score: <25 (ref <=45)
Small LDL Particle Number: 265 nmol/L (ref <=527)
Triglycerides by NMR: 121 mg/dL (ref 0–149)

## 2014-05-05 LAB — VITAMIN D 25 HYDROXY (VIT D DEFICIENCY, FRACTURES): VIT D 25 HYDROXY: 50.6 ng/mL (ref 30.0–100.0)

## 2014-05-05 LAB — THYROID PANEL WITH TSH
Free Thyroxine Index: 1.8 (ref 1.2–4.9)
T3 Uptake Ratio: 27 % (ref 24–39)
T4, Total: 6.5 ug/dL (ref 4.5–12.0)
TSH: 3.37 u[IU]/mL (ref 0.450–4.500)

## 2014-05-05 NOTE — Telephone Encounter (Signed)
Message copied by Shelbie Ammons on Thu May 05, 2014 10:13 AM ------      Message from: Chipper Herb      Created: Thu May 05, 2014  7:13 AM       The blood sugar, creatinine, and electrolytes including potassium are all within normal limits      All liver function tests are within normal      Cholesterol numbers with an advanced lipid panel are at goal with an LDL particle number at 820. The LDL C. is good at 73. The triglycerides are also within normal limits at 121. The good cholesterol, or the HDL particle number of remains low as it has been in the past. The patient should continue current treatment and aggressive therapeutic lifestyle changes      All thyroid function tests are within normal limits, continue current treatment      The vitamin D level is also good, continue current treatment ------

## 2014-05-05 NOTE — Telephone Encounter (Signed)
Aware. 

## 2014-06-05 ENCOUNTER — Other Ambulatory Visit: Payer: Self-pay | Admitting: Family Medicine

## 2014-07-04 ENCOUNTER — Encounter: Payer: Self-pay | Admitting: Internal Medicine

## 2014-07-04 ENCOUNTER — Ambulatory Visit (INDEPENDENT_AMBULATORY_CARE_PROVIDER_SITE_OTHER): Payer: Medicare Other | Admitting: Internal Medicine

## 2014-07-04 ENCOUNTER — Ambulatory Visit (INDEPENDENT_AMBULATORY_CARE_PROVIDER_SITE_OTHER)
Admission: RE | Admit: 2014-07-04 | Discharge: 2014-07-04 | Disposition: A | Discharge status: Home / Self Care | Payer: Medicare Other | Source: Ambulatory Visit | Attending: Internal Medicine | Admitting: Internal Medicine

## 2014-07-04 DIAGNOSIS — R911 Solitary pulmonary nodule: Secondary | ICD-10-CM

## 2014-07-04 NOTE — Patient Instructions (Signed)
Since the original study showed the nodule 2 years ago and no evidence that it has grown since then , further studies are optional  Call if develop  unexplained cough, shortness of breath or pain with breathing  Otherwise follow up is as needed

## 2014-07-04 NOTE — Progress Notes (Signed)
Subjective:    Patient ID: Troy Rodgers., male    DOB: 27-Nov-1945  MRN: [id number]  Troy Rodgers  Brief patient profile:  46 yowm never smoker with RA on MTX and acute RUQ pain mid oct 2014 c/w GB dz but    CT abd also showed  spn > CT f/u 08/23/13 same nodule, no change, referred 10/12/2013 by Dr [name] to pulmonary clinic for serial f/u for clinical dx of benign ? RA related nodule  History of Present Illness  10/12/2013 1st [facility] office visit/ Troy Rodgers cc cp/abd pain  completely resolved with lap chole 04/27/14 . Not limited by breathing from desired activities  Or cough. rec Serial f/u on plain films as can be seen easliy on lateral view from  05/2012    07/04/2014 f/u ov/[name] re: on mtx and doing well from RA perspective   Chief Complaint  Patient presents with  . Follow-up    CXR was done today. Pt states that he is doing well and denies any co's today      No obvious day to day or daytime variabilty or assoc sob or cough or cp or chest tightness, subjective wheeze overt sinus or hb symptoms. No unusual exp hx or h/o childhood pna/ asthma or knowledge of premature birth.  Sleeping ok without nocturnal  or early am exacerbation  of respiratory  c/o's or need for noct saba. Also denies any obvious fluctuation of symptoms with weather or environmental changes or other aggravating or alleviating factors except as outlined above   Current Medications, Allergies, Complete Past Medical History, Past Surgical History, Family History, and Social History were reviewed in [employer] record.  ROS  The following are not active complaints unless bolded sore throat, dysphagia, dental problems, itching, sneezing,  nasal congestion or excess/ purulent secretions, ear ache,   fever, chills, sweats, unintended wt loss, pleuritic or exertional cp, hemoptysis,  orthopnea pnd or leg swelling, presyncope, palpitations, heartburn, abdominal pain, anorexia, nausea, vomiting,  diarrhea  or change in bowel or urinary habits, change in stools or urine, dysuria,hematuria,  rash, arthralgias better , visual complaints, headache, numbness weakness or ataxia or problems with walking or coordination,  change in mood/affect or memory.                          Objective:   Physical Exam  amb wm nad   01/10/2014       154   > 07/04/2014  155  Wt Readings from Last 3 Encounters:  10/12/13 152 lb (68.947 kg)  08/19/13 151 lb (68.493 kg)  05/25/13 149 lb 12.8 oz (67.949 kg)     HEENT: nl dentition, turbinates, and orophanx. Nl external ear canals without cough reflex   NECK :  without JVD/Nodes/TM/ nl carotid upstrokes bilaterally   LUNGS: no acc muscle use, clear to A and P bilaterally without cough on insp or exp maneuvers   CV:  RRR  no s3 or murmur or increase in P2, no edema   ABD:  soft and nontender with nl excursion in the supine position. No bruits or organomegaly, bowel sounds nl  MS:  warm without deformities, calf tenderness, cyanosis or clubbing-     SKIN: warm and dry with nodules R palm   And L hypothenar area   cxr 07/04/14 Stable chest radiograph. No evidence of developing density in the right lower lung. See above discussion.  Assessment & Plan:

## 2014-07-05 ENCOUNTER — Encounter: Payer: Self-pay | Admitting: Internal Medicine

## 2014-07-05 NOTE — Assessment & Plan Note (Addendum)
-   never smoker - CT abd 04/26/13 morehead x 12 mm RLL - first seen 05/2012 plain film but not well on f/u cxr 04/30/14 as the RLL is atelectatic on that view - CT chest 09/08/13 no change on sagittal cuts vs 06/04/12 lateral view per addendum added by Rip Harbour Blietz> no lesions on cxr 01/10/2014  - cxr 07/04/14 vs 06/04/12 no sign nodule in RLL> no f/u needed   I had an extended summary discussion with the patient reviewing all relevant studies completed to date and  lasting 15 to 20 minutes of a 25 minute visit on the following ongoing concerns:  Most likely this was an RA related /inflammatory nodule in RA pt never smoker so no need for further cxrs  in the absence of new symptom, advised   Also discussed issue of RA lung dz vs dz related to treatment for RA esp MTX and may need serial cxr/ pfts on this basis but best way to follow is stay very active and let me or Rheumatology know about persistent or unexplained resp symptoms

## 2014-07-20 ENCOUNTER — Other Ambulatory Visit: Payer: Self-pay | Admitting: Family Medicine

## 2014-07-20 DIAGNOSIS — R911 Solitary pulmonary nodule: Secondary | ICD-10-CM

## 2014-07-30 ENCOUNTER — Telehealth: Payer: Self-pay | Admitting: Family Medicine

## 2014-07-30 NOTE — Telephone Encounter (Signed)
Patient wife notified that she can alternate ibuprofen and tylenol. Patient wife stated that he was not having any symptoms and I advised her that if he starts having any symptoms to have him checked out or if she can not control his fever.

## 2014-08-30 ENCOUNTER — Other Ambulatory Visit: Payer: Self-pay | Admitting: Family Medicine

## 2014-09-02 ENCOUNTER — Other Ambulatory Visit: Payer: Self-pay | Admitting: Family Medicine

## 2014-09-20 ENCOUNTER — Encounter: Payer: Self-pay | Admitting: Family Medicine

## 2014-09-20 ENCOUNTER — Ambulatory Visit (INDEPENDENT_AMBULATORY_CARE_PROVIDER_SITE_OTHER): Payer: Medicare Other | Admitting: Family Medicine

## 2014-09-20 VITALS — BP 114/71 | HR 66 | Temp 97.9°F | Ht 64.0 in | Wt 151.0 lb

## 2014-09-20 DIAGNOSIS — Z79899 Other long term (current) drug therapy: Secondary | ICD-10-CM

## 2014-09-20 DIAGNOSIS — N400 Enlarged prostate without lower urinary tract symptoms: Secondary | ICD-10-CM | POA: Diagnosis not present

## 2014-09-20 DIAGNOSIS — E785 Hyperlipidemia, unspecified: Secondary | ICD-10-CM | POA: Diagnosis not present

## 2014-09-20 DIAGNOSIS — E039 Hypothyroidism, unspecified: Secondary | ICD-10-CM | POA: Diagnosis not present

## 2014-09-20 DIAGNOSIS — M069 Rheumatoid arthritis, unspecified: Secondary | ICD-10-CM | POA: Diagnosis not present

## 2014-09-20 DIAGNOSIS — G40909 Epilepsy, unspecified, not intractable, without status epilepticus: Secondary | ICD-10-CM

## 2014-09-20 DIAGNOSIS — E559 Vitamin D deficiency, unspecified: Secondary | ICD-10-CM | POA: Diagnosis not present

## 2014-09-20 LAB — POCT CBC
GRANULOCYTE PERCENT: 65.5 % (ref 37–80)
HEMATOCRIT: 40.4 % — AB (ref 43.5–53.7)
Hemoglobin: 12.3 g/dL — AB (ref 14.1–18.1)
LYMPH, POC: 2.3 (ref 0.6–3.4)
MCH, POC: 30.7 pg (ref 27–31.2)
MCHC: 30.4 g/dL — AB (ref 31.8–35.4)
MCV: 100.8 fL — AB (ref 80–97)
MPV: 7.3 fL (ref 0–99.8)
POC Granulocyte: 5 (ref 2–6.9)
POC LYMPH %: 30.5 % (ref 10–50)
Platelet Count, POC: 359 10*3/uL (ref 142–424)
RBC: 4 M/uL — AB (ref 4.69–6.13)
RDW, POC: 14.8 %
WBC: 7.6 10*3/uL (ref 4.6–10.2)

## 2014-09-20 NOTE — Patient Instructions (Addendum)
Medicare Annual Wellness Visit  [facility] and the medical providers at [facility] strive to bring you the best medical care.  In doing so we not only want to address your current medical conditions and concerns but also to detect new conditions early and prevent illness, disease and health-related problems.    Medicare offers a yearly Wellness Visit which allows our clinical staff to assess your need for preventative services including immunizations, lifestyle education, counseling to decrease risk of preventable diseases and screening for fall risk and other medical concerns.    This visit is provided free of charge (no copay) for all Medicare recipients. The clinical pharmacists at [facility] have begun to conduct these Wellness Visits which will also include a thorough review of all your medications.    As you primary medical provider recommend that you make an appointment for your Annual Wellness Visit if you have not done so already this year.  You may set up this appointment before you leave today or you may call back ([phone]) and schedule an appointment.  Please make sure when you call that you mention that you are scheduling your Annual Wellness Visit with the clinical pharmacist so that the appointment may be made for the proper length of time.     Continue current medications. Continue good therapeutic lifestyle changes which include good diet and exercise. Fall precautions discussed with patient. If an FOBT was given today- please return it to our front desk. If you are over 79 years old - you may need Prevnar 32 or the adult Pneumonia vaccine.  Flu Shots are still available at our office. If you still haven't had one please call to set up a nurse visit to get one.   After your visit with [place] today you will receive a survey in the mail or online from [employer] regarding your care with [place]. Please take a moment to  fill this out. Your feedback is very important to [place] as you can help [place] better understand your patient needs as well as improve your experience and satisfaction. WE CARE ABOUT YOU!!!   Continue follow-up with specialist, rheumatology, neurology, and pulmonology as needed. We will call you with your lab work results once they become available Stay active and be careful not to put yourself at risk for falling

## 2014-09-20 NOTE — Progress Notes (Signed)
Subjective:    Patient ID: Anna Genre., male    DOB: Nov 29, 1945, 69 y.o.   MRN: [id number]  HPI Pt here for follow up and management of chronic medical problems which includes hypothyroid and hyperlipidemia. He is taking medications regularly. Ibn is doing well with no specific complaints. He was followed by the pulmonologist because of an abnormal CT scan. The pulmonologist has released him and says he does not need to get another CT scan.Marland Kitchen He also has a history of a seizure disorder and his followed by the neurologist for this. He sees the rheumatologist, Dr. Amil Amen for his rheumatoid arthritis which is in good control at the present time. He sees him every 4-6 months.          Patient Active Problem List   Diagnosis Date Noted  . Hypothyroidism 05/04/2014  . Solitary pulmonary nodule RLL lateral basal segment  10/12/2013  . Rheumatoid arthritis 08/19/2013  . High risk medication use 08/19/2013  . BPH (benign prostatic hyperplasia) 08/19/2013  . Seizure disorder 05/06/2013  . Acute gangrenous cholecystitis 04/28/2013  . Abnormal EKG 04/22/2012  . Hyperlipidemia   . Depressive disorder, not elsewhere classified   . Localization-related (focal) (partial) epilepsy and epileptic syndromes with complex partial seizures, without mention of intractable epilepsy   . Kidney stone   . Impotence of organic origin   . External hemorrhoid   . Nasal septal perforation   . Cataract   . Hyperplasia of prostate    Outpatient Encounter Prescriptions as of 09/20/2014  Medication Sig  . aspirin 81 MG EC tablet Take 81 mg by mouth daily.    Marland Kitchen atorvastatin (LIPITOR) 40 MG tablet TAKE 1 TABLET BY MOUTH EVERY DAY AS DIRECTED (Patient taking differently: TAKE 1 TABLET BY MOUTH EVERY DAY)  . Cholecalciferol (VITAMIN D3) 2000 UNITS TABS Take 1 tablet by mouth daily.    Marland Kitchen escitalopram (LEXAPRO) 20 MG tablet TAKE 1 TABLET DAILY  . ezetimibe (ZETIA) 10 MG tablet 1/2 tablet oral daily  . folic  acid (FOLVITE) 1 MG tablet Take 1 mg by mouth 2 (two) times daily.   Marland Kitchen lacosamide (VIMPAT) 200 MG TABS tablet Take 200 mg by mouth 2 (two) times daily.  Marland Kitchen levothyroxine (SYNTHROID, LEVOTHROID) 50 MCG tablet TAKE ONE TABLET BY MOUTH EVERY DAY  . methotrexate (RHEUMATREX) 5 MG tablet Take 10 mg by mouth once a week. Caution: Chemotherapy. Protect from light.  Marland Kitchen omeprazole (PRILOSEC) 20 MG capsule Take 20 mg by mouth daily.   . primidone (MYSOLINE) 250 MG tablet 1 1/2 tablets twice daily    Review of Systems  Constitutional: Negative.   HENT: Negative.   Eyes: Negative.   Respiratory: Negative.   Cardiovascular: Negative.   Gastrointestinal: Negative.   Endocrine: Negative.   Genitourinary: Negative.   Musculoskeletal: Negative.   Skin: Negative.   Allergic/Immunologic: Negative.   Neurological: Negative.   Hematological: Negative.   Psychiatric/Behavioral: Negative.        Objective:   Physical Exam  Constitutional: He is oriented to person, place, and time. He appears well-developed and well-nourished. No distress.  Patient is calm and alert and appears to be doing well and is keeping up with his ancillary appointments with other specialists.  HENT:  Head: Normocephalic and atraumatic.  Right Ear: External ear normal.  Left Ear: External ear normal.  Nose: Nose normal.  Mouth/Throat: Oropharynx is clear and moist. No oropharyngeal exudate.  Bilateral hearing aids and ear canals were clear  Eyes:  Conjunctivae and EOM are normal. Pupils are equal, round, and reactive to light. Right eye exhibits no discharge. Left eye exhibits no discharge. No scleral icterus.  Neck: Normal range of motion. Neck supple. No thyromegaly present.  No carotid bruits or anterior cervical adenopathy  Cardiovascular: Normal rate, regular rhythm, normal heart sounds and intact distal pulses.  Exam reveals no gallop and no friction rub.   No murmur heard. The heart had a regular rate and rhythm at 72/m    Pulmonary/Chest: Effort normal and breath sounds normal. No respiratory distress. He has no wheezes. He has no rales. He exhibits no tenderness.  clear anteriorly and posteriorly  Abdominal: Soft. Bowel sounds are normal. He exhibits no mass. There is no tenderness. There is no rebound and no guarding.  Musculoskeletal: Normal range of motion. He exhibits no edema.  Lymphadenopathy:    He has no cervical adenopathy.  Neurological: He is alert and oriented to person, place, and time. He has normal reflexes. No cranial nerve deficit.  Skin: Skin is warm and dry. No rash noted. No erythema. No pallor.  Psychiatric: He has a normal mood and affect. His behavior is normal. Judgment and thought content normal.  Nursing note and vitals reviewed.  BP 114/71 mmHg  Pulse 66  Temp(Src) 97.9 F (36.6 C) (Oral)  Ht 5' 4" (1.626 m)  Wt 151 lb (68.493 kg)  BMI 25.91 kg/m2        Assessment & Plan:  1. BPH (benign prostatic hyperplasia) -The patient is having no significant problems with this other than nocturia 1-2 times. - POCT CBC  2. High risk medication use -Follow-up with rheumatology - POCT CBC - BMP8+EGFR - Hepatic function panel  3. Hyperlipidemia -Continue atorvastatin and aggressive therapeutic lifestyle changes - POCT CBC - BMP8+EGFR - Hepatic function panel - NMR, lipoprofile  4. Seizure disorder -Continue follow-up with neurology every 4 months - POCT CBC  5. Hypothyroidism, unspecified hypothyroidism type -Continue current treatment and any changes will be made pending results of lab work - POCT CBC - Thyroid Panel With TSH  6. Vitamin D deficiency -Continue current treatment and any changes made will be determined after lab work is returned - POCT CBC - Vit D  25 hydroxy (rtn osteoporosis monitoring)  7. Rheumatoid arthritis -Continue follow-up with Dr. Amil Amen  Patient Instructions                       Medicare Annual [facility] and  the medical providers at [facility] strive to bring you the best medical care.  In doing so we not only want to address your current medical conditions and concerns but also to detect new conditions early and prevent illness, disease and health-related problems.    Medicare offers a yearly Wellness Visit which allows our clinical staff to assess your need for preventative services including immunizations, lifestyle education, counseling to decrease risk of preventable diseases and screening for fall risk and other medical concerns.    This visit is provided free of charge (no copay) for all Medicare recipients. The clinical pharmacists at [facility] have begun to conduct these Wellness Visits which will also include a thorough review of all your medications.    As you primary medical provider recommend that you make an appointment for your Annual Wellness Visit if you have not done so already this year.  You may set up this appointment before you leave today  or you may call back ([phone]) and schedule an appointment.  Please make sure when you call that you mention that you are scheduling your Annual Wellness Visit with the clinical pharmacist so that the appointment may be made for the proper length of time.     Continue current medications. Continue good therapeutic lifestyle changes which include good diet and exercise. Fall precautions discussed with patient. If an FOBT was given today- please return it to our front desk. If you are over 49 years old - you may need Prevnar 52 or the adult Pneumonia vaccine.  Flu Shots are still available at our office. If you still haven't had one please call to set up a nurse visit to get one.   After your visit with [place] today you will receive a survey in the mail or online from [employer] regarding your care with [place]. Please take a moment to fill this out. Your feedback is very important to [place] as you can help [place]  better understand your patient needs as well as improve your experience and satisfaction. WE CARE ABOUT YOU!!!   Continue follow-up with specialist, rheumatology, neurology, and pulmonology as needed. We will call you with your lab work results once they become available Stay active and be careful not to put yourself at risk for falling   Arrie Senate MD

## 2014-09-21 LAB — NMR, LIPOPROFILE
CHOLESTEROL: 143 mg/dL (ref 100–199)
HDL Cholesterol by NMR: 62 mg/dL (ref >39)
HDL PARTICLE NUMBER: 31.1 umol/L (ref >=30.5)
LDL Particle Number: 971 nmol/L (ref <1000)
LDL Size: 21.1 nm (ref >20.5)
LDL-C: 58 mg/dL (ref 0–99)
Small LDL Particle Number: 445 nmol/L (ref <=527)
Triglycerides by NMR: 113 mg/dL (ref 0–149)

## 2014-09-21 LAB — HEPATIC FUNCTION PANEL
ALT: 23 IU/L (ref 0–44)
AST: 30 IU/L (ref 0–40)
Albumin: 4.3 g/dL (ref 3.6–4.8)
Alkaline Phosphatase: 94 IU/L (ref 39–117)
BILIRUBIN, DIRECT: 0.08 mg/dL (ref 0.00–0.40)
Bilirubin Total: 0.2 mg/dL (ref 0.0–1.2)
Total Protein: 7.6 g/dL (ref 6.0–8.5)

## 2014-09-21 LAB — BMP8+EGFR
BUN / CREAT RATIO: 12 (ref 10–22)
BUN: 13 mg/dL (ref 8–27)
CO2: 28 mmol/L (ref 18–29)
CREATININE: 1.12 mg/dL (ref 0.76–1.27)
Calcium: 9.3 mg/dL (ref 8.6–10.2)
Chloride: 99 mmol/L (ref 97–108)
GFR calc non Af Amer: 67 mL/min/{1.73_m2} (ref >59)
GFR, EST AFRICAN AMERICAN: 77 mL/min/{1.73_m2} (ref >59)
GLUCOSE: 90 mg/dL (ref 65–99)
POTASSIUM: 4.8 mmol/L (ref 3.5–5.2)
Sodium: 141 mmol/L (ref 134–144)

## 2014-09-21 LAB — THYROID PANEL WITH TSH
FREE THYROXINE INDEX: 1.9 (ref 1.2–4.9)
T3 Uptake Ratio: 26 % (ref 24–39)
T4, Total: 7.4 ug/dL (ref 4.5–12.0)
TSH: 3.46 u[IU]/mL (ref 0.450–4.500)

## 2014-09-21 LAB — VITAMIN D 25 HYDROXY (VIT D DEFICIENCY, FRACTURES): Vit D, 25-Hydroxy: 51.3 ng/mL (ref 30.0–100.0)

## 2014-09-23 ENCOUNTER — Other Ambulatory Visit: Payer: Self-pay | Admitting: *Deleted

## 2014-09-23 DIAGNOSIS — R799 Abnormal finding of blood chemistry, unspecified: Secondary | ICD-10-CM

## 2014-09-23 NOTE — Addendum Note (Signed)
Addended by: Jamelle Haring on: 09/23/2014 03:03 PM   Modules accepted: Orders

## 2014-09-26 ENCOUNTER — Encounter: Payer: Self-pay | Admitting: Family Medicine

## 2014-11-23 ENCOUNTER — Other Ambulatory Visit: Payer: Self-pay | Admitting: Family Medicine

## 2015-01-27 ENCOUNTER — Ambulatory Visit (INDEPENDENT_AMBULATORY_CARE_PROVIDER_SITE_OTHER): Payer: Medicare Other | Admitting: Family Medicine

## 2015-01-27 ENCOUNTER — Encounter: Payer: Self-pay | Admitting: Family Medicine

## 2015-01-27 ENCOUNTER — Telehealth: Payer: Self-pay | Admitting: *Deleted

## 2015-01-27 VITALS — BP 115/67 | HR 72 | Temp 98.8°F | Ht 64.0 in | Wt 150.2 lb

## 2015-01-27 DIAGNOSIS — R9389 Abnormal findings on diagnostic imaging of other specified body structures: Secondary | ICD-10-CM

## 2015-01-27 DIAGNOSIS — H9193 Unspecified hearing loss, bilateral: Secondary | ICD-10-CM

## 2015-01-27 DIAGNOSIS — R938 Abnormal findings on diagnostic imaging of other specified body structures: Secondary | ICD-10-CM

## 2015-01-27 DIAGNOSIS — E559 Vitamin D deficiency, unspecified: Secondary | ICD-10-CM | POA: Diagnosis not present

## 2015-01-27 DIAGNOSIS — F32A Depression, unspecified: Secondary | ICD-10-CM

## 2015-01-27 DIAGNOSIS — M069 Rheumatoid arthritis, unspecified: Secondary | ICD-10-CM | POA: Diagnosis not present

## 2015-01-27 DIAGNOSIS — E785 Hyperlipidemia, unspecified: Secondary | ICD-10-CM

## 2015-01-27 DIAGNOSIS — N400 Enlarged prostate without lower urinary tract symptoms: Secondary | ICD-10-CM

## 2015-01-27 DIAGNOSIS — F329 Major depressive disorder, single episode, unspecified: Secondary | ICD-10-CM

## 2015-01-27 DIAGNOSIS — G40909 Epilepsy, unspecified, not intractable, without status epilepticus: Secondary | ICD-10-CM

## 2015-01-27 LAB — POCT CBC
GRANULOCYTE PERCENT: 71.9 % (ref 37–80)
HEMATOCRIT: 37.1 % — AB (ref 43.5–53.7)
HEMOGLOBIN: 12.2 g/dL — AB (ref 14.1–18.1)
Lymph, poc: 1.6 (ref 0.6–3.4)
MCH, POC: 31.7 pg — AB (ref 27–31.2)
MCHC: 32.7 g/dL (ref 31.8–35.4)
MCV: 96.7 fL (ref 80–97)
MPV: 8.2 fL (ref 0–99.8)
POC GRANULOCYTE: 5.1 (ref 2–6.9)
POC LYMPH PERCENT: 22.9 %L (ref 10–50)
Platelet Count, POC: 290 10*3/uL (ref 142–424)
RBC: 3.84 M/uL — AB (ref 4.69–6.13)
RDW, POC: 15.5 %
WBC: 7.1 10*3/uL (ref 4.6–10.2)

## 2015-01-27 NOTE — Progress Notes (Signed)
Subjective:    Patient ID: Troy Genre., male    DOB: 03/23/1946, 69 y.o.   MRN: [id number]  HPI Patient is here today for a follow up on his chronic medical problems. The patient is doing well overall and continues to be followed by the neurologist and the rheumatologist. He has a history of hyperlipidemia hypothyroidism rheumatoid arthritis and seizure disorder. He also has a history of BPH. He has no specific complaints today. He is been taking his medication regularly. His medicine from the neurologist has been reduced and as a result of this he has had less staggering and dizziness. The patient denies chest pain shortness of breath problems with his stoma like heartburn indigestion nausea and vomiting and blood in the stool or black tarry bowel movements. He is passing his water without problems. He walks daily. On reviewing his family history with him today both of his parents lived to be in their 61s his mom died of old age and his father died of Alzheimer's.   Review of Systems  Constitutional: Negative.   HENT: Negative.   Eyes: Negative.   Respiratory: Negative.   Cardiovascular: Negative.   Gastrointestinal: Negative.   Endocrine: Negative.   Genitourinary: Negative.   Musculoskeletal: Negative.   Skin: Negative.   Allergic/Immunologic: Negative.   Neurological: Negative.   Hematological: Negative.   Psychiatric/Behavioral: Negative.          Patient Active Problem List   Diagnosis Date Noted  . Hypothyroidism 05/04/2014  . Solitary pulmonary nodule RLL lateral basal segment  10/12/2013  . Rheumatoid arthritis 08/19/2013  . High risk medication use 08/19/2013  . BPH (benign prostatic hyperplasia) 08/19/2013  . Seizure disorder 05/06/2013  . Acute gangrenous cholecystitis 04/28/2013  . Abnormal EKG 04/22/2012  . Hyperlipidemia   . Depressive disorder, not elsewhere classified   . Localization-related (focal) (partial) epilepsy and epileptic syndromes with  complex partial seizures, without mention of intractable epilepsy   . Kidney stone   . Impotence of organic origin   . External hemorrhoid   . Nasal septal perforation   . Cataract   . Hyperplasia of prostate    Outpatient Encounter Prescriptions as of 01/27/2015  Medication Sig  . aspirin 81 MG EC tablet Take 81 mg by mouth daily.    Marland Kitchen atorvastatin (LIPITOR) 40 MG tablet TAKE 1 TABLET BY MOUTH EVERY DAY AS DIRECTED  . Cholecalciferol (VITAMIN D3) 2000 UNITS TABS Take 1 tablet by mouth daily.    Marland Kitchen escitalopram (LEXAPRO) 20 MG tablet TAKE 1 TABLET DAILY  . ezetimibe (ZETIA) 10 MG tablet 1/2 tablet oral daily  . folic acid (FOLVITE) 1 MG tablet Take 1 mg by mouth 2 (two) times daily.   Marland Kitchen lacosamide (VIMPAT) 200 MG TABS tablet Take 200 mg by mouth 2 (two) times daily.  Marland Kitchen levothyroxine (SYNTHROID, LEVOTHROID) 50 MCG tablet TAKE ONE TABLET BY MOUTH EVERY DAY  . methotrexate (RHEUMATREX) 5 MG tablet Take 10 mg by mouth once a week. Caution: Chemotherapy. Protect from light.  Marland Kitchen omeprazole (PRILOSEC) 20 MG capsule Take 20 mg by mouth daily.   . primidone (MYSOLINE) 250 MG tablet 1 1/2 tablets twice daily  . traMADol (ULTRAM) 50 MG tablet Take by mouth every 6 (six) hours as needed. Patient takes 2.5 daily   No facility-administered encounter medications on file as of 01/27/2015.      Objective:   Physical Exam  Constitutional: He is oriented to person, place, and time. He appears well-developed and  well-nourished. No distress.  The patient is pleasant and alert  HENT:  Head: Normocephalic and atraumatic.  Right Ear: External ear normal.  Left Ear: External ear normal.  Nose: Nose normal.  Mouth/Throat: Oropharynx is clear and moist. No oropharyngeal exudate.  He wears bilateral hearing aids  Eyes: Conjunctivae and EOM are normal. Pupils are equal, round, and reactive to light. Right eye exhibits no discharge. Left eye exhibits no discharge. No scleral icterus.  He is up-to-date on his eye  exams and wears glasses  Neck: Normal range of motion. Neck supple. No thyromegaly present.  The neck was without bruits or adenopathy or thyromegaly  Cardiovascular: Normal rate, regular rhythm and intact distal pulses.   No murmur heard. The heart had a regular rate and rhythm at 72/m.  Pulmonary/Chest: Effort normal and breath sounds normal. No respiratory distress. He has no wheezes. He has no rales. He exhibits no tenderness.  Lungs were clear anteriorly and posteriorly and axillary regions were negative for adenopathy there were no chest masses  Abdominal: Soft. Bowel sounds are normal. He exhibits no mass. There is no tenderness. There is no rebound and no guarding.  No abdominal tenderness or organ enlargement  Genitourinary: Rectum normal and penis normal.  The prostate was slightly enlarged but soft and smooth. There are no lumps or masses. There are no rectal masses. External genitalia were normal and there are no hernias palpated.  Musculoskeletal: Normal range of motion. He exhibits no edema or tenderness.  The patient is slightly kyphotic.  Lymphadenopathy:    He has no cervical adenopathy.  Neurological: He is alert and oriented to person, place, and time. He has normal reflexes. No cranial nerve deficit.  Skin: Skin is warm and dry. No rash noted. No erythema. No pallor.  Psychiatric: He has a normal mood and affect. His behavior is normal. Judgment and thought content normal.  Nursing note and vitals reviewed.  BP 115/67 mmHg  Pulse 72  Temp(Src) 98.8 F (37.1 C) (Oral)  Ht _0  (1.626 m)  Wt 150 lb 3.2 oz (68.13 kg)  BMI 25.77 kg/m2        Assessment & Plan:  1. Hyperlipidemia -The patient should continue with his current treatment pending results of lab work - BMP8+EGFR - Hepatic function panel - NMR, lipoprofile  2. Vitamin D deficiency -The patient should continue with his current treatment pending results of lab work - POCT CBC - Vit D  25 hydroxy  (rtn osteoporosis monitoring)  3. Rheumatoid arthritis -He should continue with follow-up with the rheumatologist as planned which currently is about every 4 months  4. Seizure disorder -He should continue with follow-up with the neurologist as planned which is also about every 4 months  5. BPH (benign prostatic hyperplasia) -Prostate was slightly enlarged but the patient is having no symptoms with voiding. - PSA, total and free  6. Diminished hearing, bilateral -He wears by lateral hearing aids and seemed to have no problem with his hearing during the visit today.  7. Depression -He should continue with his Lexapro as doing  8. Hypothyroidism -Should continue with his current thyroid medication pending results of lab work  9. Abnormal chest CT -According to the last chest CT which was done in April 2015 1 [name] was recommended by the radiologist and we will order this.  Meds ordered this encounter  Medications  . traMADol (ULTRAM) 50 MG tablet    Sig: Take by mouth every 6 (six) hours as needed.  Patient takes 2.5 daily   Patient Instructions  Continue current medications. Continue good therapeutic lifestyle changes which include good diet and exercise. Fall precautions discussed with patient. If an FOBT was given today- please return it to our front desk. If you are over 44 years old - you may need Prevnar 7 or the adult Pneumonia vaccine.  Flu Shots will be available at our office starting mid- September. Please call and schedule a FLU CLINIC APPOINTMENT.                        Medicare Annual Wellness Visit  [facility] and the medical providers at [facility] strive to bring you the best medical care.  In doing so we not only want to address your current medical conditions and concerns but also to detect new conditions early and prevent illness, disease and health-related problems.    Medicare offers a yearly Wellness Visit which allows our  clinical staff to assess your need for preventative services including immunizations, lifestyle education, counseling to decrease risk of preventable diseases and screening for fall risk and other medical concerns.    This visit is provided free of charge (no copay) for all Medicare recipients. The clinical pharmacists at [facility] have begun to conduct these Wellness Visits which will also include a thorough review of all your medications.    As you primary medical provider recommend that you make an appointment for your Annual Wellness Visit if you have not done so already this year.  You may set up this appointment before you leave today or you may call back ([phone]) and schedule an appointment.  Please make sure when you call that you mention that you are scheduling your Annual Wellness Visit with the clinical pharmacist so that the appointment may be made for the proper length of time.    The patient should continue to follow-up regularly with his rheumatologist and with his neurologist He should continue to walk and exercise regularly and drink plenty of fluids especially this summer to keep from getting dehydrated He should return the FOBT card He should continue to take his medication as he is doing We will call with the lab work once it becomes available   Arrie Senate MD

## 2015-01-27 NOTE — Patient Instructions (Addendum)
Continue current medications. Continue good therapeutic lifestyle changes which include good diet and exercise. Fall precautions discussed with patient. If an FOBT was given today- please return it to our front desk. If you are over 69 years old - you may need Prevnar 6 or the adult Pneumonia vaccine.  Flu Shots will be available at our office starting mid- September. Please call and schedule a FLU CLINIC APPOINTMENT.                        Medicare Annual Wellness Visit  [facility] and the medical providers at [facility] strive to bring you the best medical care.  In doing so we not only want to address your current medical conditions and concerns but also to detect new conditions early and prevent illness, disease and health-related problems.    Medicare offers a yearly Wellness Visit which allows our clinical staff to assess your need for preventative services including immunizations, lifestyle education, counseling to decrease risk of preventable diseases and screening for fall risk and other medical concerns.    This visit is provided free of charge (no copay) for all Medicare recipients. The clinical pharmacists at [facility] have begun to conduct these Wellness Visits which will also include a thorough review of all your medications.    As you primary medical provider recommend that you make an appointment for your Annual Wellness Visit if you have not done so already this year.  You may set up this appointment before you leave today or you may call back ([phone]) and schedule an appointment.  Please make sure when you call that you mention that you are scheduling your Annual Wellness Visit with the clinical pharmacist so that the appointment may be made for the proper length of time.    The patient should continue to follow-up regularly with his rheumatologist and with his neurologist He should continue to walk and exercise regularly and  drink plenty of fluids especially this summer to keep from getting dehydrated He should return the FOBT card He should continue to take his medication as he is doing We will call with the lab work once it becomes available

## 2015-01-27 NOTE — Addendum Note (Signed)
Addended by: Thana Ates on: 01/27/2015 03:37 PM   Modules accepted: Orders, Medications

## 2015-01-27 NOTE — Telephone Encounter (Signed)
Patients wife states that they are not opposed to having the ct scan but that they would like to wait until his next visit to reevaluate since they just saw Dr. Melvyn Novas in December.

## 2015-01-28 LAB — NMR, LIPOPROFILE
Cholesterol: 138 mg/dL (ref 100–199)
HDL Cholesterol by NMR: 52 mg/dL (ref >39)
HDL PARTICLE NUMBER: 28.4 umol/L — AB (ref >=30.5)
LDL Particle Number: 832 nmol/L (ref <1000)
LDL SIZE: 21 nm (ref >20.5)
LDL-C: 65 mg/dL (ref 0–99)
LP-IR SCORE: 32 (ref <=45)
Small LDL Particle Number: 415 nmol/L (ref <=527)
TRIGLYCERIDES BY NMR: 103 mg/dL (ref 0–149)

## 2015-01-28 LAB — HEPATIC FUNCTION PANEL
ALT: 20 IU/L (ref 0–44)
AST: 27 IU/L (ref 0–40)
Albumin: 4.3 g/dL (ref 3.6–4.8)
Alkaline Phosphatase: 99 IU/L (ref 39–117)
Bilirubin Total: 0.2 mg/dL (ref 0.0–1.2)
Bilirubin, Direct: 0.08 mg/dL (ref 0.00–0.40)
Total Protein: 7.2 g/dL (ref 6.0–8.5)

## 2015-01-28 LAB — BMP8+EGFR
BUN/Creatinine Ratio: 14 (ref 10–22)
BUN: 16 mg/dL (ref 8–27)
CO2: 26 mmol/L (ref 18–29)
Calcium: 9.3 mg/dL (ref 8.6–10.2)
Chloride: 98 mmol/L (ref 97–108)
Creatinine, Ser: 1.12 mg/dL (ref 0.76–1.27)
GFR calc Af Amer: 77 mL/min/{1.73_m2} (ref >59)
GFR calc non Af Amer: 67 mL/min/{1.73_m2} (ref >59)
Glucose: 90 mg/dL (ref 65–99)
POTASSIUM: 4.7 mmol/L (ref 3.5–5.2)
SODIUM: 140 mmol/L (ref 134–144)

## 2015-01-28 LAB — PSA, TOTAL AND FREE
PROSTATE SPECIFIC AG, SERUM: 0.3 ng/mL (ref 0.0–4.0)
PSA FREE PCT: 56.7 %
PSA FREE: 0.17 ng/mL

## 2015-01-28 LAB — VITAMIN D 25 HYDROXY (VIT D DEFICIENCY, FRACTURES): VIT D 25 HYDROXY: 56.5 ng/mL (ref 30.0–100.0)

## 2015-02-08 ENCOUNTER — Other Ambulatory Visit: Payer: Medicare Other

## 2015-02-08 DIAGNOSIS — Z1212 Encounter for screening for malignant neoplasm of rectum: Secondary | ICD-10-CM

## 2015-02-08 NOTE — Progress Notes (Signed)
Lab only 

## 2015-02-10 ENCOUNTER — Encounter: Payer: Self-pay | Admitting: Family Medicine

## 2015-02-10 LAB — FECAL OCCULT BLOOD, IMMUNOCHEMICAL: Fecal Occult Bld: NEGATIVE

## 2015-03-01 ENCOUNTER — Other Ambulatory Visit: Payer: Self-pay | Admitting: Family Medicine

## 2015-03-05 ENCOUNTER — Other Ambulatory Visit: Payer: Self-pay | Admitting: Family Medicine

## 2015-04-02 ENCOUNTER — Other Ambulatory Visit: Payer: Self-pay | Admitting: Family Medicine

## 2015-04-13 ENCOUNTER — Ambulatory Visit: Payer: Medicare Other

## 2015-04-19 ENCOUNTER — Ambulatory Visit (INDEPENDENT_AMBULATORY_CARE_PROVIDER_SITE_OTHER): Payer: Medicare Other

## 2015-04-19 DIAGNOSIS — Z23 Encounter for immunization: Secondary | ICD-10-CM | POA: Diagnosis not present

## 2015-04-20 ENCOUNTER — Ambulatory Visit: Payer: Medicare Other | Admitting: Nurse Practitioner

## 2015-05-01 ENCOUNTER — Other Ambulatory Visit: Payer: Self-pay | Admitting: Family Medicine

## 2015-06-04 ENCOUNTER — Other Ambulatory Visit: Payer: Self-pay | Admitting: Family Medicine

## 2015-06-23 ENCOUNTER — Encounter: Payer: Self-pay | Admitting: Family Medicine

## 2015-06-23 ENCOUNTER — Ambulatory Visit (INDEPENDENT_AMBULATORY_CARE_PROVIDER_SITE_OTHER): Payer: Medicare Other | Admitting: Family Medicine

## 2015-06-23 VITALS — BP 117/56 | HR 63 | Temp 97.3°F | Ht 64.0 in | Wt 150.0 lb

## 2015-06-23 DIAGNOSIS — E785 Hyperlipidemia, unspecified: Secondary | ICD-10-CM

## 2015-06-23 DIAGNOSIS — N400 Enlarged prostate without lower urinary tract symptoms: Secondary | ICD-10-CM

## 2015-06-23 DIAGNOSIS — R911 Solitary pulmonary nodule: Secondary | ICD-10-CM

## 2015-06-23 DIAGNOSIS — M0579 Rheumatoid arthritis with rheumatoid factor of multiple sites without organ or systems involvement: Secondary | ICD-10-CM

## 2015-06-23 DIAGNOSIS — E559 Vitamin D deficiency, unspecified: Secondary | ICD-10-CM | POA: Diagnosis not present

## 2015-06-23 DIAGNOSIS — E039 Hypothyroidism, unspecified: Secondary | ICD-10-CM | POA: Diagnosis not present

## 2015-06-23 DIAGNOSIS — R9389 Abnormal findings on diagnostic imaging of other specified body structures: Secondary | ICD-10-CM

## 2015-06-23 DIAGNOSIS — R938 Abnormal findings on diagnostic imaging of other specified body structures: Secondary | ICD-10-CM

## 2015-06-23 DIAGNOSIS — G40909 Epilepsy, unspecified, not intractable, without status epilepticus: Secondary | ICD-10-CM | POA: Diagnosis not present

## 2015-06-23 NOTE — Progress Notes (Signed)
Subjective:    Patient ID: Troy Genre., male    DOB: [birth year], 69 y.o.   MRN: [id number]  HPI Pt here for follow up and management of chronic medical problems which includes hyperlipidemia, seizure disorder, and hypothyroid. He is taking medications regularly. The patient is doing well overall. He is followed regularly by his rheumatologist for his rheumatoid arthritis and by the neurologist for seizure disorder. Both of these disease processes are stable. He otherwise denies chest pain shortness of breath trouble swallowing and heartburn indigestion nausea vomiting diarrhea or blood in the stool. He is active without problems his joints are under good control and his seizure disorder is under good control.     Patient Active Problem List   Diagnosis Date Noted  . Hypothyroidism 05/04/2014  . Solitary pulmonary nodule RLL lateral basal segment  10/12/2013  . Rheumatoid arthritis ([place]) 08/19/2013  . High risk medication use 08/19/2013  . BPH (benign prostatic hyperplasia) 08/19/2013  . Seizure disorder ([place]) 05/06/2013  . Acute gangrenous cholecystitis 04/28/2013  . Abnormal EKG 04/22/2012  . Hyperlipidemia   . Depressive disorder, not elsewhere classified   . Localization-related (focal) (partial) epilepsy and epileptic syndromes with complex partial seizures, without mention of intractable epilepsy   . Kidney stone   . Impotence of organic origin   . External hemorrhoid   . Nasal septal perforation   . Cataract   . Hyperplasia of prostate    Outpatient Encounter Prescriptions as of 06/23/2015  Medication Sig  . aspirin 81 MG EC tablet Take 81 mg by mouth daily.    Marland Kitchen atorvastatin (LIPITOR) 40 MG tablet TAKE 1 TABLET BY MOUTH EVERY DAY AS DIRECTED  . Cholecalciferol (VITAMIN D3) 2000 UNITS TABS Take 1 tablet by mouth daily.    Marland Kitchen escitalopram (LEXAPRO) 20 MG tablet TAKE 1 TABLET DAILY  . folic acid (FOLVITE) 1 MG tablet Take 1 mg by mouth 2 (two) times daily.   Marland Kitchen lacosamide  (VIMPAT) 200 MG TABS tablet Take 200 mg by mouth 2 (two) times daily.  Marland Kitchen levothyroxine (SYNTHROID, LEVOTHROID) 50 MCG tablet TAKE ONE TABLET BY MOUTH EVERY DAY  . methotrexate (RHEUMATREX) 2.5 MG tablet Take 10 mg by mouth once a week.   . omega-3 fish oil (MAXEPA) 1000 MG CAPS capsule Take 2 capsules by mouth daily.  Marland Kitchen omeprazole (PRILOSEC) 20 MG capsule Take 20 mg by mouth daily.   . primidone (MYSOLINE) 250 MG tablet 1 in the am and 1 1/2 at bedtime  . ZETIA 10 MG tablet TAKE ONE TABLET BY MOUTH DAILY FOR HYPERLIPIDERMEA  . [DISCONTINUED] ezetimibe (ZETIA) 10 MG tablet 1/2 tablet oral daily   No facility-administered encounter medications on file as of 06/23/2015.      Review of Systems  Constitutional: Negative.   HENT: Negative.   Eyes: Negative.   Respiratory: Negative.   Cardiovascular: Negative.   Gastrointestinal: Negative.   Endocrine: Negative.   Genitourinary: Negative.   Musculoskeletal: Negative.   Skin: Negative.   Allergic/Immunologic: Negative.   Neurological: Negative.   Hematological: Negative.   Psychiatric/Behavioral: Negative.        Objective:   Physical Exam  Constitutional: He is oriented to person, place, and time. He appears well-developed and well-nourished.  HENT:  Head: Normocephalic and atraumatic.  Right Ear: External ear normal.  Left Ear: External ear normal.  Nose: Nose normal.  Mouth/Throat: Oropharynx is clear and moist. No oropharyngeal exudate.  Patient wears bilateral hearing aids.  Eyes: Conjunctivae and  EOM are normal. Pupils are equal, round, and reactive to light. Right eye exhibits no discharge. Left eye exhibits no discharge. No scleral icterus.  Neck: Normal range of motion. Neck supple. No thyromegaly present.  Without bruits thyromegaly or anterior cervical adenopathy  Cardiovascular: Normal rate, regular rhythm, normal heart sounds and intact distal pulses.   No murmur heard. At 72/m  Pulmonary/Chest: Effort normal and  breath sounds normal. No respiratory distress. He has no wheezes. He has no rales. He exhibits no tenderness.  Clear anteriorly and posteriorly no axillary adenopathy  Abdominal: Soft. Bowel sounds are normal. He exhibits no mass. There is no tenderness. There is no rebound and no guarding.  No organ enlargement or abdominal tenderness and no bruits  Musculoskeletal: Normal range of motion. He exhibits no edema.  Good mobility of all joints and no rubor or swelling and no complaints  Lymphadenopathy:    He has no cervical adenopathy.  Neurological: He is alert and oriented to person, place, and time. He has normal reflexes. No cranial nerve deficit.  Skin: Skin is warm and dry. No rash noted.  Psychiatric: He has a normal mood and affect. His behavior is normal. Judgment and thought content normal.  Nursing note and vitals reviewed.    BP 117/56 mmHg  Pulse 63  Temp(Src) 97.3 F (36.3 C) (Oral)  Ht '5\' 4"'  (1.626 m)  Wt 150 lb (68.04 kg)  BMI 25.73 kg/m2      Assessment & Plan:  1. Hyperlipidemia -Continue current treatment pending results of lab work. Continue aggressive therapeutic lifestyle changes. - BMP8+EGFR - CBC with Differential/Platelet - Hepatic function panel - NMR, lipoprofile  2. Vitamin D deficiency -Continue current treatment pending results of lab work - CBC with Differential/Platelet - VITAMIN D 25 Hydroxy (Vit-D Deficiency, Fractures)  3. BPH (benign prostatic hyperplasia) -No complaints with BPH - CBC with Differential/Platelet  4. Seizure disorder ([place]) -No seizures recorded and patient continues to follow-up with his neurologist - CBC with Differential/Platelet  5. Hypothyroidism, unspecified hypothyroidism type -Continue current treatment pending results of lab work - CBC with Differential/Platelet - Thyroid Panel With TSH  6. Rheumatoid arthritis involving multiple sites with positive rheumatoid factor ([place]) -Continue follow-up with  rheumatologist and recommended treatment  7. Lung nodule -The patient was seen a pulmonologist but no further workup was necessary for the lung nodule.  8. Abnormal chest CT -No further workup necessary.  Patient Instructions                       Medicare Annual Wellness Visit  [facility] and the medical providers at [facility] strive to bring you the best medical care.  In doing so we not only want to address your current medical conditions and concerns but also to detect new conditions early and prevent illness, disease and health-related problems.    Medicare offers a yearly Wellness Visit which allows our clinical staff to assess your need for preventative services including immunizations, lifestyle education, counseling to decrease risk of preventable diseases and screening for fall risk and other medical concerns.    This visit is provided free of charge (no copay) for all Medicare recipients. The clinical pharmacists at [facility] have begun to conduct these Wellness Visits which will also include a thorough review of all your medications.    As you primary medical provider recommend that you make an appointment for your Annual Wellness Visit if you have not  done so already this year.  You may set up this appointment before you leave today or you may call back ([phone]) and schedule an appointment.  Please make sure when you call that you mention that you are scheduling your Annual Wellness Visit with the clinical pharmacist so that the appointment may be made for the proper length of time.    Continue current medications. Continue good therapeutic lifestyle changes which include good diet and exercise. Fall precautions discussed with patient. If an FOBT was given today- please return it to our front desk. If you are over 63 years old - you may need Prevnar 74 or the adult Pneumonia vaccine.  **Flu shots are available--- please call  and schedule a FLU-CLINIC appointment**  After your visit with [place] today you will receive a survey in the mail or online from [employer] regarding your care with [place]. Please take a moment to fill this out. Your feedback is very important to [place] as you can help [place] better understand your patient needs as well as improve your experience and satisfaction. WE CARE ABOUT YOU!!!   Keep calluses checked regularly Use a good moisturizer - Aveeno, Moisturel, or Eucerin  Continue follow-up appointments with rheumatologist and neurologist   Arrie Senate MD

## 2015-06-23 NOTE — Patient Instructions (Addendum)
Medicare Annual Wellness Visit  [facility] and the medical providers at [facility] strive to bring you the best medical care.  In doing so we not only want to address your current medical conditions and concerns but also to detect new conditions early and prevent illness, disease and health-related problems.    Medicare offers a yearly Wellness Visit which allows our clinical staff to assess your need for preventative services including immunizations, lifestyle education, counseling to decrease risk of preventable diseases and screening for fall risk and other medical concerns.    This visit is provided free of charge (no copay) for all Medicare recipients. The clinical pharmacists at [facility] have begun to conduct these Wellness Visits which will also include a thorough review of all your medications.    As you primary medical provider recommend that you make an appointment for your Annual Wellness Visit if you have not done so already this year.  You may set up this appointment before you leave today or you may call back [phone] and schedule an appointment.  Please make sure when you call that you mention that you are scheduling your Annual Wellness Visit with the clinical pharmacist so that the appointment may be made for the proper length of time.    Continue current medications. Continue good therapeutic lifestyle changes which include good diet and exercise. Fall precautions discussed with patient. If an FOBT was given today- please return it to our front desk. If you are over 7 years old - you may need Prevnar 51 or the adult Pneumonia vaccine.  **Flu shots are available--- please call and schedule a FLU-CLINIC appointment**  After your visit with [place] today you will receive a survey in the mail or online from [employer] regarding your care with [place]. Please take a moment to fill this out. Your feedback is very  important to [place] as you can help [place] better understand your patient needs as well as improve your experience and satisfaction. WE CARE ABOUT YOU!!!   Keep calluses checked regularly Use a good moisturizer - Aveeno, Moisturel, or Eucerin  Continue follow-up appointments with rheumatologist and neurologist

## 2015-06-24 LAB — HEPATIC FUNCTION PANEL
ALBUMIN: 4.2 g/dL (ref 3.6–4.8)
ALT: 20 IU/L (ref 0–44)
AST: 30 IU/L (ref 0–40)
Alkaline Phosphatase: 82 IU/L (ref 39–117)
BILIRUBIN TOTAL: 0.2 mg/dL (ref 0.0–1.2)
Bilirubin, Direct: 0.09 mg/dL (ref 0.00–0.40)
TOTAL PROTEIN: 6.6 g/dL (ref 6.0–8.5)

## 2015-06-24 LAB — NMR, LIPOPROFILE
CHOLESTEROL: 130 mg/dL (ref 100–199)
HDL Cholesterol by NMR: 54 mg/dL (ref >39)
HDL Particle Number: 27.2 umol/L — ABNORMAL LOW (ref >=30.5)
LDL Particle Number: 687 nmol/L (ref <1000)
LDL Size: 21.1 nm (ref >20.5)
LDL-C: 58 mg/dL (ref 0–99)
Small LDL Particle Number: 269 nmol/L (ref <=527)
Triglycerides by NMR: 89 mg/dL (ref 0–149)

## 2015-06-24 LAB — CBC WITH DIFFERENTIAL/PLATELET
BASOS ABS: 0 10*3/uL (ref 0.0–0.2)
Basos: 0 %
EOS (ABSOLUTE): 0.1 10*3/uL (ref 0.0–0.4)
EOS: 1 %
HEMATOCRIT: 36.1 % — AB (ref 37.5–51.0)
HEMOGLOBIN: 11.9 g/dL — AB (ref 12.6–17.7)
IMMATURE GRANS (ABS): 0 10*3/uL (ref 0.0–0.1)
Immature Granulocytes: 0 %
LYMPHS ABS: 1.6 10*3/uL (ref 0.7–3.1)
LYMPHS: 27 %
MCH: 31.6 pg (ref 26.6–33.0)
MCHC: 33 g/dL (ref 31.5–35.7)
MCV: 96 fL (ref 79–97)
MONOCYTES: 12 %
Monocytes Absolute: 0.7 10*3/uL (ref 0.1–0.9)
Neutrophils Absolute: 3.4 10*3/uL (ref 1.4–7.0)
Neutrophils: 60 %
Platelets: 301 10*3/uL (ref 150–379)
RBC: 3.77 x10E6/uL — AB (ref 4.14–5.80)
RDW: 15.8 % — ABNORMAL HIGH (ref 12.3–15.4)
WBC: 5.7 10*3/uL (ref 3.4–10.8)

## 2015-06-24 LAB — BMP8+EGFR
BUN / CREAT RATIO: 12 (ref 10–22)
BUN: 13 mg/dL (ref 8–27)
CALCIUM: 9.1 mg/dL (ref 8.6–10.2)
CHLORIDE: 99 mmol/L (ref 97–106)
CO2: 28 mmol/L (ref 18–29)
CREATININE: 1.08 mg/dL (ref 0.76–1.27)
GFR calc non Af Amer: 70 mL/min/{1.73_m2} (ref >59)
GFR, EST AFRICAN AMERICAN: 81 mL/min/{1.73_m2} (ref >59)
GLUCOSE: 87 mg/dL (ref 65–99)
Potassium: 4.1 mmol/L (ref 3.5–5.2)
Sodium: 141 mmol/L (ref 136–144)

## 2015-06-24 LAB — THYROID PANEL WITH TSH
FREE THYROXINE INDEX: 2.1 (ref 1.2–4.9)
T3 UPTAKE RATIO: 29 % (ref 24–39)
T4 TOTAL: 7.1 ug/dL (ref 4.5–12.0)
TSH: 2.89 u[IU]/mL (ref 0.450–4.500)

## 2015-06-24 LAB — VITAMIN D 25 HYDROXY (VIT D DEFICIENCY, FRACTURES): Vit D, 25-Hydroxy: 46.9 ng/mL (ref 30.0–100.0)

## 2015-06-27 ENCOUNTER — Telehealth: Payer: Self-pay | Admitting: Family Medicine

## 2015-06-30 NOTE — Progress Notes (Signed)
Patient aware of lab results, faxed labs to Dr. Amil Amen and Dr. Merlene Laughter

## 2015-09-03 ENCOUNTER — Other Ambulatory Visit: Payer: Self-pay | Admitting: Family Medicine

## 2015-10-01 ENCOUNTER — Other Ambulatory Visit: Payer: Self-pay | Admitting: Family Medicine

## 2015-11-06 ENCOUNTER — Encounter: Payer: Self-pay | Admitting: Family Medicine

## 2015-11-06 ENCOUNTER — Ambulatory Visit (INDEPENDENT_AMBULATORY_CARE_PROVIDER_SITE_OTHER): Payer: Medicare Other | Admitting: Family Medicine

## 2015-11-06 VITALS — BP 99/56 | HR 66 | Temp 98.2°F | Ht 64.0 in | Wt 151.0 lb

## 2015-11-06 DIAGNOSIS — G40909 Epilepsy, unspecified, not intractable, without status epilepticus: Secondary | ICD-10-CM | POA: Insufficient documentation

## 2015-11-06 DIAGNOSIS — E559 Vitamin D deficiency, unspecified: Secondary | ICD-10-CM | POA: Diagnosis not present

## 2015-11-06 DIAGNOSIS — E785 Hyperlipidemia, unspecified: Secondary | ICD-10-CM | POA: Diagnosis not present

## 2015-11-06 DIAGNOSIS — S39012A Strain of muscle, fascia and tendon of lower back, initial encounter: Secondary | ICD-10-CM | POA: Diagnosis not present

## 2015-11-06 DIAGNOSIS — E039 Hypothyroidism, unspecified: Secondary | ICD-10-CM

## 2015-11-06 DIAGNOSIS — N400 Enlarged prostate without lower urinary tract symptoms: Secondary | ICD-10-CM

## 2015-11-06 DIAGNOSIS — G40309 Generalized idiopathic epilepsy and epileptic syndromes, not intractable, without status epilepticus: Secondary | ICD-10-CM

## 2015-11-06 DIAGNOSIS — M069 Rheumatoid arthritis, unspecified: Secondary | ICD-10-CM | POA: Diagnosis not present

## 2015-11-06 NOTE — Progress Notes (Signed)
Subjective:    Patient ID: Troy Rodgers., male    DOB: May 08, 1946, 70 y.o.   MRN: [id number]  HPI Pt here for follow up and management of chronic medical problems which includes hypothyroid, seizures, and hyperlipidemia. He is taking medications regularly.The patient today complains of some right-sided low back pain. He is due to get lab work today. This patient is followed regularly by the rheumatologist and the neurologist because of his seizure disorder. By the rheumatologist for his rheumatoid arthritis. He sees the specialist about every 6 months. He denies any seizure activity. His right-sided low back pain occurred after he was moving some plants and flowers around the house. He does not radiate down his leg. He denies any chest pain or chest tightness or shortness of breath anymore than usual. He has no problems with his GI tract and no problems with heartburn indigestion nausea vomiting diarrhea blood in the stool or black tarry bowel movements. He is passing his water without problems. He is up-to-date on his eye exams. He is alert and seems to be calm and relaxed today.      Patient Active Problem List   Diagnosis Date Noted  . Hypothyroidism 05/04/2014  . Solitary pulmonary nodule RLL lateral basal segment  10/12/2013  . Rheumatoid arthritis ([place]) 08/19/2013  . High risk medication use 08/19/2013  . BPH (benign prostatic hyperplasia) 08/19/2013  . Seizure disorder ([place]) 05/06/2013  . Acute gangrenous cholecystitis 04/28/2013  . Abnormal EKG 04/22/2012  . Hyperlipidemia   . Depressive disorder, not elsewhere classified   . Localization-related (focal) (partial) epilepsy and epileptic syndromes with complex partial seizures, without mention of intractable epilepsy   . Kidney stone   . Impotence of organic origin   . External hemorrhoid   . Nasal septal perforation   . Cataract   . Hyperplasia of prostate    Outpatient Encounter Prescriptions as of 11/06/2015  Medication  Sig  . aspirin 81 MG EC tablet Take 81 mg by mouth daily.    Marland Kitchen atorvastatin (LIPITOR) 40 MG tablet TAKE 1 TABLET BY MOUTH EVERY DAY AS DIRECTED  . Cholecalciferol (VITAMIN D3) 2000 UNITS TABS Take 1 tablet by mouth daily.    Marland Kitchen escitalopram (LEXAPRO) 20 MG tablet TAKE 1 TABLET DAILY  . folic acid (FOLVITE) 1 MG tablet Take 1 mg by mouth 2 (two) times daily.   Marland Kitchen lacosamide (VIMPAT) 200 MG TABS tablet Take 200 mg by mouth 2 (two) times daily.  Marland Kitchen levothyroxine (SYNTHROID, LEVOTHROID) 50 MCG tablet TAKE ONE TABLET BY MOUTH EVERY DAY  . methotrexate (RHEUMATREX) 2.5 MG tablet Take 10 mg by mouth once a week.   . omega-3 fish oil (MAXEPA) 1000 MG CAPS capsule Take 2 capsules by mouth daily.  Marland Kitchen omeprazole (PRILOSEC) 20 MG capsule Take 20 mg by mouth daily.   . primidone (MYSOLINE) 250 MG tablet 1 in the am and 1 1/2 at bedtime  . ZETIA 10 MG tablet TAKE ONE TABLET BY MOUTH DAILY FOR HYPERLIPIDERMEA   No facility-administered encounter medications on file as of 11/06/2015.      Review of Systems  Constitutional: Negative.   HENT: Negative.   Eyes: Negative.   Respiratory: Negative.   Cardiovascular: Negative.   Gastrointestinal: Negative.   Endocrine: Negative.   Genitourinary: Negative.   Musculoskeletal: Positive for back pain (right side - low back pain).  Skin: Negative.   Allergic/Immunologic: Negative.   Neurological: Negative.   Hematological: Negative.   Psychiatric/Behavioral: Negative.  Objective:   Physical Exam  Constitutional: He is oriented to person, place, and time. He appears well-developed and well-nourished. No distress.  Pleasant and alert  HENT:  Head: Normocephalic and atraumatic.  Right Ear: External ear normal.  Left Ear: External ear normal.  Nose: Nose normal.  Mouth/Throat: Oropharynx is clear and moist. No oropharyngeal exudate.  Eyes: Conjunctivae and EOM are normal. Pupils are equal, round, and reactive to light. Right eye exhibits no discharge.  Left eye exhibits no discharge. No scleral icterus.  Neck: Normal range of motion. Neck supple. No thyromegaly present.  Without bruits thyromegaly or anterior cervical adenopathy  Cardiovascular: Normal rate, regular rhythm, normal heart sounds and intact distal pulses.   No murmur heard. The heart has a regular rate and rhythm at 72/m  Pulmonary/Chest: Effort normal and breath sounds normal. No respiratory distress. He has no wheezes. He has no rales. He exhibits no tenderness.  Clear anteriorly and posteriorly  Abdominal: Soft. Bowel sounds are normal. He exhibits no mass. There is no tenderness. There is no rebound and no guarding.  No abdominal tenderness or liver or spleen enlargement. No suprapubic tenderness and no inguinal adenopathy  Musculoskeletal: Normal range of motion. He exhibits no edema or tenderness.  Lymphadenopathy:    He has no cervical adenopathy.  Neurological: He is alert and oriented to person, place, and time. He has normal reflexes. No cranial nerve deficit.  Skin: Skin is warm and dry. No rash noted.  Psychiatric: He has a normal mood and affect. His behavior is normal. Judgment and thought content normal.  Nursing note and vitals reviewed.  BP 99/56 mmHg  Pulse 66  Temp(Src) 98.2 F (36.8 C) (Oral)  Ht _0  (1.626 m)  Wt 151 lb (68.493 kg)  BMI 25.91 kg/m2        Assessment & Plan:  1. Hyperlipidemia -Continue current treatment pending results of lab work - BMP8+EGFR - CBC with Differential/Platelet - Hepatic function panel - NMR, lipoprofile  2. Vitamin D deficiency -Continue current treatment pending results of lab work - CBC with Differential/Platelet - VITAMIN D 25 Hydroxy (Vit-D Deficiency, Fractures)  3. BPH (benign prostatic hyperplasia) - CBC with Differential/Platelet  4. Seizure disorder ([place]) -Continue to follow-up with neurology - CBC with Differential/Platelet  5. Hypothyroidism, unspecified hypothyroidism type - CBC with  Differential/Platelet - Thyroid Panel With TSH  6. Rheumatoid arthritis involving multiple sites, unspecified rheumatoid factor presence ([place]) -Continue to follow-up with rheumatology  7. Nonintractable generalized idiopathic epilepsy without status epilepticus ([place]) -The patient continues to be followed by neurology and he has had no seizures.  8. Low back strain, initial encounter -He will take Aleve twice daily for 10-14 days after meals and will come back to the office in a couple weeks for x-rays of the back if he is not better. -He will also use warm wet compresses to the back 20 minutes 3 or 4 times daily  9. Allergic rhinitis -Continue with Flonase, nasal saline and add Claritin 1 daily if needed for additional help with allergies.  Patient Instructions                       Medicare Annual Wellness Visit  [facility] and the medical providers at [facility] strive to bring you the best medical care.  In doing so we not only want to address your current medical conditions and concerns but also to detect new conditions early and prevent illness, disease  and health-related problems.    Medicare offers a yearly Wellness Visit which allows our clinical staff to assess your need for preventative services including immunizations, lifestyle education, counseling to decrease risk of preventable diseases and screening for fall risk and other medical concerns.    This visit is provided free of charge (no copay) for all Medicare recipients. The clinical pharmacists at [facility] have begun to conduct these Wellness Visits which will also include a thorough review of all your medications.    As you primary medical provider recommend that you make an appointment for your Annual Wellness Visit if you have not done so already this year.  You may set up this appointment before you leave today or you may call back ([phone]) and schedule an appointment.   Please make sure when you call that you mention that you are scheduling your Annual Wellness Visit with the clinical pharmacist so that the appointment may be made for the proper length of time.     Continue current medications. Continue good therapeutic lifestyle changes which include good diet and exercise. Fall precautions discussed with patient. If an FOBT was given today- please return it to our front desk. If you are over 24 years old - you may need Prevnar 52 or the adult Pneumonia vaccine.  **Flu shots are available--- please call and schedule a FLU-CLINIC appointment**  After your visit with [place] today you will receive a survey in the mail or online from [employer] regarding your care with [place]. Please take a moment to fill this out. Your feedback is very important to [place] as you can help [place] better understand your patient needs as well as improve your experience and satisfaction. WE CARE ABOUT YOU!!!   Continue to follow-up with neurology and rheumatology Use warm wet compresses to the back 20 minutes 3 or 4 times daily Take Aleve 1 twice daily after breakfast and supper and do this for 10-14 days or until the back pain is improved If the back pain persists these come back for LS spine films but I believe it will be better if you take the Aleve regularly and use the moist heat.   Arrie Senate MD

## 2015-11-06 NOTE — Patient Instructions (Addendum)
Medicare Annual Wellness Visit  [facility] and the medical providers at [facility] strive to bring you the best medical care.  In doing so we not only want to address your current medical conditions and concerns but also to detect new conditions early and prevent illness, disease and health-related problems.    Medicare offers a yearly Wellness Visit which allows our clinical staff to assess your need for preventative services including immunizations, lifestyle education, counseling to decrease risk of preventable diseases and screening for fall risk and other medical concerns.    This visit is provided free of charge (no copay) for all Medicare recipients. The clinical pharmacists at [facility] have begun to conduct these Wellness Visits which will also include a thorough review of all your medications.    As you primary medical provider recommend that you make an appointment for your Annual Wellness Visit if you have not done so already this year.  You may set up this appointment before you leave today or you may call back [phone] and schedule an appointment.  Please make sure when you call that you mention that you are scheduling your Annual Wellness Visit with the clinical pharmacist so that the appointment may be made for the proper length of time.     Continue current medications. Continue good therapeutic lifestyle changes which include good diet and exercise. Fall precautions discussed with patient. If an FOBT was given today- please return it to our front desk. If you are over 26 years old - you may need Prevnar 12 or the adult Pneumonia vaccine.  **Flu shots are available--- please call and schedule a FLU-CLINIC appointment**  After your visit with [place] today you will receive a survey in the mail or online from [employer] regarding your care with [place]. Please take a moment to fill this out. Your feedback is very  important to [place] as you can help [place] better understand your patient needs as well as improve your experience and satisfaction. WE CARE ABOUT YOU!!!   Continue to follow-up with neurology and rheumatology Use warm wet compresses to the back 20 minutes 3 or 4 times daily Take Aleve 1 twice daily after breakfast and supper and do this for 10-14 days or until the back pain is improved If the back pain persists these come back for LS spine films but I believe it will be better if you take the Aleve regularly and use the moist heat.

## 2015-11-07 LAB — CBC WITH DIFFERENTIAL/PLATELET
BASOS ABS: 0 10*3/uL (ref 0.0–0.2)
BASOS: 0 %
EOS (ABSOLUTE): 0.1 10*3/uL (ref 0.0–0.4)
Eos: 2 %
HEMATOCRIT: 35.8 % — AB (ref 37.5–51.0)
HEMOGLOBIN: 11.7 g/dL — AB (ref 12.6–17.7)
Immature Grans (Abs): 0 10*3/uL (ref 0.0–0.1)
Immature Granulocytes: 0 %
LYMPHS ABS: 1.6 10*3/uL (ref 0.7–3.1)
Lymphs: 24 %
MCH: 31.6 pg (ref 26.6–33.0)
MCHC: 32.7 g/dL (ref 31.5–35.7)
MCV: 97 fL (ref 79–97)
Monocytes Absolute: 0.9 10*3/uL (ref 0.1–0.9)
Monocytes: 14 %
NEUTROS ABS: 4.1 10*3/uL (ref 1.4–7.0)
Neutrophils: 60 %
Platelets: 304 10*3/uL (ref 150–379)
RBC: 3.7 x10E6/uL — ABNORMAL LOW (ref 4.14–5.80)
RDW: 15.3 % (ref 12.3–15.4)
WBC: 6.7 10*3/uL (ref 3.4–10.8)

## 2015-11-07 LAB — NMR, LIPOPROFILE
Cholesterol: 133 mg/dL (ref 100–199)
HDL Cholesterol by NMR: 59 mg/dL (ref >39)
HDL PARTICLE NUMBER: 32.3 umol/L (ref >=30.5)
LDL PARTICLE NUMBER: 754 nmol/L (ref <1000)
LDL Size: 20.9 nm (ref >20.5)
LDL-C: 56 mg/dL (ref 0–99)
LP-IR SCORE: 25 (ref <=45)
Small LDL Particle Number: 200 nmol/L (ref <=527)
Triglycerides by NMR: 88 mg/dL (ref 0–149)

## 2015-11-07 LAB — BMP8+EGFR
BUN / CREAT RATIO: 15 (ref 10–24)
BUN: 17 mg/dL (ref 8–27)
CO2: 24 mmol/L (ref 18–29)
Calcium: 8.9 mg/dL (ref 8.6–10.2)
Chloride: 100 mmol/L (ref 96–106)
Creatinine, Ser: 1.13 mg/dL (ref 0.76–1.27)
GFR calc non Af Amer: 65 mL/min/{1.73_m2} (ref >59)
GFR, EST AFRICAN AMERICAN: 76 mL/min/{1.73_m2} (ref >59)
Glucose: 90 mg/dL (ref 65–99)
POTASSIUM: 4.5 mmol/L (ref 3.5–5.2)
Sodium: 140 mmol/L (ref 134–144)

## 2015-11-07 LAB — THYROID PANEL WITH TSH
FREE THYROXINE INDEX: 2 (ref 1.2–4.9)
T3 UPTAKE RATIO: 29 % (ref 24–39)
T4 TOTAL: 7 ug/dL (ref 4.5–12.0)
TSH: 3.13 u[IU]/mL (ref 0.450–4.500)

## 2015-11-07 LAB — HEPATIC FUNCTION PANEL
ALK PHOS: 80 IU/L (ref 39–117)
ALT: 31 IU/L (ref 0–44)
AST: 32 IU/L (ref 0–40)
Albumin: 4.3 g/dL (ref 3.5–4.8)
Bilirubin Total: <0.2 mg/dL (ref 0.0–1.2)
Bilirubin, Direct: 0.06 mg/dL (ref 0.00–0.40)
Total Protein: 7 g/dL (ref 6.0–8.5)

## 2015-11-07 LAB — VITAMIN D 25 HYDROXY (VIT D DEFICIENCY, FRACTURES): Vit D, 25-Hydroxy: 55.5 ng/mL (ref 30.0–100.0)

## 2015-11-08 LAB — SPECIMEN STATUS REPORT

## 2015-11-08 LAB — VITAMIN B12: VITAMIN B 12: 387 pg/mL (ref 211–946)

## 2015-11-08 LAB — IRON: IRON: 45 ug/dL (ref 38–169)

## 2015-11-08 LAB — FERRITIN: Ferritin: 14 ng/mL — ABNORMAL LOW (ref 30–400)

## 2015-11-09 ENCOUNTER — Other Ambulatory Visit: Payer: Medicare Other

## 2015-11-09 DIAGNOSIS — Z1212 Encounter for screening for malignant neoplasm of rectum: Secondary | ICD-10-CM

## 2015-11-15 LAB — FECAL OCCULT BLOOD, IMMUNOCHEMICAL: FECAL OCCULT BLD: NEGATIVE

## 2015-11-30 ENCOUNTER — Other Ambulatory Visit: Payer: Self-pay | Admitting: Family Medicine

## 2015-12-14 ENCOUNTER — Other Ambulatory Visit: Payer: Self-pay | Admitting: Family Medicine

## 2016-03-03 ENCOUNTER — Other Ambulatory Visit: Payer: Self-pay | Admitting: Family Medicine

## 2016-03-26 ENCOUNTER — Encounter: Payer: Self-pay | Admitting: Family Medicine

## 2016-03-26 ENCOUNTER — Ambulatory Visit (INDEPENDENT_AMBULATORY_CARE_PROVIDER_SITE_OTHER): Payer: Medicare Other | Admitting: Family Medicine

## 2016-03-26 VITALS — BP 110/65 | HR 73 | Temp 97.6°F | Ht 64.0 in | Wt 151.0 lb

## 2016-03-26 DIAGNOSIS — Z0000 Encounter for general adult medical examination without abnormal findings: Secondary | ICD-10-CM

## 2016-03-26 DIAGNOSIS — E785 Hyperlipidemia, unspecified: Secondary | ICD-10-CM

## 2016-03-26 DIAGNOSIS — E039 Hypothyroidism, unspecified: Secondary | ICD-10-CM

## 2016-03-26 DIAGNOSIS — E559 Vitamin D deficiency, unspecified: Secondary | ICD-10-CM

## 2016-03-26 DIAGNOSIS — M069 Rheumatoid arthritis, unspecified: Secondary | ICD-10-CM

## 2016-03-26 DIAGNOSIS — N400 Enlarged prostate without lower urinary tract symptoms: Secondary | ICD-10-CM

## 2016-03-26 DIAGNOSIS — G40909 Epilepsy, unspecified, not intractable, without status epilepticus: Secondary | ICD-10-CM

## 2016-03-26 LAB — URINALYSIS, COMPLETE
BILIRUBIN UA: NEGATIVE
Glucose, UA: NEGATIVE
KETONES UA: NEGATIVE
LEUKOCYTES UA: NEGATIVE
Nitrite, UA: NEGATIVE
PH UA: 6 (ref 5.0–7.5)
PROTEIN UA: NEGATIVE
SPEC GRAV UA: 1.015 (ref 1.005–1.030)
UUROB: 0.2 mg/dL (ref 0.2–1.0)

## 2016-03-26 LAB — MICROSCOPIC EXAMINATION
Bacteria, UA: NONE SEEN
Epithelial Cells (non renal): NONE SEEN /hpf (ref 0–10)
WBC UA: NONE SEEN /HPF (ref 0–?)

## 2016-03-26 NOTE — Progress Notes (Signed)
Subjective:    Patient ID: Troy Genre., male    DOB: [birth year], 70 y.o.   MRN: [id number]  HPI Patient is here today for annual wellness exam and follow up of chronic medical problem which includes hypothyroid, seizure disorder and hyperlipidemia. He is taking medications regularly.The patient is doing well overall. He has a history of epilepsy hyperlipidemia rheumatoid arthritis and depression. He sees the neurologist and the rheumatologist on a regular basis. The patient denies any chest pain or shortness of breath. He has no trouble with his gastrointestinal tract including swallowing issues nausea vomiting diarrhea blood in the stool or black tarry bowel movements. He is passing his water without problems. He is up-to-date on his eye exams. He denies any seizure activity and sees the neurologist about every 4 months. He also continues to see the rheumatologist. The patient stays active and walks daily without any problems.     Patient Active Problem List   Diagnosis Date Noted  . Epilepsy ([place]) 11/06/2015  . Hypothyroidism 05/04/2014  . Solitary pulmonary nodule RLL lateral basal segment  10/12/2013  . Rheumatoid arthritis ([place]) 08/19/2013  . High risk medication use 08/19/2013  . BPH (benign prostatic hyperplasia) 08/19/2013  . Seizure disorder ([place]) 05/06/2013  . Acute gangrenous cholecystitis 04/28/2013  . Abnormal EKG 04/22/2012  . Hyperlipidemia   . Depressive disorder, not elsewhere classified   . Localization-related (focal) (partial) epilepsy and epileptic syndromes with complex partial seizures, without mention of intractable epilepsy   . Kidney stone   . Impotence of organic origin   . External hemorrhoid   . Nasal septal perforation   . Cataract   . Hyperplasia of prostate    Outpatient Encounter Prescriptions as of 03/26/2016  Medication Sig  . aspirin 81 MG EC tablet Take 81 mg by mouth daily.    Marland Kitchen atorvastatin (LIPITOR) 40 MG tablet TAKE 1 TABLET BY MOUTH  EVERY DAY AS DIRECTED  . Cholecalciferol (VITAMIN D3) 2000 UNITS TABS Take 1 tablet by mouth daily.    Marland Kitchen escitalopram (LEXAPRO) 20 MG tablet TAKE 1 TABLET DAILY  . ezetimibe (ZETIA) 10 MG tablet TAKE ONE TABLET BY MOUTH DAILY FOR HYPERLIPIDERMEA  . folic acid (FOLVITE) 1 MG tablet Take 1 mg by mouth 2 (two) times daily.   Marland Kitchen lacosamide (VIMPAT) 200 MG TABS tablet Take 200 mg by mouth 2 (two) times daily.  Marland Kitchen levothyroxine (SYNTHROID, LEVOTHROID) 50 MCG tablet TAKE ONE TABLET BY MOUTH EVERY DAY  . methotrexate (RHEUMATREX) 2.5 MG tablet Take 10 mg by mouth once a week.   . omega-3 fish oil (MAXEPA) 1000 MG CAPS capsule Take 2 capsules by mouth daily.  Marland Kitchen omeprazole (PRILOSEC) 20 MG capsule Take 20 mg by mouth daily.   . primidone (MYSOLINE) 250 MG tablet 1 in the am and 1 1/2 at bedtime   No facility-administered encounter medications on file as of 03/26/2016.       Review of Systems  Constitutional: Negative.   HENT: Negative.   Eyes: Negative.   Respiratory: Negative.   Cardiovascular: Negative.   Gastrointestinal: Negative.   Endocrine: Negative.   Genitourinary: Negative.   Musculoskeletal: Negative.   Skin: Negative.   Allergic/Immunologic: Negative.   Neurological: Negative.   Hematological: Negative.   Psychiatric/Behavioral: Negative.        Objective:   Physical Exam  Constitutional: He is oriented to person, place, and time. He appears well-developed and well-nourished. No distress.  HENT:  Head: Normocephalic and atraumatic.  Right  Ear: External ear normal.  Left Ear: External ear normal.  Nose: Nose normal.  Mouth/Throat: Oropharynx is clear and moist. No oropharyngeal exudate.  The patient wears bilateral hearing aids in both ear canals are clear.  Eyes: Conjunctivae and EOM are normal. Pupils are equal, round, and reactive to light. Right eye exhibits no discharge. Left eye exhibits no discharge. No scleral icterus.  Neck: Normal range of motion. Neck supple. No  thyromegaly present.  No bruits thyromegaly or anterior cervical adenopathy  Cardiovascular: Normal rate, regular rhythm, normal heart sounds and intact distal pulses.   No murmur heard. The heart has a regular rate and rhythm at 72/m  Pulmonary/Chest: Effort normal and breath sounds normal. No respiratory distress. He has no wheezes. He has no rales. He exhibits no tenderness.  Clear anteriorly and posteriorly no axillary adenopathy  Abdominal: Soft. Bowel sounds are normal. He exhibits no mass. There is no tenderness. There is no rebound and no guarding.  There is no liver or spleen enlargement there is a lot of intestinal gas. There is no inguinal adenopathy.  Genitourinary: Rectum normal and penis normal.  Genitourinary Comments: The prostate is minimally enlarged and smooth without lumps or masses. The rectal exam is clear of masses. There is no inguinal hernias present external genitalia are within normal limits.  Musculoskeletal: Normal range of motion. He exhibits no edema.  The patient has a somewhat kyphotic posture.  Lymphadenopathy:    He has no cervical adenopathy.  Neurological: He is alert and oriented to person, place, and time. He has normal reflexes. No cranial nerve deficit.  Skin: Skin is warm and dry. No rash noted.  Psychiatric: He has a normal mood and affect. His behavior is normal. Judgment and thought content normal.  Nursing note and vitals reviewed.  BP 110/65 (BP Location: Left Arm)   Pulse 73   Temp 97.6 F (36.4 C) (Oral)   Ht '5\' 4"'  (1.626 m)   Wt 151 lb (68.5 kg)   BMI 25.92 kg/m         Assessment & Plan:  1. Annual physical exam -Patient will continue to follow-up with his neurologist and rheumatologist. - Urinalysis, Complete - BMP8+EGFR - CBC with Differential/Platelet - Hepatic function panel - NMR, lipoprofile - Thyroid Panel With TSH - VITAMIN D 25 Hydroxy (Vit-D Deficiency, Fractures) - PSA, total and free  2. Hypothyroidism,  unspecified hypothyroidism type -Continue with current treatment pending results of lab work - CBC with Differential/Platelet - Thyroid Panel With TSH  3. Hyperlipidemia -Continue with current treatment pending results of lab work - BMP8+EGFR - CBC with Differential/Platelet - Hepatic function panel - NMR, lipoprofile  4. Vitamin D deficiency -Continue with current treatment pending results of lab work - CBC with Differential/Platelet - VITAMIN D 25 Hydroxy (Vit-D Deficiency, Fractures)  5. Seizure disorder ([place]) -Continue to follow-up with neurology - CBC with Differential/Platelet  6. BPH (benign prostatic hyperplasia) -The prostate is only minimally enlarged and he's having no symptoms with his BPH. - Urinalysis, Complete - CBC with Differential/Platelet - PSA, total and free  7. Rheumatoid arthritis involving multiple sites, unspecified rheumatoid factor presence ([place]) -He is doing well with his joint issues and rheumatoid arthritis and continues to follow-up with his rheumatologist every 4-6 months. - CBC with Differential/Platelet  Patient Instructions                       Medicare Annual Wellness Visit  [facility] and the medical  providers at [facility] strive to bring you the best medical care.  In doing so we not only want to address your current medical conditions and concerns but also to detect new conditions early and prevent illness, disease and health-related problems.    Medicare offers a yearly Wellness Visit which allows our clinical staff to assess your need for preventative services including immunizations, lifestyle education, counseling to decrease risk of preventable diseases and screening for fall risk and other medical concerns.    This visit is provided free of charge (no copay) for all Medicare recipients. The clinical pharmacists at [facility] have begun to conduct these Wellness Visits which will also  include a thorough review of all your medications.    As you primary medical provider recommend that you make an appointment for your Annual Wellness Visit if you have not done so already this year.  You may set up this appointment before you leave today or you may call back [phone]) and schedule an appointment.  Please make sure when you call that you mention that you are scheduling your Annual Wellness Visit with the clinical pharmacist so that the appointment may be made for the proper length of time.      Continue current medications. Continue good therapeutic lifestyle changes which include good diet and exercise. Fall precautions discussed with patient. If an FOBT was given today- please return it to our front desk. If you are over 16 years old - you may need Prevnar 72 or the adult Pneumonia vaccine.  **Flu shots are available--- please call and schedule a FLU-CLINIC appointment**  After your visit with [place] today you will receive a survey in the mail or online from [employer] regarding your care with [place]. Please take a moment to fill this out. Your feedback is very important to [place] as you can help [place] better understand your patient needs as well as improve your experience and satisfaction. WE CARE ABOUT YOU!!!  Continue to follow-up with rheumatology and neurology Don't forget to get your flu shot in October Stay active physically and exercise regularly Continue with current medicines pending results of lab work     Arrie Senate MD   .

## 2016-03-26 NOTE — Patient Instructions (Addendum)
Medicare Annual Wellness Visit  [facility] and the medical providers at [facility] strive to bring you the best medical care.  In doing so we not only want to address your current medical conditions and concerns but also to detect new conditions early and prevent illness, disease and health-related problems.    Medicare offers a yearly Wellness Visit which allows our clinical staff to assess your need for preventative services including immunizations, lifestyle education, counseling to decrease risk of preventable diseases and screening for fall risk and other medical concerns.    This visit is provided free of charge (no copay) for all Medicare recipients. The clinical pharmacists at [facility] have begun to conduct these Wellness Visits which will also include a thorough review of all your medications.    As you primary medical provider recommend that you make an appointment for your Annual Wellness Visit if you have not done so already this year.  You may set up this appointment before you leave today or you may call back [phone] and schedule an appointment.  Please make sure when you call that you mention that you are scheduling your Annual Wellness Visit with the clinical pharmacist so that the appointment may be made for the proper length of time.      Continue current medications. Continue good therapeutic lifestyle changes which include good diet and exercise. Fall precautions discussed with patient. If an FOBT was given today- please return it to our front desk. If you are over 64 years old - you may need Prevnar 18 or the adult Pneumonia vaccine.  **Flu shots are available--- please call and schedule a FLU-CLINIC appointment**  After your visit with [place] today you will receive a survey in the mail or online from [employer] regarding your care with [place]. Please take a moment to fill this out. Your feedback is very  important to [place] as you can help [place] better understand your patient needs as well as improve your experience and satisfaction. WE CARE ABOUT YOU!!!  Continue to follow-up with rheumatology and neurology Don't forget to get your flu shot in October Stay active physically and exercise regularly Continue with current medicines pending results of lab work

## 2016-03-27 LAB — BMP8+EGFR
BUN / CREAT RATIO: 12 (ref 10–24)
BUN: 13 mg/dL (ref 8–27)
CHLORIDE: 95 mmol/L — AB (ref 96–106)
CO2: 28 mmol/L (ref 18–29)
Calcium: 9.5 mg/dL (ref 8.6–10.2)
Creatinine, Ser: 1.1 mg/dL (ref 0.76–1.27)
GFR calc non Af Amer: 68 mL/min/{1.73_m2} (ref >59)
GFR, EST AFRICAN AMERICAN: 78 mL/min/{1.73_m2} (ref >59)
GLUCOSE: 98 mg/dL (ref 65–99)
POTASSIUM: 4.3 mmol/L (ref 3.5–5.2)
Sodium: 139 mmol/L (ref 134–144)

## 2016-03-27 LAB — NMR, LIPOPROFILE
Cholesterol: 147 mg/dL (ref 100–199)
HDL Cholesterol by NMR: 59 mg/dL (ref >39)
HDL Particle Number: 30.1 umol/L — ABNORMAL LOW (ref >=30.5)
LDL PARTICLE NUMBER: 766 nmol/L (ref <1000)
LDL SIZE: 20.6 nm (ref >20.5)
LDL-C: 64 mg/dL (ref 0–99)
LP-IR SCORE: 31 (ref <=45)
Small LDL Particle Number: 294 nmol/L (ref <=527)
Triglycerides by NMR: 122 mg/dL (ref 0–149)

## 2016-03-27 LAB — CBC WITH DIFFERENTIAL/PLATELET
BASOS ABS: 0 10*3/uL (ref 0.0–0.2)
Basos: 1 %
EOS (ABSOLUTE): 0 10*3/uL (ref 0.0–0.4)
Eos: 0 %
Hematocrit: 39.3 % (ref 37.5–51.0)
Hemoglobin: 12.8 g/dL (ref 12.6–17.7)
Immature Grans (Abs): 0 10*3/uL (ref 0.0–0.1)
Immature Granulocytes: 0 %
LYMPHS ABS: 1.2 10*3/uL (ref 0.7–3.1)
Lymphs: 20 %
MCH: 31.8 pg (ref 26.6–33.0)
MCHC: 32.6 g/dL (ref 31.5–35.7)
MCV: 98 fL — ABNORMAL HIGH (ref 79–97)
MONOS ABS: 0.9 10*3/uL (ref 0.1–0.9)
Monocytes: 16 %
NEUTROS ABS: 3.7 10*3/uL (ref 1.4–7.0)
Neutrophils: 63 %
PLATELETS: 322 10*3/uL (ref 150–379)
RBC: 4.02 x10E6/uL — AB (ref 4.14–5.80)
RDW: 15.9 % — AB (ref 12.3–15.4)
WBC: 5.8 10*3/uL (ref 3.4–10.8)

## 2016-03-27 LAB — PSA, TOTAL AND FREE
PROSTATE SPECIFIC AG, SERUM: 0.4 ng/mL (ref 0.0–4.0)
PSA FREE PCT: 65 %
PSA FREE: 0.26 ng/mL

## 2016-03-27 LAB — HEPATIC FUNCTION PANEL
ALK PHOS: 97 IU/L (ref 39–117)
ALT: 24 IU/L (ref 0–44)
AST: 36 IU/L (ref 0–40)
Albumin: 4.5 g/dL (ref 3.5–4.8)
Bilirubin Total: 0.3 mg/dL (ref 0.0–1.2)
Bilirubin, Direct: 0.07 mg/dL (ref 0.00–0.40)
Total Protein: 7.6 g/dL (ref 6.0–8.5)

## 2016-03-27 LAB — THYROID PANEL WITH TSH
Free Thyroxine Index: 1.9 (ref 1.2–4.9)
T3 Uptake Ratio: 25 % (ref 24–39)
T4, Total: 7.4 ug/dL (ref 4.5–12.0)
TSH: 2.86 u[IU]/mL (ref 0.450–4.500)

## 2016-03-27 LAB — VITAMIN D 25 HYDROXY (VIT D DEFICIENCY, FRACTURES): VIT D 25 HYDROXY: 50.7 ng/mL (ref 30.0–100.0)

## 2016-03-29 ENCOUNTER — Other Ambulatory Visit: Payer: Self-pay | Admitting: *Deleted

## 2016-03-29 MED ORDER — LEVOTHYROXINE SODIUM 50 MCG PO TABS: 50.0000 ug | ORAL_TABLET | Freq: Every day | ORAL | 3 refills | Status: DC

## 2016-05-24 ENCOUNTER — Other Ambulatory Visit: Payer: Self-pay | Admitting: Family Medicine

## 2016-06-27 ENCOUNTER — Telehealth: Payer: Self-pay | Admitting: Family Medicine

## 2016-06-27 NOTE — Telephone Encounter (Signed)
Labs DOS 03-26-16 faxed to Dr. Marijean Bravo [phone].

## 2016-07-29 ENCOUNTER — Ambulatory Visit (INDEPENDENT_AMBULATORY_CARE_PROVIDER_SITE_OTHER): Payer: Medicare Other

## 2016-07-29 ENCOUNTER — Ambulatory Visit (INDEPENDENT_AMBULATORY_CARE_PROVIDER_SITE_OTHER): Payer: Medicare Other | Admitting: Family Medicine

## 2016-07-29 ENCOUNTER — Encounter: Payer: Self-pay | Admitting: Family Medicine

## 2016-07-29 VITALS — BP 111/63 | HR 66 | Temp 96.8°F | Ht 64.0 in | Wt 154.0 lb

## 2016-07-29 DIAGNOSIS — E7800 Pure hypercholesterolemia, unspecified: Secondary | ICD-10-CM

## 2016-07-29 DIAGNOSIS — E559 Vitamin D deficiency, unspecified: Secondary | ICD-10-CM

## 2016-07-29 DIAGNOSIS — M069 Rheumatoid arthritis, unspecified: Secondary | ICD-10-CM | POA: Diagnosis not present

## 2016-07-29 DIAGNOSIS — G40909 Epilepsy, unspecified, not intractable, without status epilepticus: Secondary | ICD-10-CM | POA: Diagnosis not present

## 2016-07-29 DIAGNOSIS — R911 Solitary pulmonary nodule: Secondary | ICD-10-CM

## 2016-07-29 NOTE — Progress Notes (Signed)
Subjective:     Patient ID: Troy Genre., male   DOB: Dec 22, 1945, 71 y.o.   MRN: [id number]  HPI Pt here for follow up and management of chronic medical problems which includes hyperlipidemia. He is taking medication regularly.The patient is doing well overall today with no complaints. He is not requesting any refills. He has a history of epilepsy or seizure disorder and is followed regularly by the neurologist. He is due to get a chest x-ray and will get lab work today and we will make sure that we send the neurologist a copy of this. The patient has no complaints. He denies chest pain shortness of breath and palpitations. He denies any trouble with his intestinal tract including nausea vomiting diarrhea blood in the stool or black tarry bowel movements. He is passing his water without problems. Use the neurologist every 6 months and has a visit coming up with him soon. The neurologist checks his drug levels for his seizure medications. The patient does have 3 sisters and they are all older. The oldest sister has congestive heart failure and dementia. The middle sister has rheumatoid arthritis like the patient. The patient does have a history of a pulmonary nodule and I'm not certain when that repeat CT scan is supposed to be done. If it is to be done we should not do a chest x-ray today.    Patient Active Problem List   Diagnosis Date Noted  . Epilepsy ([place]) 11/06/2015  . Hypothyroidism 05/04/2014  . Solitary pulmonary nodule RLL lateral basal segment  10/12/2013  . Rheumatoid arthritis ([place]) 08/19/2013  . High risk medication use 08/19/2013  . BPH (benign prostatic hyperplasia) 08/19/2013  . Seizure disorder ([place]) 05/06/2013  . Acute gangrenous cholecystitis 04/28/2013  . Abnormal EKG 04/22/2012  . Hyperlipidemia   . Depressive disorder, not elsewhere classified   . Localization-related (focal) (partial) epilepsy and epileptic syndromes with complex partial seizures, without mention of  intractable epilepsy   . Kidney stone   . Impotence of organic origin   . External hemorrhoid   . Nasal septal perforation   . Cataract   . Hyperplasia of prostate    Outpatient Encounter Prescriptions as of 07/29/2016  Medication Sig  . aspirin 81 MG EC tablet Take 81 mg by mouth daily.    Marland Kitchen atorvastatin (LIPITOR) 40 MG tablet TAKE 1 TABLET BY MOUTH EVERY DAY AS DIRECTED  . Cholecalciferol (VITAMIN D3) 2000 UNITS TABS Take 1 tablet by mouth daily.    Marland Kitchen escitalopram (LEXAPRO) 20 MG tablet TAKE 1 TABLET DAILY  . ezetimibe (ZETIA) 10 MG tablet TAKE ONE TABLET BY MOUTH DAILY FOR HYPERLIPIDERMEA  . folic acid (FOLVITE) 1 MG tablet Take 1 mg by mouth 2 (two) times daily.   Marland Kitchen lacosamide (VIMPAT) 200 MG TABS tablet Take 200 mg by mouth 2 (two) times daily.  Marland Kitchen levothyroxine (SYNTHROID, LEVOTHROID) 50 MCG tablet Take 1 tablet (50 mcg total) by mouth daily.  . methotrexate (RHEUMATREX) 2.5 MG tablet Take 10 mg by mouth once a week.   . omega-3 fish oil (MAXEPA) 1000 MG CAPS capsule Take 2 capsules by mouth daily.  Marland Kitchen omeprazole (PRILOSEC) 20 MG capsule Take 20 mg by mouth daily.   . primidone (MYSOLINE) 250 MG tablet 1 in the am and 1 1/2 at bedtime   No facility-administered encounter medications on file as of 07/29/2016.      Review of Systems  Constitutional: Negative.   HENT: Negative.   Eyes: Negative.  Respiratory: Negative.   Cardiovascular: Negative.   Gastrointestinal: Negative.   Endocrine: Negative.   Genitourinary: Negative.   Musculoskeletal: Negative.   Skin: Negative.   Allergic/Immunologic: Negative.   Neurological: Negative.   Hematological: Negative.   Psychiatric/Behavioral: Negative.        Objective:   Physical Exam  Constitutional: He is oriented to person, place, and time. He appears well-developed and well-nourished. No distress.  HENT:  Head: Normocephalic and atraumatic.  Right Ear: External ear normal.  Left Ear: External ear normal.  Nose: Nose  normal.  Mouth/Throat: Oropharynx is clear and moist. No oropharyngeal exudate.  The patient wears bilateral hearing aids  Eyes: Conjunctivae and EOM are normal. Pupils are equal, round, and reactive to light. Right eye exhibits no discharge. Left eye exhibits no discharge. No scleral icterus.  Neck: Normal range of motion. Neck supple. No thyromegaly present.  No bruits thyromegaly or anterior cervical adenopathy  Cardiovascular: Normal rate, regular rhythm, normal heart sounds and intact distal pulses.   No murmur heard. The heart has a regular rate and rhythm at 60/m  Pulmonary/Chest: Effort normal and breath sounds normal. No respiratory distress. He has no wheezes. He has no rales. He exhibits no tenderness.  No axillary adenopathy or chest wall tenderness  Abdominal: Soft. Bowel sounds are normal. He exhibits no mass. There is no tenderness. There is no rebound and no guarding.  No abdominal tenderness masses or organ enlargement or bruits  Musculoskeletal: Normal range of motion. He exhibits no edema.  The patient's rheumatoid arthritis appears to be under good control with no complaints with joints today.  Lymphadenopathy:    He has no cervical adenopathy.  Neurological: He is alert and oriented to person, place, and time. He has normal reflexes. No cranial nerve deficit.  Skin: Skin is warm and dry. No rash noted.  Psychiatric: He has a normal mood and affect. His behavior is normal. Judgment and thought content normal.  Nursing note and vitals reviewed.  BP 111/63 (BP Location: Left Arm)   Pulse 66   Temp (!) 96.8 F (36 C) (Oral)   Ht '5\' 4"'  (1.626 m)   Wt 154 lb (69.9 kg)   BMI 26.43 kg/m      Assessment:      1. Pure hypercholesterolemia -Continue with current treatment and aggressive therapeutic lifestyle changes pending results of lab work - BMP8+EGFR - CBC with Differential/Platelet - Hepatic function panel - Lipid panel - DG Chest 2 View; Future  2. Vitamin D  deficiency -Continue with current treatment pending results of lab work - CBC with Differential/Platelet - VITAMIN D 25 Hydroxy (Vit-D Deficiency, Fractures)  3. Seizure disorder [facility]) -Follow-up with neurology as planned - CBC with Differential/Platelet  4. Rheumatoid arthritis involving multiple sites, unspecified rheumatoid factor presence ([place]) -Follow-up with rheumatology as planned  5. Lung nodule -We will get a chest x-ray today and make sure that all is stable. We'll make sure that his pulmonologist gets a copy of the chest x-ray report and that no further CT scans are necessary.  Plan:     Patient Instructions                       Medicare Annual Wellness Visit  [facility] and the medical providers at [facility] strive to bring you the best medical care.  In doing so we not only want to address your current medical conditions and concerns but also to detect new  conditions early and prevent illness, disease and health-related problems.    Medicare offers a yearly Wellness Visit which allows our clinical staff to assess your need for preventative services including immunizations, lifestyle education, counseling to decrease risk of preventable diseases and screening for fall risk and other medical concerns.    This visit is provided free of charge (no copay) for all Medicare recipients. The clinical pharmacists at [facility] have begun to conduct these Wellness Visits which will also include a thorough review of all your medications.    As you primary medical provider recommend that you make an appointment for your Annual Wellness Visit if you have not done so already this year.  You may set up this appointment before you leave today or you may call back [phone]) and schedule an appointment.  Please make sure when you call that you mention that you are scheduling your Annual Wellness Visit with the clinical pharmacist so that the  appointment may be made for the proper length of time.     Continue current medications. Continue good therapeutic lifestyle changes which include good diet and exercise. Fall precautions discussed with patient. If an FOBT was given today- please return it to our front desk. If you are over 68 years old - you may need Prevnar 102 or the adult Pneumonia vaccine.  **Flu shots are available--- please call and schedule a FLU-CLINIC appointment**  After your visit with [place] today you will receive a survey in the mail or online from [employer] regarding your care with [place]. Please take a moment to fill this out. Your feedback is very important to [place] as you can help [place] better understand your patient needs as well as improve your experience and satisfaction. WE CARE ABOUT YOU!!!  We will get a chest x-ray today and make sure that the pulmonologist gets a copy of this report along with the blood work.   Arrie Senate MD

## 2016-07-29 NOTE — Patient Instructions (Addendum)
Medicare Annual Wellness Visit  [facility] and the medical providers at [facility] strive to bring you the best medical care.  In doing so we not only want to address your current medical conditions and concerns but also to detect new conditions early and prevent illness, disease and health-related problems.    Medicare offers a yearly Wellness Visit which allows our clinical staff to assess your need for preventative services including immunizations, lifestyle education, counseling to decrease risk of preventable diseases and screening for fall risk and other medical concerns.    This visit is provided free of charge (no copay) for all Medicare recipients. The clinical pharmacists at [facility] have begun to conduct these Wellness Visits which will also include a thorough review of all your medications.    As you primary medical provider recommend that you make an appointment for your Annual Wellness Visit if you have not done so already this year.  You may set up this appointment before you leave today or you may call back [phone] and schedule an appointment.  Please make sure when you call that you mention that you are scheduling your Annual Wellness Visit with the clinical pharmacist so that the appointment may be made for the proper length of time.     Continue current medications. Continue good therapeutic lifestyle changes which include good diet and exercise. Fall precautions discussed with patient. If an FOBT was given today- please return it to our front desk. If you are over 5 years old - you may need Prevnar 55 or the adult Pneumonia vaccine.  **Flu shots are available--- please call and schedule a FLU-CLINIC appointment**  After your visit with [place] today you will receive a survey in the mail or online from [employer] regarding your care with [place]. Please take a moment to fill this out. Your feedback is very  important to [place] as you can help [place] better understand your patient needs as well as improve your experience and satisfaction. WE CARE ABOUT YOU!!!  We will get a chest x-ray today and make sure that the pulmonologist gets a copy of this report along with the blood work.

## 2016-07-30 LAB — VITAMIN D 25 HYDROXY (VIT D DEFICIENCY, FRACTURES): Vit D, 25-Hydroxy: 52.3 ng/mL (ref 30.0–100.0)

## 2016-07-30 LAB — HEPATIC FUNCTION PANEL
ALK PHOS: 94 IU/L (ref 39–117)
ALT: 23 IU/L (ref 0–44)
AST: 32 IU/L (ref 0–40)
Albumin: 4.4 g/dL (ref 3.5–4.8)
BILIRUBIN, DIRECT: 0.09 mg/dL (ref 0.00–0.40)
Bilirubin Total: 0.3 mg/dL (ref 0.0–1.2)
Total Protein: 7.3 g/dL (ref 6.0–8.5)

## 2016-07-30 LAB — CBC WITH DIFFERENTIAL/PLATELET
BASOS: 0 %
Basophils Absolute: 0 10*3/uL (ref 0.0–0.2)
EOS (ABSOLUTE): 0.1 10*3/uL (ref 0.0–0.4)
Eos: 1 %
HEMOGLOBIN: 12 g/dL — AB (ref 13.0–17.7)
Hematocrit: 35.9 % — ABNORMAL LOW (ref 37.5–51.0)
IMMATURE GRANS (ABS): 0 10*3/uL (ref 0.0–0.1)
Immature Granulocytes: 0 %
LYMPHS ABS: 1.5 10*3/uL (ref 0.7–3.1)
LYMPHS: 21 %
MCH: 30.9 pg (ref 26.6–33.0)
MCHC: 33.4 g/dL (ref 31.5–35.7)
MCV: 93 fL (ref 79–97)
MONOCYTES: 15 %
Monocytes Absolute: 1 10*3/uL — ABNORMAL HIGH (ref 0.1–0.9)
NEUTROS ABS: 4.3 10*3/uL (ref 1.4–7.0)
Neutrophils: 63 %
Platelets: 325 10*3/uL (ref 150–379)
RBC: 3.88 x10E6/uL — ABNORMAL LOW (ref 4.14–5.80)
RDW: 16 % — ABNORMAL HIGH (ref 12.3–15.4)
WBC: 6.9 10*3/uL (ref 3.4–10.8)

## 2016-07-30 LAB — BMP8+EGFR
BUN / CREAT RATIO: 12 (ref 10–24)
BUN: 14 mg/dL (ref 8–27)
CALCIUM: 9.3 mg/dL (ref 8.6–10.2)
CO2: 28 mmol/L (ref 18–29)
Chloride: 99 mmol/L (ref 96–106)
Creatinine, Ser: 1.13 mg/dL (ref 0.76–1.27)
GFR, EST AFRICAN AMERICAN: 76 mL/min/{1.73_m2} (ref >59)
GFR, EST NON AFRICAN AMERICAN: 65 mL/min/{1.73_m2} (ref >59)
Glucose: 86 mg/dL (ref 65–99)
POTASSIUM: 4.5 mmol/L (ref 3.5–5.2)
Sodium: 142 mmol/L (ref 134–144)

## 2016-07-30 LAB — LIPID PANEL
CHOL/HDL RATIO: 2.4 ratio (ref 0.0–5.0)
CHOLESTEROL TOTAL: 141 mg/dL (ref 100–199)
HDL: 58 mg/dL (ref >39)
LDL Calculated: 66 mg/dL (ref 0–99)
TRIGLYCERIDES: 85 mg/dL (ref 0–149)
VLDL Cholesterol Cal: 17 mg/dL (ref 5–40)

## 2016-08-30 ENCOUNTER — Other Ambulatory Visit: Payer: Self-pay | Admitting: Family Medicine

## 2016-09-26 ENCOUNTER — Encounter: Payer: Self-pay | Admitting: Family Medicine

## 2016-09-26 ENCOUNTER — Encounter: Payer: Self-pay | Admitting: Physician Assistant

## 2016-09-26 ENCOUNTER — Ambulatory Visit (INDEPENDENT_AMBULATORY_CARE_PROVIDER_SITE_OTHER): Payer: Medicare Other | Admitting: Family Medicine

## 2016-09-26 VITALS — BP 117/68 | HR 68 | Temp 99.1°F | Ht 64.0 in | Wt 154.0 lb

## 2016-09-26 DIAGNOSIS — R1314 Dysphagia, pharyngoesophageal phase: Secondary | ICD-10-CM | POA: Diagnosis not present

## 2016-09-26 MED ORDER — PREDNISONE 20 MG PO TABS: ORAL_TABLET | ORAL | 0 refills | Status: DC

## 2016-09-26 NOTE — Progress Notes (Signed)
BP 117/68   Pulse 68   Temp 99.1 F (37.3 C) (Oral)   Ht 5\' 4"  (1.626 m)   Wt 154 lb (69.9 kg)   BMI 26.43 kg/m    Subjective:    Patient ID: Anna Genre., male    DOB: [birth year], 71 y.o.   MRN: [id number]  HPI: Troy Rodgers. is a 71 y.o. male presenting on 09/26/2016 for ER Followup (dysphagia; has felt like he had tremendous amounts of drainage in his throat) and Discuss swallowing study (wife reports he has had difficulty getting strangled for several years, would llike to have a swallowing study done)   HPI Difficulty swallowing and increased drainage Patient comes in complaining of difficulty swallowing and increased drainage and for an ED follow-up. He was seen in the emergency department on 09/20/2016 for difficulty swallowing and mucus drainage and feeling like his drainage gets stuck in his throat and he can't swallow it down. She has had issues with food getting stuck for quite some time but is now starting to have issues with his fluids getting stuck in his throat as well. He denies any fevers or chills or sinus congestion or nasal drainage or sinus headache or pressure or ear congestion. He denies any shortness of breath or wheezing. He just feels like things get stuck in his throat and sometimes in his chest on the way down when he swallowing them. He denies any nausea vomiting abdominal pain  Relevant past medical, surgical, family and social history reviewed and updated as indicated. Interim medical history since our last visit reviewed. Allergies and medications reviewed and updated.  Review of Systems  Constitutional: Negative for chills and fever.  HENT: Positive for trouble swallowing. Negative for congestion, mouth sores, rhinorrhea, sinus pain, sinus pressure and sneezing.   Eyes: Negative for discharge.  Respiratory: Negative for shortness of breath and wheezing.   Cardiovascular: Negative for chest pain and leg swelling.  Gastrointestinal: Negative for  abdominal pain, diarrhea, nausea and vomiting.  Genitourinary: Negative for dysuria.  Musculoskeletal: Negative for back pain and gait problem.  Skin: Negative for color change and rash.  All other systems reviewed and are negative.   Per HPI unless specifically indicated above     Objective:    BP 117/68   Pulse 68   Temp 99.1 F (37.3 C) (Oral)   Ht 5\' 4"  (1.626 m)   Wt 154 lb (69.9 kg)   BMI 26.43 kg/m   Wt Readings from Last 3 Encounters:  09/26/16 154 lb (69.9 kg)  07/29/16 154 lb (69.9 kg)  03/26/16 151 lb (68.5 kg)    Physical Exam  Constitutional: He is oriented to person, place, and time. He appears well-developed and well-nourished. No distress.  HENT:  Right Ear: External ear normal.  Left Ear: External ear normal.  Nose: Nose normal.  Mouth/Throat: Oropharynx is clear and moist. No oropharyngeal exudate.  Eyes: Conjunctivae are normal. No scleral icterus.  Neck: Neck supple. No thyromegaly present.  Cardiovascular: Normal rate, regular rhythm, normal heart sounds and intact distal pulses.   No murmur heard. Pulmonary/Chest: Effort normal and breath sounds normal. No respiratory distress. He has no wheezes.  Abdominal: Soft. Bowel sounds are normal. He exhibits no distension. There is no tenderness. There is no rebound.  Musculoskeletal: Normal range of motion. He exhibits no edema.  Lymphadenopathy:    He has no cervical adenopathy.  Neurological: He is alert and oriented to person, place, and time. Coordination  normal.  Skin: Skin is warm and dry. No rash noted. He is not diaphoretic.  Psychiatric: He has a normal mood and affect. His behavior is normal.  Nursing note and vitals reviewed.     Assessment & Plan:   Problem List Items Addressed This Visit    None    Visit Diagnoses    Pharyngoesophageal dysphagia    -  Primary   Likely related to GERD or achalasia, if steroids just in case it's related to allergic rhinosinusitis.   Relevant Medications    predniSONE (DELTASONE) 20 MG tablet   Other Relevant Orders   SLP modified barium swallow   Ambulatory referral to Gastroenterology       Follow up plan: Return if symptoms worsen or fail to improve.  Counseling provided for all of the vaccine components Orders Placed This Encounter  Procedures  . Ambulatory referral to Gastroenterology  . SLP modified barium swallow    Caryl Pina, MD [facility] 09/26/2016, 11:51 AM

## 2016-10-02 ENCOUNTER — Ambulatory Visit (INDEPENDENT_AMBULATORY_CARE_PROVIDER_SITE_OTHER): Payer: Medicare Other | Admitting: Physician Assistant

## 2016-10-02 ENCOUNTER — Encounter: Payer: Self-pay | Admitting: Physician Assistant

## 2016-10-02 VITALS — BP 122/60 | HR 68 | Ht 64.57 in | Wt 157.5 lb

## 2016-10-02 DIAGNOSIS — R1314 Dysphagia, pharyngoesophageal phase: Secondary | ICD-10-CM | POA: Diagnosis not present

## 2016-10-02 NOTE — Progress Notes (Addendum)
Chief Complaint: Dysphagia  HPI:  [name] is a 71 year old Caucasian male with a past medical history of arthritis, seizures and thyroid disease as well as others listed below, who was referred to me by Dettinger, Fransisca Kaufmann, MD for a complaint of dysphagia.     Today, the patient presents to clinic accompanied by his wife who does assist with this history. Together they explain that around 4 years ago the patient had similar symptoms where he felt like food was getting stuck on its way down his throat. Apparently he had an EGD in [place] then which "didn't show anything", we do not have report of this today. They placed him on Omeprazole 20 mg daily and his symptoms seemed to be somewhat better. Over the past 2-3 years though he has had an increase in symptoms and now describes that even drinking liquids can sometimes get "hung up in his throat". This also occurs with his pills and most of the food that he eats on an everyday basis. Per his wife the patient was recently seen in the ED in [place] and had a CT of the neck which she says was normal. Patient denies any heartburn, reflux or abdominal pain. Associated symptoms do include some eructations.   Patient does have a history of epilepsy and has at least 5-6 petit mal seizures per month regardless of medicine.   Patient denies fever, chills, blood in his stool, melena, weight loss, fatigue, anorexia, change in bowel habits, nausea, vomiting or symptoms that awaken him at night.  Past Medical History:  Diagnosis Date  . Abdominal pain   . Arthritis   . Asthma   . Bradycardia   . Cataract   . Chest pain   . Cholelithiases   . Depressive disorder, not elsewhere classified   . External hemorrhoid   . HLD (hyperlipidemia)   . Hyperplasia of prostate   . Impotence of organic origin   . Kidney stone   . Localization-related (focal) (partial) epilepsy and epileptic syndromes with complex partial seizures, without mention of intractable epilepsy     . Nasal septal perforation   . Other and unspecified hyperlipidemia   . Seizures ([place])   . Thyroid disease     Past Surgical History:  Procedure Laterality Date  . CHOLECYSTECTOMY  04/28/2013   Procedure: LAPAROSCOPIC CHOLECYSTECTOMY;  Surgeon: Ralene Ok, MD;  Location: [facility];  Service: General;;  . LEFT SHOULDER SURGERY  1996    Current Outpatient Prescriptions  Medication Sig Dispense Refill  . aspirin 81 MG EC tablet Take 81 mg by mouth daily.      Marland Kitchen atorvastatin (LIPITOR) 40 MG tablet TAKE 1 TABLET EVERY EVENING AS DIRECTED 90 tablet 1  . Cholecalciferol (VITAMIN D3) 2000 UNITS TABS Take 1 tablet twice Mon-Fri and 2 twice a day on Sat and Sun    . escitalopram (LEXAPRO) 20 MG tablet TAKE 1 TABLET DAILY 90 tablet 3  . ezetimibe (ZETIA) 10 MG tablet TAKE ONE TABLET BY MOUTH DAILY FOR HYPERLIPIDERMEA (Patient taking differently: TAKE 1/2 TABLET BY MOUTH DAILY FOR HYPERLIPIDERMEA) 90 tablet 1  . folic acid (FOLVITE) 1 MG tablet Take 1 mg by mouth 2 (two) times daily.     Marland Kitchen guaiFENesin (MUCINEX) 600 MG 12 hr tablet Take 600 mg by mouth as needed.     . lacosamide (VIMPAT) 200 MG TABS tablet Take 200 mg by mouth 2 (two) times daily.    Marland Kitchen levothyroxine (SYNTHROID, LEVOTHROID) 50 MCG tablet Take 1 tablet (  50 mcg total) by mouth daily. 90 tablet 3  . methotrexate (RHEUMATREX) 2.5 MG tablet Take 10 mg by mouth once a week.     . omega-3 fish oil (MAXEPA) 1000 MG CAPS capsule Take 2 capsules by mouth daily.    Marland Kitchen omeprazole (PRILOSEC) 20 MG capsule Take 20 mg by mouth daily.     . predniSONE (DELTASONE) 20 MG tablet Take 3 tabs daily for 1 week, then 2 tabs daily for week 2, then 1 tab daily for week 3. 42 tablet 0  . primidone (MYSOLINE) 250 MG tablet 1 in the am and 1 1/2 at bedtime     No current facility-administered medications for this visit.     Allergies as of 10/02/2016 - Review Complete 10/02/2016  Allergen Reaction Noted  . Fentanyl Other (See Comments) 04/27/2013  .  Hydrocodone  04/27/2013    Family History  Problem Relation Age of Onset  . Kidney failure Father     died age 76  . Kidney disease Father   . Heart disease Father   . Colon cancer Mother   . Alzheimer's disease Mother   . Heart failure Sister     CHF  . Rheum arthritis Sister     Social History   Social History  . Marital status: Married    Spouse name: N/A  . Number of children: 2  . Years of education: N/A   Occupational History  . Retired    Social History Main Topics  . Smoking status: Never Smoker  . Smokeless tobacco: Former Systems developer  . Alcohol use No  . Drug use: No  . Sexual activity: Not on file   Other Topics Concern  . Not on file   Social History Narrative   Lives with wife.      Review of Systems:    Constitutional: No weight loss, fever or chills Skin: No rash  Cardiovascular: No chest pain Respiratory: Positive for cough Gastrointestinal: See HPI and otherwise negative Genitourinary: No dysuria Neurological: No headache Musculoskeletal: Positive for arthritis Hematologic: No bleeding Psychiatric: No history of depression or anxiety   Physical Exam:  Vital signs: BP 122/60 (BP Location: Left Arm, Patient Position: Sitting, Cuff Size: Normal)   Pulse 68   Ht 5' 4.57" (1.64 m) Comment: height measured without shoes  Wt 157 lb 8 oz (71.4 kg)   BMI 26.56 kg/m   Constitutional:   Pleasant Caucasian male appears to be in NAD, Well developed, Well nourished, alert and cooperative Head:  Normocephalic and atraumatic. Eyes:   PEERL, EOMI. No icterus. Conjunctiva pink. Ears:  Normal auditory acuity. hearing aids present Neck:  Supple Throat: Oral cavity and pharynx without inflammation, swelling or lesion.  Respiratory: Respirations even and unlabored. Lungs clear to auscultation bilaterally.   No wheezes, crackles, or rhonchi.  Cardiovascular: Normal S1, S2. No MRG. Regular rate and rhythm. No peripheral edema, cyanosis or pallor.    Gastrointestinal:  Soft, nondistended, nontender. No rebound or guarding. Normal bowel sounds. No appreciable masses or hepatomegaly. Rectal:  Not performed.  Msk:  Symmetrical without gross deformities. Without edema, no deformity or joint abnormality.  Neurologic:  Alert and  oriented x4;  grossly normal neurologically.  Skin:   Dry and intact without significant lesions or rashes. Psychiatric:  Demonstrates good judgement and reason without abnormal affect or behaviors.  MOST RECENT LABS AND IMAGING: CBC    Component Value Date/Time   WBC 6.9 07/29/2016 1000   WBC 7.1 01/27/2015 1113  WBC 14.7 (H) 04/29/2013 1550   RBC 3.88 (L) 07/29/2016 1000   RBC 3.84 (A) 01/27/2015 1113   RBC 3.39 (L) 04/29/2013 1550   HGB 12.2 (A) 01/27/2015 1113   HGB 11.8 (L) 05/06/2013 1717   HCT 35.9 (L) 07/29/2016 1000   PLT 325 07/29/2016 1000   MCV 93 07/29/2016 1000   MCH 30.9 07/29/2016 1000   MCH 31.7 (A) 01/27/2015 1113   MCH 34.8 (H) 04/29/2013 1550   MCHC 33.4 07/29/2016 1000   MCHC 32.7 01/27/2015 1113   MCHC 34.2 04/29/2013 1550   RDW 16.0 (H) 07/29/2016 1000   LYMPHSABS 1.5 07/29/2016 1000   MONOABS 3.7 (H) 04/27/2013 1506   EOSABS 0.1 07/29/2016 1000   BASOSABS 0.0 07/29/2016 1000    CMP     Component Value Date/Time   NA 142 07/29/2016 1000   K 4.5 07/29/2016 1000   CL 99 07/29/2016 1000   CO2 28 07/29/2016 1000   GLUCOSE 86 07/29/2016 1000   GLUCOSE 132 (H) 04/29/2013 1550   BUN 14 07/29/2016 1000   CREATININE 1.13 07/29/2016 1000   CREATININE 1.02 01/14/2013 1050   CALCIUM 9.3 07/29/2016 1000   PROT 7.3 07/29/2016 1000   ALBUMIN 4.4 07/29/2016 1000   AST 32 07/29/2016 1000   ALT 23 07/29/2016 1000   ALKPHOS 94 07/29/2016 1000   BILITOT 0.3 07/29/2016 1000   GFRNONAA 65 07/29/2016 1000   GFRNONAA 76 01/14/2013 1050   GFRAA 76 07/29/2016 1000   GFRAA 88 01/14/2013 1050    Assessment: 1. Dysphagia: Worsening over the past 4 years, EGD 4 years ago was normal per  patient and his wife, now with even liquids on a daily basis, history of uncontrolled epilepsy with 5-6 petit mal seizures per month per his wife, recommend swallow study evaluation first before committing to anesthesia; consider esophageal ring versus stricture versus web versus mass versus impaired motility versus other  Plan: 1. Requested previous records from patient's GI office as well as recent CT of the neck 2. Ordered a barium swallow evaluation with tablet. If this does show something mechanical, recommend an EGD. This would need to be scheduled in the hospital due to patient's history of seizures. It would need to be with Dr. Hilarie Fredrickson as he is supervising physician this afternoon. 3. Reviewed anti-dysphagia measures including taking small bites, drinking since of water between bites, chewing well, avoiding distraction while eating and the chin tuck technique 4. Continue Omeprazole 20 mg daily 5. Patient to follow in clinic per recommendations after imaging above  Ellouise Newer, PA-C [facility] 10/02/2016, 2:44 PM  Cc: Dettinger, Fransisca Kaufmann, MD   10/03/16 Addendum: Received previous EGD and colonoscopy report. 08/24/12-EGD-impression: Mild gastritis in the antrum, inflammation at the gastroesophageal junction; recommendations: Proton pump inhibitor 20 mg once a day if symptoms persisted consider motility study 05/10/14-colonoscopy-impression: Normal colonoscopy; Recommendations: Repeat in 5 years due to family history of colon cancer  Ellouise Newer, PA-C 10/03/16   1610  Addendum: Reviewed and agree with initial management. Please place recall for colonoscopy for Oct 2020 ([facility]) Jerene Bears, MD

## 2016-10-02 NOTE — Patient Instructions (Signed)
You have been scheduled for a Barium Esophogram at [facility] (1st floor of the hospital) on 10-07-16 at 9:30 am. Please arrive 15 minutes prior to your appointment for registration. Make certain not to have anything to eat or drink 6 hours prior to your test. If you need to reschedule for any reason, please contact radiology at [phone] to do so. __________________________________________________________________ A barium swallow is an examination that concentrates on views of the esophagus. This tends to be a double contrast exam (barium and two liquids which, when combined, create a gas to distend the wall of the oesophagus) or single contrast (non-ionic iodine based). The study is usually tailored to your symptoms so a good history is essential. Attention is paid during the study to the form, structure and configuration of the esophagus, looking for functional disorders (such as aspiration, dysphagia, achalasia, motility and reflux) EXAMINATION You may be asked to change into a gown, depending on the type of swallow being performed. A radiologist and radiographer will perform the procedure. The radiologist will advise you of the type of contrast selected for your procedure and direct you during the exam. You will be asked to stand, sit or lie in several different positions and to hold a small amount of fluid in your mouth before being asked to swallow while the imaging is performed .In some instances you may be asked to swallow barium coated marshmallows to assess the motility of a solid food bolus. The exam can be recorded as a digital or video fluoroscopy procedure. POST PROCEDURE It will take 1-2 days for the barium to pass through your system. To facilitate this, it is important, unless otherwise directed, to increase your fluids for the next 24-48hrs and to resume your normal diet.  This test typically takes about 30 minutes to  perform. __________________________________________________________________________________

## 2016-10-07 ENCOUNTER — Ambulatory Visit (HOSPITAL_COMMUNITY)
Admission: RE | Admit: 2016-10-07 | Discharge: 2016-10-07 | Disposition: A | Discharge status: Home / Self Care | Payer: Medicare Other | Source: Ambulatory Visit | Attending: Physician Assistant | Admitting: Physician Assistant

## 2016-10-07 DIAGNOSIS — R1310 Dysphagia, unspecified: Secondary | ICD-10-CM | POA: Diagnosis present

## 2016-10-07 DIAGNOSIS — R1314 Dysphagia, pharyngoesophageal phase: Secondary | ICD-10-CM | POA: Insufficient documentation

## 2016-10-08 ENCOUNTER — Other Ambulatory Visit: Payer: Self-pay

## 2016-10-08 ENCOUNTER — Telehealth: Payer: Self-pay | Admitting: Physician Assistant

## 2016-10-08 DIAGNOSIS — K229 Disease of esophagus, unspecified: Secondary | ICD-10-CM

## 2016-10-08 NOTE — Telephone Encounter (Signed)
The pt would like to have speech path appt and rehab.  Referral has been made, the pt will call if not heard from them or our office in 1 week. [phone] Rehab at [facility].  Left message on machine to call back

## 2016-10-08 NOTE — Telephone Encounter (Signed)
Pt wife returning Patty's call to discuss results from swallow test done yesterday 10/07/16

## 2016-10-08 NOTE — Telephone Encounter (Signed)
I spoke with Troy Rodgers and they will call the pt with an appt.

## 2016-10-23 ENCOUNTER — Telehealth: Payer: Self-pay

## 2016-10-23 DIAGNOSIS — R1310 Dysphagia, unspecified: Secondary | ICD-10-CM

## 2016-10-23 NOTE — Telephone Encounter (Signed)
Order has been added to [facility] for [employer].  Forwarded to [employer] and Katha Hamming.

## 2016-10-23 NOTE — Telephone Encounter (Signed)
-----   Message from Levin Erp, [place] sent at 10/23/2016  9:17 AM EDT ----- Regarding: Please see message below and arrange Thank you-JLL  ----- Message ----- From: Ephraim Hamburger, CCC-SLP Sent: 10/22/2016   5:37 PM To: Barron Alvine, RN, Simeon Craft, #  Hello Ms. Fabio Asa, My name is Genene Churn and I am the SLP at [facility] in [place]. We received a referral to see Anna Genre. Following an abnormal barium swallow he had on 10/07/16. After reviewing his chart and imaging, I would prefer to complete MBSS to help establish treatment course (as he appears to have esophageal and pharyngeal component). Could you please place referral for Modified barium swallow study to be completed at [facility] and it will allow me to see him for that and then see him for treatment. I have copied our receptionists. Thank you,  Genene Churn, CCC-SLP [phone]

## 2016-10-25 ENCOUNTER — Other Ambulatory Visit (HOSPITAL_COMMUNITY): Payer: Self-pay | Admitting: Physician Assistant

## 2016-10-25 DIAGNOSIS — R1319 Other dysphagia: Secondary | ICD-10-CM

## 2016-10-28 ENCOUNTER — Ambulatory Visit (HOSPITAL_COMMUNITY): Payer: Medicare Other | Attending: Physician Assistant | Admitting: Speech Pathology

## 2016-10-28 ENCOUNTER — Ambulatory Visit (HOSPITAL_COMMUNITY)
Admission: RE | Admit: 2016-10-28 | Discharge: 2016-10-28 | Disposition: A | Discharge status: Home / Self Care | Payer: Medicare Other | Source: Ambulatory Visit | Attending: Physician Assistant | Admitting: Physician Assistant

## 2016-10-28 ENCOUNTER — Encounter (HOSPITAL_COMMUNITY): Payer: Self-pay | Admitting: Speech Pathology

## 2016-10-28 DIAGNOSIS — R1319 Other dysphagia: Secondary | ICD-10-CM | POA: Insufficient documentation

## 2016-10-28 DIAGNOSIS — R1310 Dysphagia, unspecified: Secondary | ICD-10-CM

## 2016-10-28 DIAGNOSIS — R1312 Dysphagia, oropharyngeal phase: Secondary | ICD-10-CM

## 2016-10-28 NOTE — Therapy (Signed)
[place] Rodgers, [place], [address] Phone: [phone]   Fax:  [phone]  Modified Barium Swallow  Patient Details  Name: Doctor Sheahan. MRN: [id number] Date of Birth: 1945-09-13 No Data Recorded  Encounter Date: 10/28/2016      End of Session - 10/28/16 2149    Visit Number 1   Number of Visits 4   Authorization Type [employer] Medicare   SLP Start Time 0355   SLP Stop Time  1435   SLP Time Calculation (min) 41 min   Activity Tolerance Patient tolerated treatment well      Past Medical History:  Diagnosis Date  . Abdominal pain   . Arthritis   . Asthma   . Bradycardia   . Cataract   . Chest pain   . Cholelithiases   . Depressive disorder, not elsewhere classified   . External hemorrhoid   . HLD (hyperlipidemia)   . Hyperplasia of prostate   . Impotence of organic origin   . Kidney stone   . Localization-related (focal) (partial) epilepsy and epileptic syndromes with complex partial seizures, without mention of intractable epilepsy   . Nasal septal perforation   . Other and unspecified hyperlipidemia   . Seizures ([place])   . Thyroid disease     Past Surgical History:  Procedure Laterality Date  . CHOLECYSTECTOMY  04/28/2013   Procedure: LAPAROSCOPIC CHOLECYSTECTOMY;  Surgeon: Ralene Ok, MD;  Location: [facility];  Service: General;;  . LEFT SHOULDER SURGERY  1996    There were no vitals filed for this visit.      Subjective Assessment - 10/28/16 2138    Subjective "He was coughing when he ate chips this morning."   Patient is accompained by: Family member   Special Tests MBSS   Currently in Pain? No/denies           General - 10/28/16 2139      General Information   Date of Onset 10/02/16   HPI Mr. Jacinto is a 71 year old Caucasian male with a past medical history of arthritis, seizures and thyroid disease who was referred to SLP by Ellouise Newer from [place] a complaint of  dysphagia. She evaluated the Pt in her office in March following a referral from Dr. Warrick Parisian. The Pt and wife share that 4 years ago the patient had similar symptoms where he felt like food was getting stuck on its way down his throat. He had EGD in [place] 08/24/12-EGD-impression: Mild gastritis in the antrum, inflammation at the gastroesophageal junction;recommendations:Proton pump inhibitor 20 mg once a day ifsymptoms persisted consider motility study. They placed him on Omeprazole 20 mg daily and his symptoms seemedto be somewhat better. Over the past 2-3 years though he has had an increase in symptoms and now describes that even drinking liquids can sometimes get "hung up in his throat". This also occurs with his pills and most of the food that he eats on an everyday basis. Per his wife the patient was recently seen in the ED in [place] had a CT of the neck which she says was normal. Patient denies any heartburn, reflux or abdominal pain.Patient does have a history of epilepsy and has at least 5-6 petit mal seizures per month regardless of medicine. Pt had barium swallow 10/07/2016 which showed: Silent laryngeal penetration to the level of the vocal cords.Diminished esophageal motility with a poor secondary stripping wave with occasional tertiary contractions in the mid and distal esophagus. No stricture  or mass. No esophagitis.   Type of Study MBS-Modified Barium Swallow Study   Previous Swallow Assessment Barium swallow in March 2018   Diet Prior to this Study Regular;Thin liquids   Temperature Spikes Noted No   Respiratory Status Room air   History of Recent Intubation No   Behavior/Cognition Alert;Cooperative;Pleasant mood   Oral Cavity Assessment Within Functional Limits   Oral Care Completed by SLP No   Oral Cavity - Dentition Adequate natural dentition   Vision Functional for self feeding   Self-Feeding Abilities Able to feed self   Patient Positioning Upright in chair   Baseline  Vocal Quality Normal;Breathy;Low vocal intensity   Volitional Cough Strong   Volitional Swallow Able to elicit   Anatomy Within functional limits   Pharyngeal Secretions Not observed secondary MBS           Oral Preparation/Oral Phase - 10/28/16 2140      Oral Preparation/Oral Phase   Oral Phase Within functional limits     Electrical stimulation - Oral Phase   Was Electrical Stimulation Used No          Pharyngeal Phase - 10/28/16 2140      Pharyngeal Phase   Pharyngeal Phase Impaired     Pharyngeal - Thin   Pharyngeal- Thin Teaspoon Swallow initiation at vallecula;Reduced epiglottic inversion   Pharyngeal- Thin Cup Swallow initiation at vallecula;Reduced epiglottic inversion   Pharyngeal- Thin Straw Swallow initiation at vallecula;Swallow initiation at pyriform sinus;Reduced epiglottic inversion     Pharyngeal - Solids   Pharyngeal- Puree Swallow initiation at vallecula;Reduced epiglottic inversion;Reduced tongue base retraction;Pharyngeal residue - valleculae  Pt swallowed 2x for each bolus   Pharyngeal- Regular Reduced epiglottic inversion;Reduced tongue base retraction;Pharyngeal residue - valleculae   Pharyngeal- Pill Reduced epiglottic inversion     Electrical Stimulation - Pharyngeal Phase   Was Electrical Stimulation Used No          Cricopharyngeal Phase - 10/28/16 2143      Cervical Esophageal Phase   Cervical Esophageal Phase Impaired     Cervical Esophageal Phase - Solids   Puree Prominent cricopharyngeal segment     Cervical Esophageal Phase - Comment   Other Esophageal Phase Observations Esophageal sweep revealed barium filled esophagus with delayed emptying; retrograde bolus movement noted with coughing           SLP Short Term Goals - 10/28/16 2153      SLP SHORT TERM GOAL #1   Title Pt will complete lingual and pharyngeal exercises x 10 each with indirect cues from SLP   Baseline Able to complete effortful swallow with verbal cues    Time 4   Period Weeks   Status New     SLP SHORT TERM GOAL #2   Title Pt will implement recommended compensatory swallow strategies during po consumption with indirect cues.   Baseline mod cues   Time 4   Period Weeks   Status New     SLP SHORT TERM GOAL #3   Title Pt will complete vocal fold adduction and resonant voice therapy exercises x 10 each with direct instruction from SLP   Baseline novel task   Time 4   Period Weeks   Status New     SLP SHORT TERM GOAL #4   Title Pt will seen ENT consult to evaluate vocal folds due to breathy vocal quality   Status New          SLP Long Term Goals - 10/28/16 2205  SLP LONG TERM GOAL #1   Title Same as short          Plan - 04-Nov-2016 18-Oct-2148    Clinical Impression Statement Pt assessed in the lateral position and presented with barium tinged thin via teaspoon/cup/straw, puree, regular textures, and barium tablet. Oral phase generally WNL with the exception of suspected mild lingual weakness (? Greater on right). Pt with mild pharyngeal phase and suspected primary esophageal phase dysphagia characterized by swallow trigger at the level of the valleculae with cup sips thin, premature spillage to the pyriforms with straw sips thin and when taking barium tablet with thin, and decreased epiglottic deflection resulting in min vallecular residuals after initial swallow which Pt independently clears with repeat swallow. Pt responded well with cue for effortful swallow and resulted in improved epiglottic deflection and no vallecular residuals after initial swallow with puree and solid negating need for double swallows.  Pt did demonstrate premature spillage of thin liquids when attempting to swallow barium tablet with cup sip thin which then resulted in penetration and aspiration of thins into anterior portion of trachea. Weak cough elicited which did not remove aspirate. SLP cue for strong cough still did not remove aspirate. Barium tablet was  then presented in puree and Pt swallowed without incident, however it did become transiently delayed in distal esophagus. Esophageal sweep revealed incomplete clearance of barium by primary peristaltic wave, retrograde peristalsis, and retrograde movement toward cervical esophagus. Pt was presented with thin liquid wash and did again experience trace aspiration of thin liquids after the swallow from residuals. No cough elicited, however SLP cue to cough did result in removal of aspirate. Suspect primary esophageal dysphagia is resulting in secondary pharyngeal phase dysphagia. Mild prominence of cricopharyngeus noted. Recommend regular textures and thin liquids with avoidance of mixed consistencies (thins with solid like: cold cereal, watermelon, chicken noodle soup, medications with water) and po medications presented whole with applesauce or yogurt. Also recommend ENT consult to evaluate vocal folds due to suspected bowing of vocal folds (breathy vocal quality, reduced vocal intensity, reduced breath support, and aspiration during/after the swallow). Pt may benefit from voice therapy if indicated by ENT. Will plan to see Pt for dysphagia treatment in the interim. Pt/spouse agreeable to plan of care.    Speech Therapy Frequency 1x /week   Duration 4 weeks   Treatment/Interventions Aspiration precaution training;Pharyngeal strengthening exercises;Diet toleration management by SLP;Compensatory techniques;SLP instruction and feedback;Patient/family education;Compensatory strategies   Potential to Achieve Goals Good   Potential Considerations Previous level of function   SLP Home Exercise Plan Pt will complete HEP as assigned to facilitate carryover of treatment exercises in home environment.   Consulted and Agree with Plan of Care Patient;Family member/caregiver   Family Member Consulted Spouse      Patient will benefit from skilled therapeutic intervention in order to improve the following deficits and  impairments:   Dysphagia, oropharyngeal phase  Dysphagia, unspecified type - Plan: SLP modified barium swallow      G-Codes - November 04, 2016 10-18-04    Functional Assessment Tool Used MBSS; clinical judgment   Functional Limitations Swallowing   Swallow Current Status (H2094) At least 20 percent but less than 40 percent impaired, limited or restricted   Swallow Goal Status (B0962) At least 1 percent but less than 20 percent impaired, limited or restricted          Recommendations/Treatment - 11-04-16 2142/10/19      Swallow Evaluation Recommendations   Recommended Consults Consider ENT evaluation  due to breathy vocal quality    SLP Diet Recommendations Age appropriate regular;Thin   Liquid Administration via Cup;Straw   Medication Administration Whole meds with puree   Supervision Patient able to self feed   Compensations Multiple dry swallows after each bite/sip;Effortful swallow   Postural Changes Seated upright at 90 degrees;Remain upright for at least 30 minutes after feeds/meals        Problem List Patient Active Problem List   Diagnosis Date Noted  . Epilepsy ([place]) 11/06/2015  . Hypothyroidism 05/04/2014  . Solitary pulmonary nodule RLL lateral basal segment  10/12/2013  . Rheumatoid arthritis ([place]) 08/19/2013  . High risk medication use 08/19/2013  . BPH (benign prostatic hyperplasia) 08/19/2013  . Seizure disorder ([place]) 05/06/2013  . Acute gangrenous cholecystitis 04/28/2013  . Abnormal EKG 04/22/2012  . Hyperlipidemia   . Depressive disorder, not elsewhere classified   . Localization-related (focal) (partial) epilepsy and epileptic syndromes with complex partial seizures, without mention of intractable epilepsy   . Kidney stone   . Impotence of organic origin   . External hemorrhoid   . Nasal septal perforation   . Cataract   . Hyperplasia of prostate    Thank you,  Teddy Spike [phone]  [facility] 10/28/2016, 10:06 PM  [facility] [address] [place], [place], [address] Phone: [phone]   Fax:  [phone]  Name: Krishawn Vanderweele. MRN: [id number] Date of Birth: Jun 14, 1946

## 2016-10-29 ENCOUNTER — Ambulatory Visit (HOSPITAL_COMMUNITY): Payer: Medicare Other | Admitting: Speech Pathology

## 2016-10-29 ENCOUNTER — Encounter (HOSPITAL_COMMUNITY): Payer: Self-pay | Admitting: Speech Pathology

## 2016-10-29 DIAGNOSIS — R1312 Dysphagia, oropharyngeal phase: Secondary | ICD-10-CM

## 2016-10-29 NOTE — Progress Notes (Signed)
Please do ear nose and throat referral as recommended for this patient if not already done

## 2016-10-29 NOTE — Therapy (Signed)
[facility] [place], [place], [address] Phone: [phone]   Fax:  [phone]  Speech Language Pathology Treatment  Patient Details  Name: Troy Rodgers. MRN: [id number] Date of Birth: 08-03-1945 No Data Recorded  Encounter Date: 10/29/2016      End of Session - 10/29/16 1944    Visit Number 1   Number of Visits 4   Authorization Type [employer] Medicare   SLP Start Time 1027   SLP Stop Time  1700   SLP Time Calculation (min) 50 min   Activity Tolerance Patient tolerated treatment well      Past Medical History:  Diagnosis Date  . Abdominal pain   . Arthritis   . Asthma   . Bradycardia   . Cataract   . Chest pain   . Cholelithiases   . Depressive disorder, not elsewhere classified   . External hemorrhoid   . HLD (hyperlipidemia)   . Hyperplasia of prostate   . Impotence of organic origin   . Kidney stone   . Localization-related (focal) (partial) epilepsy and epileptic syndromes with complex partial seizures, without mention of intractable epilepsy   . Nasal septal perforation   . Other and unspecified hyperlipidemia   . Seizures ([place])   . Thyroid disease     Past Surgical History:  Procedure Laterality Date  . CHOLECYSTECTOMY  04/28/2013   Procedure: LAPAROSCOPIC CHOLECYSTECTOMY;  Surgeon: Ralene Ok, MD;  Location: [facility];  Service: General;;  . LEFT SHOULDER SURGERY  1996    There were no vitals filed for this visit.      Subjective Assessment - 10/29/16 1942    Subjective "He was practicing his hard swallow."   Patient is accompained by: Family member   Currently in Pain? No/denies          ADULT SLP TREATMENT - 10/29/16 0001      General Information   Behavior/Cognition Alert;Cooperative;Pleasant mood   Patient Positioning Upright in chair   Oral care provided N/A   HPI Troy Rodgers is a 71 year old Caucasian male with a past medical history of arthritis, seizures and thyroid disease  who was referred to SLP by Ellouise Newer from [place] for a complaint of dysphagia. She evaluated the Pt in her office in March following a referral from Dr. Warrick Parisian. The Pt and wife share that 4 years ago the patient had similar symptoms where he felt like food was getting stuck on its way down his throat. He had EGD in [place] 08/24/12-EGD-impression: Mild gastritis in the antrum, inflammation at the gastroesophageal junction; recommendations: Proton pump inhibitor 20 mg once a day if symptoms persisted consider motility study. They placed him on Omeprazole 20 mg daily and his symptoms seemed to be somewhat better. Over the past 2-3 years though he has had an increase in symptoms and now describes that even drinking liquids can sometimes get "hung up in his throat". This also occurs with his pills and most of the food that he eats on an everyday basis. Per his wife the patient was recently seen in the ED in [place] and had a CT of the neck which she says was normal. Patient denies any heartburn, reflux or abdominal pain.  Patient does have a history of epilepsy and has at least 5-6 petit mal seizures per month regardless of medicine. Pt had barium swallow 10/07/2016 which showed: Silent laryngeal penetration to the level of the vocal cords.Diminished esophageal motility with a poor secondary  stripping wave with occasional tertiary contractions in the mid and distal esophagus. No stricture or mass.  No esophagitis.     Treatment Provided   Treatment provided Dysphagia     Dysphagia Treatment   Temperature Spikes Noted No   Respiratory Status Room air   Oral Cavity - Dentition Adequate natural dentition   Treatment Methods Skilled observation;Therapeutic exercise;Compensation strategy training;Patient/caregiver education   Patient observed directly with PO's Yes   Type of PO's observed Regular;Thin liquids   Feeding Able to feed self   Liquids provided via Cup   Pharyngeal Phase Signs & Symptoms  Multiple swallows;Delayed throat clear   Type of cueing Verbal   Amount of cueing Minimal     Pain Assessment   Pain Assessment No/denies pain     Assessment / Recommendations / Plan   Plan Continue with current plan of care     Dysphagia Recommendations   Diet recommendations Regular;Thin liquid   Liquids provided via Cup   Medication Administration Whole meds with puree   Supervision Patient able to self feed   Compensations Multiple dry swallows after each bite/sip;Effortful swallow   Postural Changes and/or Swallow Maneuvers Out of bed for meals;Seated upright 90 degrees;Upright 30-60 min after meal     General Recommendations   General recommendations --  ENT consult for voice   Oral Care Recommendations Oral care BID;Patient independent with oral care     Progression Toward Goals   Progression toward goals Progressing toward goals          SLP Education - 10/28/16 2139    Education provided Yes   Education Details Reviewed MBSS imaging with Pt and spouse with recommendation for regular textures and thin liquids with strategies   Person(s) Educated Patient;Spouse   Methods Explanation;Other (comment)  imaging   Comprehension Verbalized understanding;Need further instruction          SLP Short Term Goals - 10/29/16 1945      SLP SHORT TERM GOAL #1   Title Pt will complete lingual and pharyngeal exercises x 10 each with indirect cues from SLP   Baseline Able to complete effortful swallow with verbal cues   Time 4   Period Weeks   Status On-going     SLP SHORT TERM GOAL #2   Title Pt will implement recommended compensatory swallow strategies during po consumption with indirect cues.   Baseline mod cues   Time 4   Period Weeks   Status On-going     SLP SHORT TERM GOAL #3   Title Pt will complete vocal fold adduction and resonant voice therapy exercises x 10 each with direct instruction from SLP   Baseline novel task   Time 4   Period Weeks   Status  On-going     SLP SHORT TERM GOAL #4   Title Pt will seen ENT consult to evaluate vocal folds due to breathy vocal quality   Status On-going          SLP Long Term Goals - 10/29/16 1945      SLP LONG TERM GOAL #1   Title Same as short          Plan - 10/29/16 1945    Clinical Impression Statement Pt accompanied by his wife today following MBSS completed yesterday. The study and recommendations were again reviewed with pt/spouse and their questions were answered. Pt introduced to effortful swallow exercise and chin tuck against resistance. Lingual exercises were completed with SLP providing moderate resistance. Pt  able to return demonstrate exercises with min cues. Sustained /a/ duration assessed and measured to be: 8.9, 9.3, and 11.5 seconds; /s/= 4.98 and 8.06 seconds; /z/= 17.2 and 17.4 seconds. Pt noted to have vocal tremor with sustained /a/. Recommend ENT consult as per yesterday. Continue POC.    Speech Therapy Frequency 1x /week   Duration 4 weeks   Treatment/Interventions Aspiration precaution training;Pharyngeal strengthening exercises;Diet toleration management by SLP;Compensatory techniques;SLP instruction and feedback;Patient/family education;Compensatory strategies   Potential to Achieve Goals Good   Potential Considerations Previous level of function   SLP Home Exercise Plan Pt will complete HEP as assigned to facilitate carryover of treatment exercises in home environment.   Consulted and Agree with Plan of Care Patient;Family member/caregiver   Family Member Consulted Spouse      Patient will benefit from skilled therapeutic intervention in order to improve the following deficits and impairments:   Dysphagia, oropharyngeal phase      G-Codes - 11-10-2016 10/24/04    Functional Assessment Tool Used MBSS; clinical judgment   Functional Limitations Swallowing   Swallow Current Status (L8756) At least 20 percent but less than 40 percent impaired, limited or restricted    Swallow Goal Status (E3329) At least 1 percent but less than 20 percent impaired, limited or restricted      Problem List Patient Active Problem List   Diagnosis Date Noted  . Epilepsy ([place]) 11/06/2015  . Hypothyroidism 05/04/2014  . Solitary pulmonary nodule RLL lateral basal segment  10/12/2013  . Rheumatoid arthritis ([place]) 08/19/2013  . High risk medication use 08/19/2013  . BPH (benign prostatic hyperplasia) 08/19/2013  . Seizure disorder ([place]) 05/06/2013  . Acute gangrenous cholecystitis 04/28/2013  . Abnormal EKG 04/22/2012  . Hyperlipidemia   . Depressive disorder, not elsewhere classified   . Localization-related (focal) (partial) epilepsy and epileptic syndromes with complex partial seizures, without mention of intractable epilepsy   . Kidney stone   . Impotence of organic origin   . External hemorrhoid   . Nasal septal perforation   . Cataract   . Hyperplasia of prostate     PORTER,DABNEY 10/29/2016, 7:46 PM  [facility] [address] [place], [place], [address] Phone: [phone]   Fax:  [phone]   Name: Mung Rinker. MRN: [id number] Date of Birth: 1945/11/03

## 2016-10-30 ENCOUNTER — Telehealth: Payer: Self-pay

## 2016-10-30 NOTE — Telephone Encounter (Signed)
-----   Message from Levin Erp, [place] sent at 10/30/2016  9:32 AM EDT ----- Regarding: ENT eval Can you make sure pt has ENT referral placed for reasons listed below-it may have already been done by Dr. [name]. Thanks-JLL  ----- Message ----- From: Ephraim Hamburger, CCC-SLP Sent: 10/29/2016   7:40 PM To: Chipper Herb, MD, [place] Erp, [place]  TO: Redge Gainer, MD and CC: Levin Erp, [place] RE: [facility] for Troy Rodgers. Recommend ENT consult to evaluate vocal folds due to breathiness, reduced vocal intensity, mild dysphonia, reflux, and suspected vocal fold adduction. SLP will see for dysphagia intervention as well, but may need ENT input regarding voice.  Thank you,  Genene Churn, CCC-SLP [phone]

## 2016-10-31 NOTE — Telephone Encounter (Signed)
Pt has been scheduled for ENT appt for 11/01/16 with Dr Lucia Gaskins.

## 2016-11-19 ENCOUNTER — Encounter (HOSPITAL_COMMUNITY): Payer: Self-pay | Admitting: Speech Pathology

## 2016-11-19 ENCOUNTER — Ambulatory Visit (HOSPITAL_COMMUNITY): Payer: Medicare Other | Attending: Physician Assistant | Admitting: Speech Pathology

## 2016-11-19 DIAGNOSIS — R1312 Dysphagia, oropharyngeal phase: Secondary | ICD-10-CM | POA: Diagnosis not present

## 2016-11-19 NOTE — Therapy (Signed)
[place], [place], [address] Phone: [phone]   Fax:  [phone]  Speech Language Pathology Treatment  Patient Details  Name: Troy Rodgers. MRN: [id number] Date of Birth: Nov 14, 1945 No Data Recorded  Encounter Date: 11/19/2016      End of Session - 11/19/16 1734    Visit Number 2   Number of Visits 4   Authorization Type [employer] Medicare   SLP Start Time 1430   SLP Stop Time  0300   SLP Time Calculation (min) 44 min   Activity Tolerance Patient tolerated treatment well      Past Medical History:  Diagnosis Date  . Abdominal pain   . Arthritis   . Asthma   . Bradycardia   . Cataract   . Chest pain   . Cholelithiases   . Depressive disorder, not elsewhere classified   . External hemorrhoid   . HLD (hyperlipidemia)   . Hyperplasia of prostate   . Impotence of organic origin   . Kidney stone   . Localization-related (focal) (partial) epilepsy and epileptic syndromes with complex partial seizures, without mention of intractable epilepsy   . Nasal septal perforation   . Other and unspecified hyperlipidemia   . Seizures ([place])   . Thyroid disease     Past Surgical History:  Procedure Laterality Date  . CHOLECYSTECTOMY  04/28/2013   Procedure: LAPAROSCOPIC CHOLECYSTECTOMY;  Surgeon: Ralene Ok, MD;  Location: [facility];  Service: General;;  . LEFT SHOULDER SURGERY  1996    There were no vitals filed for this visit.      Subjective Assessment - 11/19/16 1732    Subjective "I have been doing my exercises at home."   Patient is accompained by: Family member   Currently in Pain? No/denies           ADULT SLP TREATMENT - 11/19/16 0001      General Information   Behavior/Cognition Alert;Cooperative;Pleasant mood   Patient Positioning Upright in chair   Oral care provided N/A   HPI Troy Rodgers is a 71 year old Caucasian male with a past medical history of arthritis, seizures and thyroid  disease who was referred to SLP by Troy Rodgers from [place] for a complaint of dysphagia. She evaluated the Pt in her office in March following a referral from Troy Rodgers. The Pt and wife share that 4 years ago the patient had similar symptoms where he felt like food was getting stuck on its way down his throat. He had EGD in [place] 08/24/12-EGD-impression: Mild gastritis in the antrum, inflammation at the gastroesophageal junction; recommendations: Proton pump inhibitor 20 mg once a day if symptoms persisted consider motility study. They placed him on Omeprazole 20 mg daily and his symptoms seemed to be somewhat better. Over the past 2-3 years though he has had an increase in symptoms and now describes that even drinking liquids can sometimes get "hung up in his throat". This also occurs with his pills and most of the food that he eats on an everyday basis. Per his wife the patient was recently seen in the ED in [place] and had a CT of the neck which she says was normal. Patient denies any heartburn, reflux or abdominal pain.  Patient does have a history of epilepsy and has at least 5-6 petit mal seizures per month regardless of medicine. Pt had barium swallow 10/07/2016 which showed: Silent laryngeal penetration to the level of the vocal cords.Diminished esophageal motility with  a poor secondary stripping wave with occasional tertiary contractions in the mid and distal esophagus. No stricture or mass.  No esophagitis.     Treatment Provided   Treatment provided Dysphagia     Dysphagia Treatment   Temperature Spikes Noted No   Respiratory Status Room air   Oral Cavity - Dentition Adequate natural dentition   Treatment Methods Skilled observation;Therapeutic exercise;Compensation strategy training;Patient/caregiver education   Patient observed directly with PO's Yes   Type of PO's observed Thin liquids   Feeding Able to feed self   Liquids provided via Cup   Amount of cueing Independent      Pain Assessment   Pain Assessment No/denies pain     Assessment / Recommendations / Plan   Plan Discharge SLP treatment due to (comment);All goals met     Dysphagia Recommendations   Diet recommendations Regular;Thin liquid   Liquids provided via Cup   Medication Administration Whole meds with puree   Supervision Patient able to self feed   Compensations Multiple dry swallows after each bite/sip;Effortful swallow   Postural Changes and/or Swallow Maneuvers Out of bed for meals;Seated upright 90 degrees;Upright 30-60 min after meal     General Recommendations   Oral Care Recommendations Oral care BID;Patient independent with oral care     Progression Toward Goals   Progression toward goals Goals met, education completed, patient discharged from [facility] - 11/19/16 1733    Education provided Yes   Education Details Pt to continue with HEP at home going forward and to call SLP if questions or concerns arise.   Person(s) Educated Patient;Spouse   Methods Explanation;Handout   Comprehension Verbalized understanding          SLP Short Term Goals - 11/19/16 1735      SLP SHORT TERM GOAL #1   Title Pt will complete lingual and pharyngeal exercises x 10 each with indirect cues from SLP   Baseline Able to complete effortful swallow with verbal cues   Time 4   Period Weeks   Status Partially Met     SLP SHORT TERM GOAL #2   Title Pt will implement recommended compensatory swallow strategies during po consumption with indirect cues.   Baseline mod cues   Time 4   Period Weeks   Status Achieved     SLP SHORT TERM GOAL #3   Title Pt will complete vocal fold adduction and resonant voice therapy exercises x 10 each with direct instruction from SLP   Baseline novel task   Time 4   Period Weeks   Status Partially Met     SLP SHORT TERM GOAL #4   Title Pt will seen ENT consult to evaluate vocal folds due to breathy vocal quality   Status Achieved           SLP Long Term Goals - 10/29/16 1945      SLP LONG TERM GOAL #1   Title Same as short          Plan - 11/19/16 1735    Clinical Impression Statement Pt accompanied by his wife today. She reported that they saw ENT on April 20 and that "everything was normal". She also stated that he was going to fax report to me, but I have not received. Pt states that his swallowing is "fine', however his wife does indicate occasional trouble with coughing (french fries, tortilla chips, and once without food/drink). Pt is completing  his exercises at home and is satisfied with his current level of function. SLP reviewed breath support and improving vocal intensity with pt/spouse. Recording of Pt made (one with use of strategies and one without) and Pt/spouse listened following. He was able to identify improvement in loudness, but is not motivated to address in therapy at this time. His wife did endorse mild frustration with his reduced loudness at times (family voices the same when they visit). SLP discharged Pt with home exercise program and strategies to implement. Will d/c from therapy at this time. Pt/spouse have my contact information if they have further questions or concerns.    Speech Therapy Frequency 1x /week   Duration 4 weeks   Treatment/Interventions Aspiration precaution training;Pharyngeal strengthening exercises;Diet toleration management by SLP;Compensatory techniques;SLP instruction and feedback;Patient/family education;Compensatory strategies   Potential to Achieve Goals Good   Potential Considerations Previous level of function   SLP Home Exercise Plan Pt will complete HEP as assigned to facilitate carryover of treatment exercises in home environment.   Consulted and Agree with Plan of Care Patient;Family member/caregiver   Family Member Consulted Spouse      Patient will benefit from skilled therapeutic intervention in order to improve the following deficits and impairments:    Dysphagia, oropharyngeal phase      G-Codes - 20-Nov-2016 1735    Functional Assessment Tool Used clinical judgment   Functional Limitations Swallowing   Swallow Goal Status (W8889) At least 1 percent but less than 20 percent impaired, limited or restricted   Swallow Discharge Status [phone]) At least 1 percent but less than 20 percent impaired, limited or restricted      Problem List Patient Active Problem List   Diagnosis Date Noted  . Epilepsy ([place]) 11/06/2015  . Hypothyroidism 05/04/2014  . Solitary pulmonary nodule RLL lateral basal segment  10/12/2013  . Rheumatoid arthritis ([place]) 08/19/2013  . High risk medication use 08/19/2013  . BPH (benign prostatic hyperplasia) 08/19/2013  . Seizure disorder ([place]) 05/06/2013  . Acute gangrenous cholecystitis 04/28/2013  . Abnormal EKG 04/22/2012  . Hyperlipidemia   . Depressive disorder, not elsewhere classified   . Localization-related (focal) (partial) epilepsy and epileptic syndromes with complex partial seizures, without mention of intractable epilepsy   . Kidney stone   . Impotence of organic origin   . External hemorrhoid   . Nasal septal perforation   . Cataract   . Hyperplasia of prostate     SPEECH THERAPY DISCHARGE SUMMARY  Visits from Start of Care: 2  Current functional level related to goals / functional outcomes: Pt independent with HEP   Remaining deficits: Mild dysphonia and dysphagia   Education / Equipment: Released to HEP per Pt request  Plan: Patient agrees to discharge.  Patient goals were partially met. Patient is being discharged due to meeting the stated rehab goals.  ?????        Thank you,  Genene Churn, [place]  [facility] 2016-11-20, 5:36 PM  [place] [address] [place], [place], [address] Phone: [phone]   Fax:  [phone]   Name: Troy Rodgers. MRN: [id number] Date of Birth: [birth year]

## 2016-12-25 ENCOUNTER — Ambulatory Visit: Payer: Medicare Other | Admitting: Family Medicine

## 2016-12-26 ENCOUNTER — Encounter: Payer: Self-pay | Admitting: Family Medicine

## 2016-12-26 ENCOUNTER — Telehealth: Payer: Self-pay | Admitting: Family Medicine

## 2016-12-26 ENCOUNTER — Ambulatory Visit (INDEPENDENT_AMBULATORY_CARE_PROVIDER_SITE_OTHER): Payer: Medicare Other | Admitting: Family Medicine

## 2016-12-26 VITALS — BP 101/61 | HR 60 | Temp 97.6°F | Ht 64.67 in | Wt 154.0 lb

## 2016-12-26 DIAGNOSIS — N400 Enlarged prostate without lower urinary tract symptoms: Secondary | ICD-10-CM

## 2016-12-26 DIAGNOSIS — M069 Rheumatoid arthritis, unspecified: Secondary | ICD-10-CM | POA: Diagnosis not present

## 2016-12-26 DIAGNOSIS — E559 Vitamin D deficiency, unspecified: Secondary | ICD-10-CM

## 2016-12-26 DIAGNOSIS — R938 Abnormal findings on diagnostic imaging of other specified body structures: Secondary | ICD-10-CM | POA: Diagnosis not present

## 2016-12-26 DIAGNOSIS — Z1159 Encounter for screening for other viral diseases: Secondary | ICD-10-CM | POA: Diagnosis not present

## 2016-12-26 DIAGNOSIS — E7800 Pure hypercholesterolemia, unspecified: Secondary | ICD-10-CM

## 2016-12-26 DIAGNOSIS — E039 Hypothyroidism, unspecified: Secondary | ICD-10-CM | POA: Diagnosis not present

## 2016-12-26 DIAGNOSIS — G40909 Epilepsy, unspecified, not intractable, without status epilepticus: Secondary | ICD-10-CM | POA: Diagnosis not present

## 2016-12-26 DIAGNOSIS — R9389 Abnormal findings on diagnostic imaging of other specified body structures: Secondary | ICD-10-CM

## 2016-12-26 NOTE — Telephone Encounter (Signed)
Please advise 

## 2016-12-26 NOTE — Telephone Encounter (Signed)
Wife aware that I never received a letter with a order attached to it today when he came in.  He thought he handed it to me.  There was a order for labs from [facility]  - I will call his office and see what he needs [place] to draw.

## 2016-12-26 NOTE — Progress Notes (Signed)
Subjective:    Patient ID: Troy Rodgers., male    DOB: [birth year], 71 y.o.   MRN: [id number]  HPI Pt here for follow up and management of chronic medical problems which includes hyperlipidemia and seizure disorder. He is taking medication regularly.The patient is doing well overall. He is followed by the neurologist because of his seizure disorder. He is also followed by the rheumatologist because of his rheumatoid arthritis. The patient just saw the rheumatologist and he is taking methotrexate and his joints are doing great. He has not had any seizure episodes and the neurologist checks his drug levels when he sees him. He sees the rheumatologist every 6 months. The patient denies any chest pain or shortness of breath. He denies any problems with nausea vomiting diarrhea blood in the stool or black tarry bowel movements. He's passing his water without problems. He sees the neurologist every 4 months. His weight is down about 4 pounds since the last visit. In walking and exercising he has no complaints.    Patient Active Problem List   Diagnosis Date Noted  . Epilepsy ([place]) 11/06/2015  . Hypothyroidism 05/04/2014  . Solitary pulmonary nodule RLL lateral basal segment  10/12/2013  . Rheumatoid arthritis ([place]) 08/19/2013  . High risk medication use 08/19/2013  . BPH (benign prostatic hyperplasia) 08/19/2013  . Seizure disorder ([place]) 05/06/2013  . Acute gangrenous cholecystitis 04/28/2013  . Abnormal EKG 04/22/2012  . Hyperlipidemia   . Depressive disorder, not elsewhere classified   . Localization-related (focal) (partial) epilepsy and epileptic syndromes with complex partial seizures, without mention of intractable epilepsy   . Kidney stone   . Impotence of organic origin   . External hemorrhoid   . Nasal septal perforation   . Cataract   . Hyperplasia of prostate    Outpatient Encounter Prescriptions as of 12/26/2016  Medication Sig  . aspirin 81 MG EC tablet Take 81 mg by mouth  daily.    Marland Kitchen atorvastatin (LIPITOR) 40 MG tablet TAKE 1 TABLET EVERY EVENING AS DIRECTED  . Cholecalciferol (VITAMIN D3) 2000 UNITS TABS Take 1 tablet twice Mon-Fri and 2 twice a day on Sat and Sun  . escitalopram (LEXAPRO) 20 MG tablet TAKE 1 TABLET DAILY  . ezetimibe (ZETIA) 10 MG tablet TAKE ONE TABLET BY MOUTH DAILY FOR HYPERLIPIDERMEA (Patient taking differently: TAKE 1/2 TABLET BY MOUTH DAILY FOR HYPERLIPIDERMEA)  . folic acid (FOLVITE) 1 MG tablet Take 1 mg by mouth 2 (two) times daily.   Marland Kitchen guaiFENesin (MUCINEX) 600 MG 12 hr tablet Take 600 mg by mouth as needed.   . lacosamide (VIMPAT) 200 MG TABS tablet Take 200 mg by mouth 2 (two) times daily.  Marland Kitchen levothyroxine (SYNTHROID, LEVOTHROID) 50 MCG tablet Take 1 tablet (50 mcg total) by mouth daily.  . methotrexate (RHEUMATREX) 2.5 MG tablet Take 10 mg by mouth once a week.   . omega-3 fish oil (MAXEPA) 1000 MG CAPS capsule Take 2 capsules by mouth daily.  Marland Kitchen omeprazole (PRILOSEC) 20 MG capsule Take 20 mg by mouth daily.   . primidone (MYSOLINE) 250 MG tablet 1 in the am and 1 1/2 at bedtime  . [DISCONTINUED] predniSONE (DELTASONE) 20 MG tablet Take 3 tabs daily for 1 week, then 2 tabs daily for week 2, then 1 tab daily for week 3.   No facility-administered encounter medications on file as of 12/26/2016.       Review of Systems  Constitutional: Negative.   HENT: Negative.   Eyes: Negative.  Respiratory: Negative.   Cardiovascular: Negative.   Gastrointestinal: Negative.   Endocrine: Negative.   Genitourinary: Negative.   Musculoskeletal: Negative.   Skin: Negative.   Allergic/Immunologic: Negative.   Neurological: Negative.   Hematological: Negative.   Psychiatric/Behavioral: Negative.        Objective:   Physical Exam  Constitutional: He is oriented to person, place, and time. He appears well-developed and well-nourished. No distress.  The patient is pleasant and alert and hearing impaired  HENT:  Head: Normocephalic and  atraumatic.  Right Ear: External ear normal.  Left Ear: External ear normal.  Nose: Nose normal.  Mouth/Throat: Oropharynx is clear and moist. No oropharyngeal exudate.  Bilateral hearing aids but ear canals are clear of cerumen  Eyes: Conjunctivae and EOM are normal. Pupils are equal, round, and reactive to light. Right eye exhibits no discharge. Left eye exhibits no discharge. No scleral icterus.  Recent eye exam would change in glasses. He'll make sure that we get a copy of the report  Neck: Normal range of motion. Neck supple. No thyromegaly present.  No bruits thyromegaly or anterior cervical adenopathy  Cardiovascular: Normal rate, regular rhythm, normal heart sounds and intact distal pulses.   No murmur heard. The heart is regular at 60/m  Pulmonary/Chest: Effort normal and breath sounds normal. No respiratory distress. He has no wheezes. He has no rales. He exhibits no tenderness.  Clear anteriorly and posteriorly and no axillary adenopathy  Abdominal: Soft. Bowel sounds are normal. He exhibits no mass. There is no tenderness. There is no rebound and no guarding.  No liver or spleen enlargement no inguinal adenopathy no masses and no bruits.  Musculoskeletal: Normal range of motion. He exhibits no edema.  Good mobility all of the patient complains of some back pain with walking up inclines.  Lymphadenopathy:    He has no cervical adenopathy.  Neurological: He is alert and oriented to person, place, and time. He has normal reflexes. No cranial nerve deficit.  Skin: Skin is warm and dry. No rash noted.  Psychiatric: He has a normal mood and affect. His behavior is normal. Judgment and thought content normal.  Nursing note and vitals reviewed.   BP 101/61 (BP Location: Left Arm)   Pulse 60   Temp 97.6 F (36.4 C) (Oral)   Ht 5' 4.67" (1.643 m)   Wt 154 lb (69.9 kg)   BMI 25.89 kg/m       Assessment & Plan:  1. Pure hypercholesterolemia -The patient will continue with  aggressive therapeutic lifestyle changes and current treatment pending results of lab work - CBC with Differential/Platelet - BMP8+EGFR - Hepatic function panel - Lipid panel  2. Vitamin D deficiency -Continue with current treatment pending results of lab work - CBC with Differential/Platelet - VITAMIN D 25 Hydroxy (Vit-D Deficiency, Fractures)  3. Seizure disorder ([place]) -Continue to follow-up with neurology - CBC with Differential/Platelet  4. Benign prostatic hyperplasia, unspecified whether lower urinary tract symptoms present -No complaints with voiding today. - CBC with Differential/Platelet  5. Hypothyroidism, unspecified type -Continue current thyroid treatment pending results of lab work - CBC with Differential/Platelet - Thyroid Panel With TSH  6. Encounter for hepatitis C screening test for low risk patient - Hepatitis C antibody  7. Rheumatoid arthritis involving multiple sites, unspecified rheumatoid factor presence ([place]) -Continue to follow-up with rheumatology  8. Abnormal chest CT -We will make sure that no further follow-up is necessary cause of this past abnormal chest CT.  Patient Instructions  Medicare Annual Wellness Visit  [facility] and the medical providers at [facility] strive to bring you the best medical care.  In doing so we not only want to address your current medical conditions and concerns but also to detect new conditions early and prevent illness, disease and health-related problems.    Medicare offers a yearly Wellness Visit which allows our clinical staff to assess your need for preventative services including immunizations, lifestyle education, counseling to decrease risk of preventable diseases and screening for fall risk and other medical concerns.    This visit is provided free of charge (no copay) for all Medicare recipients. The clinical pharmacists at [facility] have  begun to conduct these Wellness Visits which will also include a thorough review of all your medications.    As you primary medical provider recommend that you make an appointment for your Annual Wellness Visit if you have not done so already this year.  You may set up this appointment before you leave today or you may call back ([phone]) and schedule an appointment.  Please make sure when you call that you mention that you are scheduling your Annual Wellness Visit with the clinical pharmacist so that the appointment may be made for the proper length of time.     Continue current medications. Continue good therapeutic lifestyle changes which include good diet and exercise. Fall precautions discussed with patient. If an FOBT was given today- please return it to our front desk. If you are over 65 years old - you may need Prevnar 92 or the adult Pneumonia vaccine.  **Flu shots are available--- please call and schedule a FLU-CLINIC appointment**  After your visit with [place] today you will receive a survey in the mail or online from [employer] regarding your care with [place]. Please take a moment to fill this out. Your feedback is very important to [place] as you can help [place] better understand your patient needs as well as improve your experience and satisfaction. WE CARE ABOUT YOU!!!   Follow-up regularly with the rheumatologist and with the neurologist Stay active physically but avoid excessive activity in the heat of the summer Drink plenty of water and stay well hydrated We will call with lab work results as soon as they become available    Arrie Senate MD

## 2016-12-26 NOTE — Patient Instructions (Addendum)
Medicare Annual Wellness Visit  [facility] and the medical providers at [facility] strive to bring you the best medical care.  In doing so we not only want to address your current medical conditions and concerns but also to detect new conditions early and prevent illness, disease and health-related problems.    Medicare offers a yearly Wellness Visit which allows our clinical staff to assess your need for preventative services including immunizations, lifestyle education, counseling to decrease risk of preventable diseases and screening for fall risk and other medical concerns.    This visit is provided free of charge (no copay) for all Medicare recipients. The clinical pharmacists at [facility] have begun to conduct these Wellness Visits which will also include a thorough review of all your medications.    As you primary medical provider recommend that you make an appointment for your Annual Wellness Visit if you have not done so already this year.  You may set up this appointment before you leave today or you may call back ([phone]) and schedule an appointment.  Please make sure when you call that you mention that you are scheduling your Annual Wellness Visit with the clinical pharmacist so that the appointment may be made for the proper length of time.     Continue current medications. Continue good therapeutic lifestyle changes which include good diet and exercise. Fall precautions discussed with patient. If an FOBT was given today- please return it to our front desk. If you are over 9 years old - you may need Prevnar 16 or the adult Pneumonia vaccine.  **Flu shots are available--- please call and schedule a FLU-CLINIC appointment**  After your visit with [place] today you will receive a survey in the mail or online from [employer] regarding your care with [place]. Please take a moment to fill this out. Your feedback is very  important to [place] as you can help [place] better understand your patient needs as well as improve your experience and satisfaction. WE CARE ABOUT YOU!!!   Follow-up regularly with the rheumatologist and with the neurologist Stay active physically but avoid excessive activity in the heat of the summer Drink plenty of water and stay well hydrated We will call with lab work results as soon as they become available

## 2016-12-27 LAB — LIPID PANEL
CHOLESTEROL TOTAL: 133 mg/dL (ref 100–199)
Chol/HDL Ratio: 2.4 ratio (ref 0.0–5.0)
HDL: 56 mg/dL (ref >39)
LDL Calculated: 60 mg/dL (ref 0–99)
Triglycerides: 86 mg/dL (ref 0–149)
VLDL Cholesterol Cal: 17 mg/dL (ref 5–40)

## 2016-12-27 LAB — HEPATIC FUNCTION PANEL
ALK PHOS: 79 IU/L (ref 39–117)
ALT: 21 IU/L (ref 0–44)
AST: 32 IU/L (ref 0–40)
Albumin: 4.3 g/dL (ref 3.5–4.8)
Bilirubin Total: 0.2 mg/dL (ref 0.0–1.2)
Bilirubin, Direct: 0.06 mg/dL (ref 0.00–0.40)
Total Protein: 6.8 g/dL (ref 6.0–8.5)

## 2016-12-27 LAB — BMP8+EGFR
BUN / CREAT RATIO: 11 (ref 10–24)
BUN: 12 mg/dL (ref 8–27)
CO2: 27 mmol/L (ref 20–29)
CREATININE: 1.08 mg/dL (ref 0.76–1.27)
Calcium: 9.2 mg/dL (ref 8.6–10.2)
Chloride: 99 mmol/L (ref 96–106)
GFR calc Af Amer: 79 mL/min/{1.73_m2} (ref >59)
GFR, EST NON AFRICAN AMERICAN: 69 mL/min/{1.73_m2} (ref >59)
GLUCOSE: 87 mg/dL (ref 65–99)
Potassium: 5 mmol/L (ref 3.5–5.2)
SODIUM: 139 mmol/L (ref 134–144)

## 2016-12-27 LAB — VITAMIN D 25 HYDROXY (VIT D DEFICIENCY, FRACTURES): VIT D 25 HYDROXY: 51.9 ng/mL (ref 30.0–100.0)

## 2016-12-27 LAB — CBC WITH DIFFERENTIAL/PLATELET
BASOS ABS: 0.1 10*3/uL (ref 0.0–0.2)
Basos: 1 %
EOS (ABSOLUTE): 0.1 10*3/uL (ref 0.0–0.4)
Eos: 1 %
HEMATOCRIT: 34.2 % — AB (ref 37.5–51.0)
HEMOGLOBIN: 11.2 g/dL — AB (ref 13.0–17.7)
IMMATURE GRANS (ABS): 0 10*3/uL (ref 0.0–0.1)
Immature Granulocytes: 0 %
LYMPHS: 29 %
Lymphocytes Absolute: 2.1 10*3/uL (ref 0.7–3.1)
MCH: 30.6 pg (ref 26.6–33.0)
MCHC: 32.7 g/dL (ref 31.5–35.7)
MCV: 93 fL (ref 79–97)
Monocytes Absolute: 1 10*3/uL — ABNORMAL HIGH (ref 0.1–0.9)
Monocytes: 14 %
NEUTROS ABS: 3.9 10*3/uL (ref 1.4–7.0)
NEUTROS PCT: 55 %
Platelets: 319 10*3/uL (ref 150–379)
RBC: 3.66 x10E6/uL — ABNORMAL LOW (ref 4.14–5.80)
RDW: 15.7 % — ABNORMAL HIGH (ref 12.3–15.4)
WBC: 7.1 10*3/uL (ref 3.4–10.8)

## 2016-12-27 LAB — THYROID PANEL WITH TSH
Free Thyroxine Index: 1.7 (ref 1.2–4.9)
T3 UPTAKE RATIO: 25 % (ref 24–39)
T4, Total: 6.7 ug/dL (ref 4.5–12.0)
TSH: 2.71 u[IU]/mL (ref 0.450–4.500)

## 2016-12-27 LAB — HEPATITIS C ANTIBODY

## 2016-12-30 ENCOUNTER — Other Ambulatory Visit: Payer: Medicare Other

## 2016-12-30 DIAGNOSIS — Z1211 Encounter for screening for malignant neoplasm of colon: Secondary | ICD-10-CM

## 2017-01-01 LAB — FECAL OCCULT BLOOD, IMMUNOCHEMICAL: Fecal Occult Bld: NEGATIVE

## 2017-02-19 ENCOUNTER — Other Ambulatory Visit: Payer: Self-pay | Admitting: Family Medicine

## 2017-02-21 ENCOUNTER — Other Ambulatory Visit: Payer: Self-pay | Admitting: Family Medicine

## 2017-03-02 ENCOUNTER — Other Ambulatory Visit: Payer: Self-pay | Admitting: Family Medicine

## 2017-03-23 ENCOUNTER — Other Ambulatory Visit: Payer: Self-pay | Admitting: Family Medicine

## 2017-04-24 IMAGING — DX DG CHEST 2V
2 series · 2 of 2 positions shown · non-contrast
Comparison: None.

CLINICAL DATA: Hypercholesterolemia.

EXAM:
CHEST  2 VIEW

[chest pa]
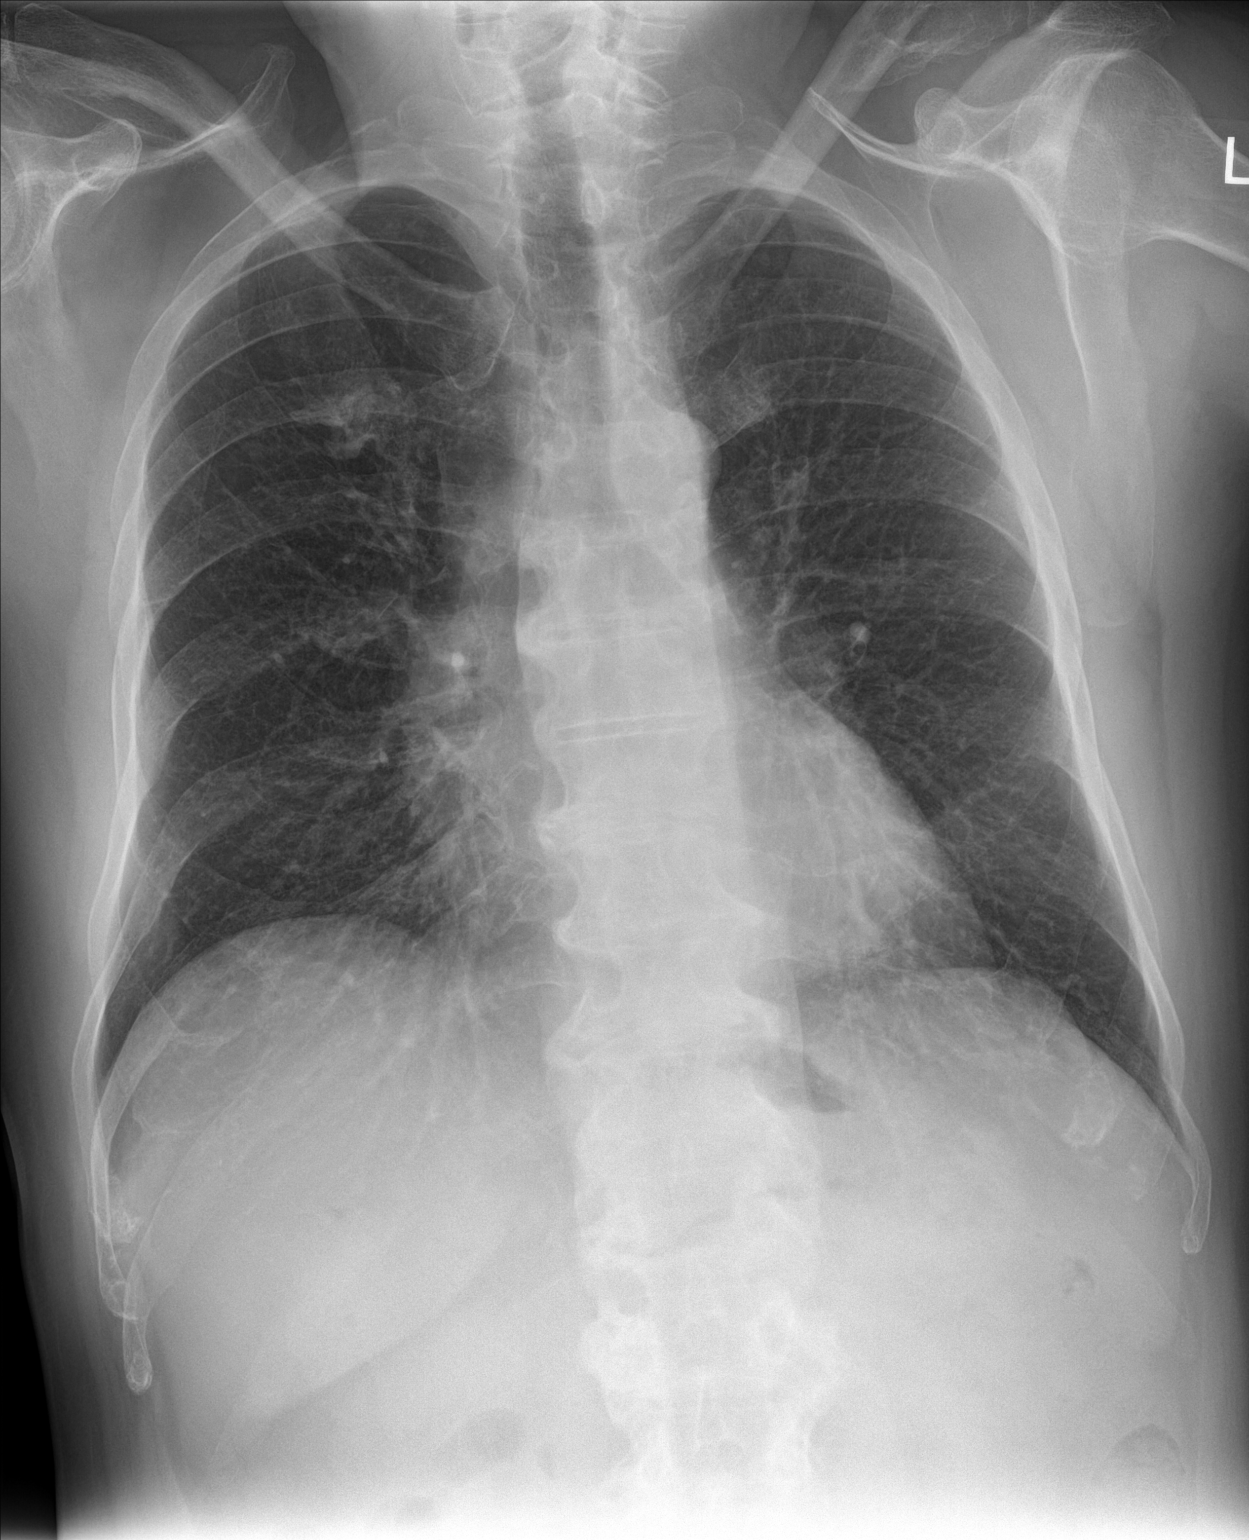

[chest lat]
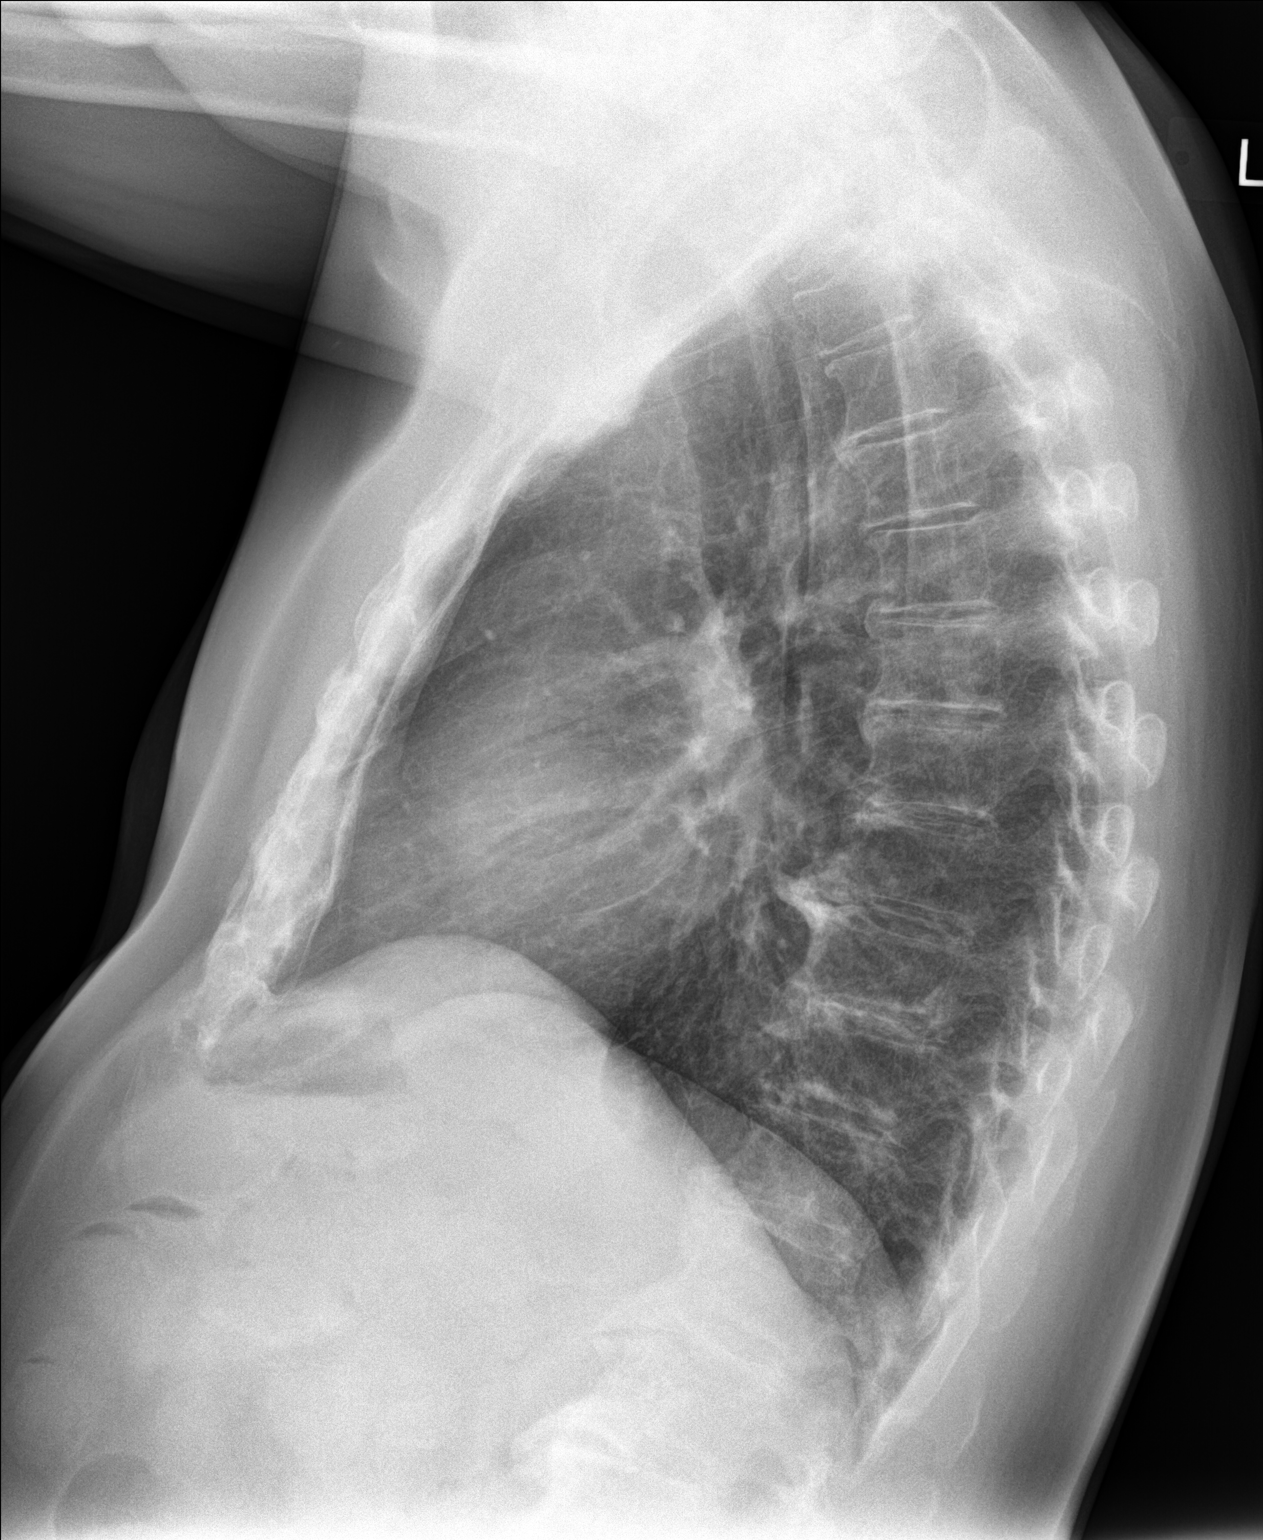

[2 of 2 positions shown; findings below may reference images not displayed]

FINDINGS: Lungs are clear. Heart size is normal. No pneumothorax or pleural
effusion. No acute bony abnormality. Thoracic spondylosis noted.
IMPRESSION: No acute disease.

## 2017-04-27 ENCOUNTER — Other Ambulatory Visit: Payer: Self-pay | Admitting: Family Medicine

## 2017-05-07 ENCOUNTER — Ambulatory Visit (INDEPENDENT_AMBULATORY_CARE_PROVIDER_SITE_OTHER): Payer: Medicare Other | Admitting: Family Medicine

## 2017-05-07 ENCOUNTER — Encounter: Payer: Self-pay | Admitting: Family Medicine

## 2017-05-07 VITALS — BP 107/57 | HR 60 | Temp 96.8°F | Ht 64.56 in | Wt 154.0 lb

## 2017-05-07 DIAGNOSIS — E7800 Pure hypercholesterolemia, unspecified: Secondary | ICD-10-CM | POA: Diagnosis not present

## 2017-05-07 DIAGNOSIS — E039 Hypothyroidism, unspecified: Secondary | ICD-10-CM | POA: Diagnosis not present

## 2017-05-07 DIAGNOSIS — R7989 Other specified abnormal findings of blood chemistry: Secondary | ICD-10-CM

## 2017-05-07 DIAGNOSIS — N400 Enlarged prostate without lower urinary tract symptoms: Secondary | ICD-10-CM | POA: Diagnosis not present

## 2017-05-07 DIAGNOSIS — E559 Vitamin D deficiency, unspecified: Secondary | ICD-10-CM | POA: Diagnosis not present

## 2017-05-07 DIAGNOSIS — Z23 Encounter for immunization: Secondary | ICD-10-CM

## 2017-05-07 DIAGNOSIS — M069 Rheumatoid arthritis, unspecified: Secondary | ICD-10-CM | POA: Diagnosis not present

## 2017-05-07 DIAGNOSIS — Z0000 Encounter for general adult medical examination without abnormal findings: Secondary | ICD-10-CM

## 2017-05-07 DIAGNOSIS — G40909 Epilepsy, unspecified, not intractable, without status epilepticus: Secondary | ICD-10-CM

## 2017-05-07 DIAGNOSIS — R9389 Abnormal findings on diagnostic imaging of other specified body structures: Secondary | ICD-10-CM | POA: Diagnosis not present

## 2017-05-07 DIAGNOSIS — R918 Other nonspecific abnormal finding of lung field: Secondary | ICD-10-CM | POA: Diagnosis not present

## 2017-05-07 DIAGNOSIS — R945 Abnormal results of liver function studies: Secondary | ICD-10-CM

## 2017-05-07 NOTE — Addendum Note (Signed)
Addended by: Zannie Cove on: 05/07/2017 10:22 AM   Modules accepted: Orders

## 2017-05-07 NOTE — Progress Notes (Signed)
Subjective:    Patient ID: Troy Genre., male    DOB: Jun 18, 1946, 71 y.o.   MRN: [id number]  HPI Pt here for follow up and management of chronic medical problems which includes hyperlipidemia and hypothyroid. He is taking medication regularly.  The patient is doing well overall.  He is due for a rectal exam today.  He is followed by the neurologist because of his seizure disorder and the rheumatologist because of his rheumatoid arthritis.  He has no specific complaints.  He will get lab work today his flu shot today and a urinalysis along with an EKG.  Patient also has a history of pulmonary nodules and I cannot tell by the computer record if he has had a repeat chest CT without contrast done or not.  He has seen the pulmonologist.  He saw Dr. Melvyn Novas.  X-ray in January was normal.  The patient is doing fairly well.  He has no specific complaints.  He sees the neurologist about every 6 months and the rheumatologist every 6 months.  He does not recall any visits to the pulmonologist and why there was not a repeat CT scan for the pulmonary nodules.  We will look into this further and make sure that proper follow-up has happened.  He denies any chest pain or shortness of breath.  He denies any trouble with nausea vomiting diarrhea blood in the stool or black tarry bowel movements.  He had a colonoscopy in October 2015.  He is passing his water without problems.    Patient Active Problem List   Diagnosis Date Noted  . Epilepsy ([place]) 11/06/2015  . Hypothyroidism 05/04/2014  . Solitary pulmonary nodule RLL lateral basal segment  10/12/2013  . Rheumatoid arthritis ([place]) 08/19/2013  . High risk medication use 08/19/2013  . BPH (benign prostatic hyperplasia) 08/19/2013  . Seizure disorder ([place]) 05/06/2013  . Acute gangrenous cholecystitis 04/28/2013  . Abnormal EKG 04/22/2012  . Hyperlipidemia   . Depressive disorder, not elsewhere classified   . Localization-related (focal) (partial) epilepsy and  epileptic syndromes with complex partial seizures, without mention of intractable epilepsy   . Kidney stone   . Impotence of organic origin   . External hemorrhoid   . Nasal septal perforation   . Cataract   . Hyperplasia of prostate    Outpatient Encounter Prescriptions as of 05/07/2017  Medication Sig  . aspirin 81 MG EC tablet Take 81 mg by mouth daily.    Marland Kitchen atorvastatin (LIPITOR) 40 MG tablet TAKE 1 TABLET EVERY EVENING AS DIRECTED  . Cholecalciferol (VITAMIN D3) 2000 UNITS TABS Take 1 tablet twice Mon-Fri and 2 twice a day on Sat and Sun  . escitalopram (LEXAPRO) 20 MG tablet TAKE 1 TABLET DAILY  . ezetimibe (ZETIA) 10 MG tablet TAKE ONE TABLET BY MOUTH DAILY FOR HYPERLIPIDERMEA  . folic acid (FOLVITE) 1 MG tablet Take 1 mg by mouth 2 (two) times daily.   Marland Kitchen guaiFENesin (MUCINEX) 600 MG 12 hr tablet Take 600 mg by mouth as needed.   . lacosamide (VIMPAT) 200 MG TABS tablet Take 200 mg by mouth 2 (two) times daily.  Marland Kitchen levothyroxine (SYNTHROID, LEVOTHROID) 50 MCG tablet TAKE 1 TABLET (50 MCG TOTAL) BY MOUTH DAILY.  . methotrexate (RHEUMATREX) 2.5 MG tablet Take 10 mg by mouth once a week.   . omega-3 fish oil (MAXEPA) 1000 MG CAPS capsule Take 2 capsules by mouth daily.  Marland Kitchen omeprazole (PRILOSEC) 20 MG capsule TAKE 1 CAPSULE BY MOUTH ONCE A  DAY  . primidone (MYSOLINE) 250 MG tablet 1 in the am and 1 1/2 at bedtime   No facility-administered encounter medications on file as of 05/07/2017.       Review of Systems  Constitutional: Negative.   HENT: Negative.   Eyes: Negative.   Respiratory: Negative.   Cardiovascular: Negative.   Gastrointestinal: Negative.   Endocrine: Negative.   Genitourinary: Negative.   Musculoskeletal: Negative.   Skin: Negative.   Allergic/Immunologic: Negative.   Neurological: Negative.   Hematological: Negative.   Psychiatric/Behavioral: Negative.        Objective:   Physical Exam  Constitutional: He is oriented to person, place, and time. He  appears well-developed and well-nourished. No distress.  Patient is pleasant calm and relaxed  HENT:  Head: Normocephalic and atraumatic.  Right Ear: External ear normal.  Left Ear: External ear normal.  Nose: Nose normal.  Mouth/Throat: Oropharynx is clear and moist. No oropharyngeal exudate.  Patient wears by lateral hearing aids  Eyes: Pupils are equal, round, and reactive to light. Conjunctivae and EOM are normal. Right eye exhibits no discharge. Left eye exhibits no discharge. No scleral icterus.  Neck: Normal range of motion. Neck supple. No thyromegaly present.  No bruits thyromegaly or anterior cervical adenopathy  Cardiovascular: Normal rate, regular rhythm, normal heart sounds and intact distal pulses.   No murmur heard. Heart is regular at 72/min  Pulmonary/Chest: Effort normal and breath sounds normal. No respiratory distress. He has no wheezes. He has no rales. He exhibits no tenderness.  Clear anteriorly and posteriorly with no axillary adenopathy  Abdominal: Soft. Bowel sounds are normal. He exhibits no mass. There is no tenderness. There is no rebound and no guarding.  No liver or spleen enlargement no epigastric tenderness no bruits no masses and no inguinal adenopathy  Genitourinary: Rectum normal and penis normal.  Genitourinary Comments: The prostate is minimally enlarged if any with no lumps or masses.  There were no rectal masses.  The external genitalia were within normal limits and no inguinal hernias were detected.  Musculoskeletal: Normal range of motion. He exhibits no edema.  Lymphadenopathy:    He has no cervical adenopathy.  Neurological: He is alert and oriented to person, place, and time. He has normal reflexes. No cranial nerve deficit.  Skin: Skin is warm and dry. No rash noted.  Psychiatric: He has a normal mood and affect. His behavior is normal. Judgment and thought content normal.  Nursing note and vitals reviewed.  BP (!) 107/57 (BP Location: Left  Arm)   Pulse 60   Temp (!) 96.8 F (36 C) (Oral)   Ht 5' 4.56" (1.64 m)   Wt 154 lb (69.9 kg)   BMI 25.98 kg/m   As of note, the neurologist does his levels for primidone and seizure medicines.      Assessment & Plan:  1. Annual physical exam -Patient has a history of pulmonary nodules, we need to make sure that he is getting his CT scans regularly and or following up with a pulmonologist if needed. - BMP8+EGFR - CBC with Differential/Platelet - Lipid panel - VITAMIN D 25 Hydroxy (Vit-D Deficiency, Fractures) - Thyroid Panel With TSH - Hepatic function panel - PSA, total and free - Urinalysis, Complete  2. Vitamin D deficiency -Continue current treatment pending results of lab work - CBC with Differential/Platelet - VITAMIN D 25 Hydroxy (Vit-D Deficiency, Fractures)  3. Pure hypercholesterolemia -Continue current treatment pending results of lab work - BMP8+EGFR - CBC with  Differential/Platelet - Lipid panel - Hepatic function panel  4. Benign prostatic hyperplasia, unspecified whether lower urinary tract symptoms present -The patient is having no symptoms with his prostate and no abnormal findings were noted on the exam today. - CBC with Differential/Platelet - PSA, total and free - Urinalysis, Complete  5. Seizure disorder [facility]) -He has had no seizure activity and sees the neurologist about every 6 months - CBC with Differential/Platelet  6. Hypothyroidism, unspecified type -Continue current treatment pending results of lab work - CBC with Differential/Platelet - Thyroid Panel With TSH  7. Rheumatoid arthritis involving multiple sites, unspecified rheumatoid factor presence ([place]) -Follow-up with rheumatologist as planned  8. Abnormal chest CT -Check regarding any further need for follow-up of this with CT scan are not??  9. Pulmonary nodules -Check regarding need for any further follow-up?  EKG with results pending===  Patient Instructions                        Medicare Annual Wellness Visit  [place] and the medical providers at [place] strive to bring you the best medical care.  In doing so we not only want to address your current medical conditions and concerns but also to detect new conditions early and prevent illness, disease and health-related problems.    Medicare offers a yearly Wellness Visit which allows our clinical staff to assess your need for preventative services including immunizations, lifestyle education, counseling to decrease risk of preventable diseases and screening for fall risk and other medical concerns.    This visit is provided free of charge (no copay) for all Medicare recipients. The clinical pharmacists at [facility] have begun to conduct these Wellness Visits which will also include a thorough review of all your medications.    As you primary medical provider recommend that you make an appointment for your Annual Wellness Visit if you have not done so already this year.  You may set up this appointment before you leave today or you may call back ([phone]) and schedule an appointment.  Please make sure when you call that you mention that you are scheduling your Annual Wellness Visit with the clinical pharmacist so that the appointment may be made for the proper length of time.     Continue current medications. Continue good therapeutic lifestyle changes which include good diet and exercise. Fall precautions discussed with patient. If an FOBT was given today- please return it to our front desk. If you are over 8 years old - you may need Prevnar 29 or the adult Pneumonia vaccine.  **Flu shots are available--- please call and schedule a FLU-CLINIC appointment**  After your visit with [place] today you will receive a survey in the mail or online from [employer] regarding your care with [place]. Please take a moment to fill this out. Your feedback is very important to [place]  as you can help [place] better understand your patient needs as well as improve your experience and satisfaction. WE CARE ABOUT YOU!!!   Follow-up with neurologist and rheumatologist as planned We will need to make sure that you are up-to-date on following the pulmonary nodules. We will call with your lab work results as soon as those results become available  Arrie Senate MD

## 2017-05-07 NOTE — Patient Instructions (Addendum)
Medicare Annual Wellness Visit  [facility] and the medical providers at [facility] strive to bring you the best medical care.  In doing so we not only want to address your current medical conditions and concerns but also to detect new conditions early and prevent illness, disease and health-related problems.    Medicare offers a yearly Wellness Visit which allows our clinical staff to assess your need for preventative services including immunizations, lifestyle education, counseling to decrease risk of preventable diseases and screening for fall risk and other medical concerns.    This visit is provided free of charge (no copay) for all Medicare recipients. The clinical pharmacists at [facility] have begun to conduct these Wellness Visits which will also include a thorough review of all your medications.    As you primary medical provider recommend that you make an appointment for your Annual Wellness Visit if you have not done so already this year.  You may set up this appointment before you leave today or you may call back ([phone]) and schedule an appointment.  Please make sure when you call that you mention that you are scheduling your Annual Wellness Visit with the clinical pharmacist so that the appointment may be made for the proper length of time.     Continue current medications. Continue good therapeutic lifestyle changes which include good diet and exercise. Fall precautions discussed with patient. If an FOBT was given today- please return it to our front desk. If you are over 71 years old - you may need Prevnar 91 or the adult Pneumonia vaccine.  **Flu shots are available--- please call and schedule a FLU-CLINIC appointment**  After your visit with [place] today you will receive a survey in the mail or online from [employer] regarding your care with [place]. Please take a moment to fill this out. Your feedback is very  important to [place] as you can help [place] better understand your patient needs as well as improve your experience and satisfaction. WE CARE ABOUT YOU!!!   Follow-up with neurologist and rheumatologist as planned We will need to make sure that you are up-to-date on following the pulmonary nodules. We will call with your lab work results as soon as those results become available Please check with your insurance regarding the new shingles shot

## 2017-05-08 LAB — CBC WITH DIFFERENTIAL/PLATELET
Basophils Absolute: 0 10*3/uL (ref 0.0–0.2)
Basos: 0 %
EOS (ABSOLUTE): 0.1 10*3/uL (ref 0.0–0.4)
EOS: 1 %
HEMATOCRIT: 37.3 % — AB (ref 37.5–51.0)
Hemoglobin: 12.3 g/dL — ABNORMAL LOW (ref 13.0–17.7)
Immature Grans (Abs): 0 10*3/uL (ref 0.0–0.1)
Immature Granulocytes: 0 %
LYMPHS ABS: 1.8 10*3/uL (ref 0.7–3.1)
Lymphs: 30 %
MCH: 30.8 pg (ref 26.6–33.0)
MCHC: 33 g/dL (ref 31.5–35.7)
MCV: 94 fL (ref 79–97)
MONOS ABS: 0.7 10*3/uL (ref 0.1–0.9)
Monocytes: 11 %
NEUTROS ABS: 3.4 10*3/uL (ref 1.4–7.0)
Neutrophils: 58 %
Platelets: 343 10*3/uL (ref 150–379)
RBC: 3.99 x10E6/uL — AB (ref 4.14–5.80)
RDW: 16.7 % — AB (ref 12.3–15.4)
WBC: 6 10*3/uL (ref 3.4–10.8)

## 2017-05-08 LAB — PSA, TOTAL AND FREE
PROSTATE SPECIFIC AG, SERUM: 0.3 ng/mL (ref 0.0–4.0)
PSA FREE PCT: 66.7 %
PSA FREE: 0.2 ng/mL

## 2017-05-08 LAB — LIPID PANEL
CHOL/HDL RATIO: 2.4 ratio (ref 0.0–5.0)
Cholesterol, Total: 156 mg/dL (ref 100–199)
HDL: 64 mg/dL (ref >39)
LDL Calculated: 73 mg/dL (ref 0–99)
TRIGLYCERIDES: 93 mg/dL (ref 0–149)
VLDL Cholesterol Cal: 19 mg/dL (ref 5–40)

## 2017-05-08 LAB — HEPATIC FUNCTION PANEL
ALK PHOS: 86 IU/L (ref 39–117)
ALT: 30 IU/L (ref 0–44)
AST: 43 IU/L — AB (ref 0–40)
Albumin: 4.7 g/dL (ref 3.5–4.8)
Bilirubin Total: 0.3 mg/dL (ref 0.0–1.2)
Bilirubin, Direct: 0.09 mg/dL (ref 0.00–0.40)
Total Protein: 7.4 g/dL (ref 6.0–8.5)

## 2017-05-08 LAB — THYROID PANEL WITH TSH
Free Thyroxine Index: 1.9 (ref 1.2–4.9)
T3 UPTAKE RATIO: 26 % (ref 24–39)
T4 TOTAL: 7.4 ug/dL (ref 4.5–12.0)
TSH: 2.61 u[IU]/mL (ref 0.450–4.500)

## 2017-05-08 LAB — BMP8+EGFR
BUN/Creatinine Ratio: 15 (ref 10–24)
BUN: 15 mg/dL (ref 8–27)
CALCIUM: 9.6 mg/dL (ref 8.6–10.2)
CO2: 29 mmol/L (ref 20–29)
CREATININE: 1 mg/dL (ref 0.76–1.27)
Chloride: 99 mmol/L (ref 96–106)
GFR calc Af Amer: 87 mL/min/{1.73_m2} (ref >59)
GFR, EST NON AFRICAN AMERICAN: 75 mL/min/{1.73_m2} (ref >59)
Glucose: 90 mg/dL (ref 65–99)
POTASSIUM: 5.2 mmol/L (ref 3.5–5.2)
Sodium: 141 mmol/L (ref 134–144)

## 2017-05-08 LAB — VITAMIN D 25 HYDROXY (VIT D DEFICIENCY, FRACTURES): VIT D 25 HYDROXY: 47.9 ng/mL (ref 30.0–100.0)

## 2017-05-08 NOTE — Addendum Note (Signed)
Addended by: Thana Ates on: 05/08/2017 11:47 AM   Modules accepted: Orders

## 2017-05-22 ENCOUNTER — Other Ambulatory Visit: Payer: Self-pay | Admitting: Family Medicine

## 2017-08-16 ENCOUNTER — Other Ambulatory Visit: Payer: Self-pay | Admitting: Family Medicine

## 2017-08-19 ENCOUNTER — Other Ambulatory Visit: Payer: Self-pay | Admitting: Family Medicine

## 2017-08-25 ENCOUNTER — Other Ambulatory Visit: Payer: Self-pay | Admitting: Family Medicine

## 2017-08-26 ENCOUNTER — Other Ambulatory Visit: Payer: Self-pay | Admitting: *Deleted

## 2017-08-26 MED ORDER — ATORVASTATIN CALCIUM 40 MG PO TABS: ORAL_TABLET | ORAL | 0 refills | Status: DC

## 2017-09-14 ENCOUNTER — Other Ambulatory Visit: Payer: Self-pay | Admitting: Family Medicine

## 2017-09-22 ENCOUNTER — Ambulatory Visit (INDEPENDENT_AMBULATORY_CARE_PROVIDER_SITE_OTHER): Payer: Medicare Other

## 2017-09-22 ENCOUNTER — Encounter: Payer: Self-pay | Admitting: Family Medicine

## 2017-09-22 ENCOUNTER — Ambulatory Visit: Payer: Medicare Other | Admitting: Family Medicine

## 2017-09-22 VITALS — BP 118/65 | HR 70 | Temp 97.0°F | Ht 64.56 in | Wt 156.0 lb

## 2017-09-22 DIAGNOSIS — R911 Solitary pulmonary nodule: Secondary | ICD-10-CM | POA: Diagnosis not present

## 2017-09-22 DIAGNOSIS — E7800 Pure hypercholesterolemia, unspecified: Secondary | ICD-10-CM | POA: Diagnosis not present

## 2017-09-22 DIAGNOSIS — E039 Hypothyroidism, unspecified: Secondary | ICD-10-CM

## 2017-09-22 DIAGNOSIS — N400 Enlarged prostate without lower urinary tract symptoms: Secondary | ICD-10-CM

## 2017-09-22 DIAGNOSIS — R9389 Abnormal findings on diagnostic imaging of other specified body structures: Secondary | ICD-10-CM | POA: Diagnosis not present

## 2017-09-22 DIAGNOSIS — M069 Rheumatoid arthritis, unspecified: Secondary | ICD-10-CM

## 2017-09-22 DIAGNOSIS — G40309 Generalized idiopathic epilepsy and epileptic syndromes, not intractable, without status epilepticus: Secondary | ICD-10-CM | POA: Diagnosis not present

## 2017-09-22 DIAGNOSIS — G40909 Epilepsy, unspecified, not intractable, without status epilepticus: Secondary | ICD-10-CM

## 2017-09-22 DIAGNOSIS — E559 Vitamin D deficiency, unspecified: Secondary | ICD-10-CM | POA: Diagnosis not present

## 2017-09-22 NOTE — Patient Instructions (Addendum)
Medicare Annual Wellness Visit  [facility] and the medical providers at [facility] strive to bring you the best medical care.  In doing so we not only want to address your current medical conditions and concerns but also to detect new conditions early and prevent illness, disease and health-related problems.    Medicare offers a yearly Wellness Visit which allows our clinical staff to assess your need for preventative services including immunizations, lifestyle education, counseling to decrease risk of preventable diseases and screening for fall risk and other medical concerns.    This visit is provided free of charge (no copay) for all Medicare recipients. The clinical pharmacists at [facility] have begun to conduct these Wellness Visits which will also include a thorough review of all your medications.    As you primary medical provider recommend that you make an appointment for your Annual Wellness Visit if you have not done so already this year.  You may set up this appointment before you leave today or you may call back ([phone]) and schedule an appointment.  Please make sure when you call that you mention that you are scheduling your Annual Wellness Visit with the clinical pharmacist so that the appointment may be made for the proper length of time.     Continue current medications. Continue good therapeutic lifestyle changes which include good diet and exercise. Fall precautions discussed with patient. If an FOBT was given today- please return it to our front desk. If you are over 26 years old - you may need Prevnar 6 or the adult Pneumonia vaccine.  **Flu shots are available--- please call and schedule a FLU-CLINIC appointment**  After your visit with [place] today you will receive a survey in the mail or online from [employer] regarding your care with [place]. Please take a moment to fill this out. Your feedback is very  important to [place] as you can help [place] better understand your patient needs as well as improve your experience and satisfaction. WE CARE ABOUT YOU!!!   Continue to follow-up with rheumatology and neurology Stay active and walk daily Drink plenty of water and fluids We will call with lab work results and chest x-ray results as soon as those results become available

## 2017-09-22 NOTE — Progress Notes (Signed)
Subjective:    Patient ID: Troy Rodgers., male    DOB: [birth year], 72 y.o.   MRN: [id number]  HPI Pt here for follow up and management of chronic medical problems which includes hyperlipidemia and hypothyroid. He is taking medication regularly.  Troy Rodgers is doing well overall.  He has epilepsy.  He sees the neurologist regularly.  He also has rheumatoid arthritis.  The patient sees the neurologist about every 5-6 months.  He has an appointment coming up with the rheumatologist in the next month.  He has some lab work done or that was requested by the rheumatologist so that we can forward that to him.  The patient denies any chest pain pressure tightness or shortness of breath.  He denies any trouble with swallowing heartburn indigestion nausea vomiting diarrhea blood in the stool or black tarry bowel movements.  There has been no change in bowel habits.  The patient is passing his water without problems.  He denies any episodes of seizure issues.    Patient Active Problem List   Diagnosis Date Noted  . Epilepsy ([place]) 11/06/2015  . Hypothyroidism 05/04/2014  . Solitary pulmonary nodule RLL lateral basal segment  10/12/2013  . Rheumatoid arthritis ([place]) 08/19/2013  . High risk medication use 08/19/2013  . BPH (benign prostatic hyperplasia) 08/19/2013  . Seizure disorder ([place]) 05/06/2013  . Acute gangrenous cholecystitis 04/28/2013  . Abnormal EKG 04/22/2012  . Hyperlipidemia   . Depressive disorder, not elsewhere classified   . Localization-related (focal) (partial) epilepsy and epileptic syndromes with complex partial seizures, without mention of intractable epilepsy   . Kidney stone   . Impotence of organic origin   . External hemorrhoid   . Nasal septal perforation   . Cataract   . Hyperplasia of prostate    Outpatient Encounter Medications as of 09/22/2017  Medication Sig  . aspirin 81 MG EC tablet Take 81 mg by mouth daily.    Marland Kitchen atorvastatin (LIPITOR) 40 MG tablet TAKE 1 TABLET  EVERY EVENING AS DIRECTED  . Cholecalciferol (VITAMIN D3) 2000 UNITS TABS Take 1 tablet twice Mon-Fri and 2 twice a day on Sat and Sun  . escitalopram (LEXAPRO) 20 MG tablet TAKE 1 TABLET BY MOUTH EVERY DAY  . ezetimibe (ZETIA) 10 MG tablet TAKE ONE TABLET BY MOUTH DAILY FOR HYPERLIPIDERMEA  . folic acid (FOLVITE) 1 MG tablet Take 1 mg by mouth 2 (two) times daily.   Marland Kitchen guaiFENesin (MUCINEX) 600 MG 12 hr tablet Take 600 mg by mouth as needed.   . lacosamide (VIMPAT) 200 MG TABS tablet Take 200 mg by mouth 2 (two) times daily.  Marland Kitchen levothyroxine (SYNTHROID, LEVOTHROID) 50 MCG tablet TAKE 1 TABLET (50 MCG TOTAL) BY MOUTH DAILY.  . methotrexate (RHEUMATREX) 2.5 MG tablet Take 10 mg by mouth once a week.   . omega-3 fish oil (MAXEPA) 1000 MG CAPS capsule Take 2 capsules by mouth daily.  Marland Kitchen omeprazole (PRILOSEC) 20 MG capsule TAKE 1 CAPSULE BY MOUTH EVERY DAY  . primidone (MYSOLINE) 250 MG tablet 1 in the am and 1 1/2 at bedtime   No facility-administered encounter medications on file as of 09/22/2017.      Review of Systems  Constitutional: Negative.   HENT: Negative.   Eyes: Negative.   Respiratory: Negative.   Cardiovascular: Negative.   Gastrointestinal: Negative.   Endocrine: Negative.   Genitourinary: Negative.   Musculoskeletal: Negative.   Skin: Negative.   Allergic/Immunologic: Negative.   Neurological: Negative.   Hematological: Negative.  Psychiatric/Behavioral: Negative.        Objective:   Physical Exam  Constitutional: He is oriented to person, place, and time. He appears well-developed and well-nourished. No distress.  The patient is pleasant and alert with no specific complaints.  HENT:  Head: Normocephalic and atraumatic.  Right Ear: External ear normal.  Left Ear: External ear normal.  Nose: Nose normal.  Mouth/Throat: Oropharynx is clear and moist. No oropharyngeal exudate.  Eyes: Conjunctivae and EOM are normal. Pupils are equal, round, and reactive to light.  Right eye exhibits no discharge. Left eye exhibits no discharge. No scleral icterus.  Neck: Normal range of motion. Neck supple. No thyromegaly present.  No bruits or thyromegaly or anterior cervical adenopathy  Cardiovascular: Normal rate, regular rhythm, normal heart sounds and intact distal pulses.  No murmur heard. Heart is regular at 72/min  Pulmonary/Chest: Effort normal and breath sounds normal. No respiratory distress. He has no wheezes. He has no rales. He exhibits no tenderness.  Clear anteriorly and posteriorly with no chest wall masses or axillary adenopathy  Abdominal: Soft. Bowel sounds are normal. He exhibits no mass. There is no tenderness. There is no rebound and no guarding.  No abdominal tenderness masses organ enlargement or bruits.  No inguinal adenopathy.  Musculoskeletal: Normal range of motion. He exhibits deformity. He exhibits no edema.  Mild kyphosis  Lymphadenopathy:    He has no cervical adenopathy.  Neurological: He is alert and oriented to person, place, and time. He has normal reflexes. No cranial nerve deficit.  Skin: Skin is warm and dry. No rash noted.  Psychiatric: He has a normal mood and affect. His behavior is normal. Judgment and thought content normal.  Nursing note and vitals reviewed.   BP 118/65 (BP Location: Left Arm)   Pulse 70   Temp (!) 97 F (36.1 C) (Oral)   Ht 5' 4.56" (1.64 m)   Wt 156 lb (70.8 kg)   BMI 26.31 kg/m         Assessment & Plan:  1. Vitamin D deficiency -Continue with vitamin D replacement pending results of lab work - CBC with Differential/Platelet - VITAMIN D 25 Hydroxy (Vit-D Deficiency, Fractures)  2. Pure hypercholesterolemia -Continue with statin therapy and as aggressive therapeutic lifestyle changes as possible - BMP8+EGFR - CBC with Differential/Platelet - Lipid panel - Hepatic function panel - DG Chest 2 View; Future  3. Benign prostatic hyperplasia, unspecified whether lower urinary tract  symptoms present -No complaints today with voiding - CBC with Differential/Platelet  4. Seizure disorder [facility] -Patient continues to see Dr. Merlene Laughter every 5-6 months. - BMP8+EGFR - CBC with Differential/Platelet  5. Hypothyroidism, unspecified type -Continue with current treatment pending results of lab work - CBC with Differential/Platelet - Thyroid Panel With TSH  6. Lung nodule -Chest x-ray today no more CT scans were deemed necessary. - DG Chest 2 View; Future  7. Rheumatoid arthritis involving multiple sites, unspecified rheumatoid factor presence ([place]) -Follow-up with rheumatologist as planned and patient will get additional lab work along with her lab work today for the rheumatologist.  8. Abnormal chest CT -No more CT scans were deemed necessary  9. Nonintractable generalized idiopathic epilepsy without status epilepticus [facility]) -Follow-up with neurologist as planned  Patient Instructions                       Medicare Annual Wellness Visit  [facility] and the medical providers at [place] strive to bring you  the best medical care.  In doing so we not only want to address your current medical conditions and concerns but also to detect new conditions early and prevent illness, disease and health-related problems.    Medicare offers a yearly Wellness Visit which allows our clinical staff to assess your need for preventative services including immunizations, lifestyle education, counseling to decrease risk of preventable diseases and screening for fall risk and other medical concerns.    This visit is provided free of charge (no copay) for all Medicare recipients. The clinical pharmacists at [facility] have begun to conduct these Wellness Visits which will also include a thorough review of all your medications.    As you primary medical provider recommend that you make an appointment for your Annual Wellness Visit if you have  not done so already this year.  You may set up this appointment before you leave today or you may call back ([phone]) and schedule an appointment.  Please make sure when you call that you mention that you are scheduling your Annual Wellness Visit with the clinical pharmacist so that the appointment may be made for the proper length of time.     Continue current medications. Continue good therapeutic lifestyle changes which include good diet and exercise. Fall precautions discussed with patient. If an FOBT was given today- please return it to our front desk. If you are over 63 years old - you may need Prevnar 9 or the adult Pneumonia vaccine.  **Flu shots are available--- please call and schedule a FLU-CLINIC appointment**  After your visit with [place] today you will receive a survey in the mail or online from [employer] regarding your care with [place]. Please take a moment to fill this out. Your feedback is very important to [place] as you can help [place] better understand your patient needs as well as improve your experience and satisfaction. WE CARE ABOUT YOU!!!   Continue to follow-up with rheumatology and neurology Stay active and walk daily Drink plenty of water and fluids We will call with lab work results and chest x-ray results as soon as those results become available  Arrie Senate MD

## 2017-09-23 ENCOUNTER — Other Ambulatory Visit: Payer: Self-pay

## 2017-09-23 DIAGNOSIS — R710 Precipitous drop in hematocrit: Secondary | ICD-10-CM

## 2017-09-23 LAB — CBC WITH DIFFERENTIAL/PLATELET
Basophils Absolute: 0 10*3/uL (ref 0.0–0.2)
Basos: 0 %
EOS (ABSOLUTE): 0.1 10*3/uL (ref 0.0–0.4)
EOS: 2 %
Hematocrit: 36.8 % — ABNORMAL LOW (ref 37.5–51.0)
Hemoglobin: 11.9 g/dL — ABNORMAL LOW (ref 13.0–17.7)
IMMATURE GRANULOCYTES: 0 %
Immature Grans (Abs): 0 10*3/uL (ref 0.0–0.1)
Lymphocytes Absolute: 1.5 10*3/uL (ref 0.7–3.1)
Lymphs: 17 %
MCH: 31.7 pg (ref 26.6–33.0)
MCHC: 32.3 g/dL (ref 31.5–35.7)
MCV: 98 fL — ABNORMAL HIGH (ref 79–97)
Monocytes Absolute: 1.5 10*3/uL — ABNORMAL HIGH (ref 0.1–0.9)
Monocytes: 17 %
NEUTROS PCT: 64 %
Neutrophils Absolute: 5.6 10*3/uL (ref 1.4–7.0)
PLATELETS: 329 10*3/uL (ref 150–379)
RBC: 3.75 x10E6/uL — AB (ref 4.14–5.80)
RDW: 15.9 % — AB (ref 12.3–15.4)
WBC: 8.7 10*3/uL (ref 3.4–10.8)

## 2017-09-23 LAB — LIPID PANEL
Chol/HDL Ratio: 2.4 ratio (ref 0.0–5.0)
Cholesterol, Total: 144 mg/dL (ref 100–199)
HDL: 59 mg/dL (ref >39)
LDL Calculated: 59 mg/dL (ref 0–99)
TRIGLYCERIDES: 129 mg/dL (ref 0–149)
VLDL Cholesterol Cal: 26 mg/dL (ref 5–40)

## 2017-09-23 LAB — BMP8+EGFR
BUN/Creatinine Ratio: 10 (ref 10–24)
BUN: 12 mg/dL (ref 8–27)
CALCIUM: 9.2 mg/dL (ref 8.6–10.2)
CO2: 27 mmol/L (ref 20–29)
CREATININE: 1.18 mg/dL (ref 0.76–1.27)
Chloride: 96 mmol/L (ref 96–106)
GFR calc Af Amer: 71 mL/min/{1.73_m2} (ref >59)
GFR calc non Af Amer: 61 mL/min/{1.73_m2} (ref >59)
GLUCOSE: 88 mg/dL (ref 65–99)
POTASSIUM: 4.3 mmol/L (ref 3.5–5.2)
SODIUM: 139 mmol/L (ref 134–144)

## 2017-09-23 LAB — THYROID PANEL WITH TSH
FREE THYROXINE INDEX: 1.8 (ref 1.2–4.9)
T3 Uptake Ratio: 25 % (ref 24–39)
T4, Total: 7.3 ug/dL (ref 4.5–12.0)
TSH: 2.91 u[IU]/mL (ref 0.450–4.500)

## 2017-09-23 LAB — HEPATIC FUNCTION PANEL
ALT: 29 IU/L (ref 0–44)
AST: 37 IU/L (ref 0–40)
Albumin: 4.3 g/dL (ref 3.5–4.8)
Alkaline Phosphatase: 96 IU/L (ref 39–117)
BILIRUBIN TOTAL: 0.2 mg/dL (ref 0.0–1.2)
BILIRUBIN, DIRECT: 0.08 mg/dL (ref 0.00–0.40)
Total Protein: 7.1 g/dL (ref 6.0–8.5)

## 2017-09-23 LAB — VITAMIN D 25 HYDROXY (VIT D DEFICIENCY, FRACTURES): Vit D, 25-Hydroxy: 50.3 ng/mL (ref 30.0–100.0)

## 2017-09-25 ENCOUNTER — Telehealth: Payer: Self-pay | Admitting: *Deleted

## 2017-09-25 LAB — SPECIMEN STATUS REPORT

## 2017-09-25 LAB — IRON AND TIBC
IRON: 65 ug/dL (ref 38–169)
Iron Saturation: 23 % (ref 15–55)
Total Iron Binding Capacity: 278 ug/dL (ref 250–450)
UIBC: 213 ug/dL (ref 111–343)

## 2017-09-25 MED ORDER — INTEGRA PLUS PO CAPS: 1.0000 | ORAL_CAPSULE | Freq: Every day | ORAL | 2 refills | Status: DC

## 2017-09-25 NOTE — Telephone Encounter (Signed)
-----   Message from Chipper Herb, MD sent at 09/25/2017  9:18 AM EDT ----- All iron studies are within normal limits with a tendency to be toward the lower end of the normal range. These call a prescription in for Integra and have him take 1 daily.  Put a couple of refills on this prescription.  He should have the CBC repeated in 4 weeks and he does not have to be fasting for that. Please talk to his wife about this before calling it in The had a colonoscopy in 2018 that was normal according to a report from [facility]

## 2017-09-25 NOTE — Telephone Encounter (Signed)
Pt notified of recommendation RX sent to [facility] per Dr [name]

## 2017-10-29 ENCOUNTER — Ambulatory Visit (INDEPENDENT_AMBULATORY_CARE_PROVIDER_SITE_OTHER): Payer: Medicare Other | Admitting: *Deleted

## 2017-10-29 VITALS — BP 95/50 | HR 62 | Temp 97.2°F | Ht 64.45 in | Wt 155.0 lb

## 2017-10-29 DIAGNOSIS — Z0000 Encounter for general adult medical examination without abnormal findings: Secondary | ICD-10-CM | POA: Diagnosis not present

## 2017-10-29 DIAGNOSIS — R710 Precipitous drop in hematocrit: Secondary | ICD-10-CM

## 2017-10-29 NOTE — Patient Instructions (Signed)
  Mr. Eddinger , Thank you for taking time to come for your Medicare Wellness Visit. I appreciate your ongoing commitment to your health goals. Please review the following plan we discussed and let me know if I can assist you in the future.   These are the goals we discussed: Goals    . Increase physical activity     Stay active     . Prevent falls       This is a list of the screening recommended for you and due dates:  Health Maintenance  Topic Date Due  . Pneumonia vaccines (2 of 2 - PPSV23) 09/23/2018*  . Flu Shot  02/12/2018  . Colon Cancer Screening  05/10/2018  . Tetanus Vaccine  05/15/2018  .  Hepatitis C: One time screening is recommended by Center for Disease Control  (CDC) for  adults born from [birth year] through 1965.   Completed  *Topic was postponed. The date shown is not the original due date.    Keep follow up with DR [name] and other specialist  Be careful - do not put yourself at risk for falls Stay active We will call you with labs from today (CBC)

## 2017-10-29 NOTE — Progress Notes (Signed)
Subjective:   Roswell Ndiaye. is a 72 y.o. male who presents for Medicare Annual/Subsequent preventive examination. He is accompanied today by his wife. He has been retired from [employer] since he was 72 years old. He enjoys watching TV and wood-working in his spare time. For exercise he walks or does a exercise DVD at home. He states that he exercise for about 30 mins, 7 days a week. He states that he eats a pretty healthy diet, but enjoys going out to eat. He attends [employer] regularly. He lives at home with his wife, Bethena Roys. They have 2 sons, one lives in [place], [place] and the other in [place]. They do not have any pets and fall hazards were discussed today. He states that his health is about the same as it was a year ago.   Cardiac Risk Factors include: advanced age (>22men, >57 women);male gender;dyslipidemia     Objective:    Vitals: BP (!) 95/50 (BP Location: Left Arm)   Pulse 62   Temp (!) 97.2 F (36.2 C) (Oral)   Ht 5' 4.45" (1.637 m)   Wt 155 lb (70.3 kg)   BMI 26.24 kg/m   Body mass index is 26.24 kg/m.  Advanced Directives 10/29/2017 10/28/2016 04/28/2013 04/28/2013 04/27/2013  Does Patient Have a Medical Advance Directive? No No - Patient does not have advance directive;Patient would not like information Patient does not have advance directive;Patient would not like information  Would patient like information on creating a medical advance directive? Yes (MAU/Ambulatory/Procedural Areas - Information given) - - - -  Pre-existing out of facility DNR order (yellow form or pink MOST form) - - No No No    Tobacco Social History   Tobacco Use  Smoking Status Never Smoker  Smokeless Tobacco Never Used     Counseling given: Not Answered   Clinical Intake:                       Past Medical History:  Diagnosis Date  . Abdominal pain   . Arthritis    rheumatoid   . Asthma   . Bradycardia   . Cataract   . Chest pain   . Cholelithiases   .  Depressive disorder, not elsewhere classified   . GERD (gastroesophageal reflux disease)   . Hearing deficit, bilateral   . HLD (hyperlipidemia)   . Hyperplasia of prostate   . Impotence of organic origin   . Kidney stone   . Localization-related (focal) (partial) epilepsy and epileptic syndromes with complex partial seizures, without mention of intractable epilepsy   . Nasal septal perforation   . Other and unspecified hyperlipidemia   . Seizures ([place])   . Thyroid disease    Past Surgical History:  Procedure Laterality Date  . CHOLECYSTECTOMY  04/28/2013   Procedure: LAPAROSCOPIC CHOLECYSTECTOMY;  Surgeon: Ralene Ok, MD;  Location: [facility];  Service: General;;  . LEFT SHOULDER SURGERY  1996   Family History  Problem Relation Age of Onset  . Kidney failure Father        died age 55  . Kidney disease Father   . Heart disease Father        bypass surgery  . Colon cancer Mother   . Alzheimer's disease Mother   . Hyperlipidemia Mother   . Heart failure Sister        CHF  . Heart disease Sister   . Peripheral vascular disease Sister   . Rheum arthritis  Sister   . Pancreatitis Sister   . Pneumonia Sister   . Hyperlipidemia Son   . Alzheimer's disease Maternal Grandmother   . Peripheral vascular disease Sister   . Anxiety disorder Son    Social History   Socioeconomic History  . Marital status: Married    Spouse name: Bethena Roys   . Number of children: 2  . Years of education: Not on file  . Highest education level: Not on file  Occupational History  . 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 Financial resource strain: Not on file  . Food insecurity:    Worry: Not on file    Inability: Not on file  . Transportation needs:    Medical: Not on file    Non-medical: Not on file  Tobacco Use  . Smoking status: Never Smoker  . Smokeless tobacco: Never Used  Substance and Sexual Activity  . Alcohol use: No  . Drug  use: No  . Sexual activity: Not on file  Lifestyle  . Physical activity:    Days per week: Not on file    Minutes per session: Not on file  . Stress: Not on file  Relationships  . Social connections:    Talks on phone: Not on file    Gets together: Not on file    Attends religious service: Not on file    Active member of club or organization: Not on file    Attends meetings of clubs or organizations: Not on file    Relationship status: Not on file  Other Topics Concern  . Not on file  Social History Narrative   Lives with wife.      Outpatient Encounter Medications as of 10/29/2017  Medication Sig  . aspirin 81 MG EC tablet Take 81 mg by mouth daily.    Marland Kitchen atorvastatin (LIPITOR) 40 MG tablet TAKE 1 TABLET EVERY EVENING AS DIRECTED  . Cholecalciferol (VITAMIN D3) 2000 UNITS TABS Take 1 tablet twice Mon-Fri and 2 twice a day on Sat and Sun  . escitalopram (LEXAPRO) 20 MG tablet TAKE 1 TABLET BY MOUTH EVERY DAY  . ezetimibe (ZETIA) 10 MG tablet TAKE ONE TABLET BY MOUTH DAILY FOR HYPERLIPIDERMEA  . folic acid (FOLVITE) 1 MG tablet Take 1 mg by mouth 2 (two) times daily.   Marland Kitchen guaiFENesin (MUCINEX) 600 MG 12 hr tablet Take 600 mg by mouth as needed.   . lacosamide (VIMPAT) 200 MG TABS tablet Take 200 mg by mouth 2 (two) times daily.  Marland Kitchen levothyroxine (SYNTHROID, LEVOTHROID) 50 MCG tablet TAKE 1 TABLET (50 MCG TOTAL) BY MOUTH DAILY.  . methotrexate (RHEUMATREX) 2.5 MG tablet Take 10 mg by mouth once a week.   . omega-3 fish oil (MAXEPA) 1000 MG CAPS capsule Take 2 capsules by mouth daily.  Marland Kitchen omeprazole (PRILOSEC) 20 MG capsule TAKE 1 CAPSULE BY MOUTH EVERY DAY  . primidone (MYSOLINE) 250 MG tablet 250 mg. 1 in the am and 1 at bedtime  . [DISCONTINUED] FeFum-FePoly-FA-B Cmp-C-Biot (INTEGRA PLUS) CAPS Take 1 capsule by mouth daily.   No facility-administered encounter medications on file as of 10/29/2017.     Activities of Daily Living In your present state of health, do you have any  difficulty performing the following activities: 10/29/2017  Hearing? Y  Comment bilateral hearing aids   Vision? Y  Comment wears RX glasses   Difficulty concentrating or making decisions? Y  Comment forgetfull  Walking or climbing stairs? N  Dressing or bathing? N  Doing errands, shopping? Y  Comment does not Medical illustrator and eating ? N  Using the Toilet? N  In the past six months, have you accidently leaked urine? N  Do you have problems with loss of bowel control? N  Managing your Medications? N  Managing your Finances? Y  Comment wife does Biomedical engineer or managing your Housekeeping? N  Some recent data might be hidden    Patient Care Team: Chipper Herb, MD as PCP - General (Family Medicine) Katy Apo, MD as Consulting Physician (Ophthalmology) Phillips Odor, MD as Consulting Physician (Neurology) Minus Breeding, MD as Consulting Physician (Cardiology) Rogene Houston, MD as Consulting Physician (Gastroenterology) Hennie Duos, MD as Consulting Physician (Rheumatology) Tanda Rockers, MD as Consulting Physician (Pulmonary Disease)   Assessment:   This is a routine wellness examination for Tavari.  Exercise Activities and Dietary recommendations Current Exercise Habits: Home exercise routine, Time (Minutes): 30, Frequency (Times/Week): 7, Weekly Exercise (Minutes/Week): 210, Intensity: Mild, Exercise limited by: None identified  Goals    . Increase physical activity     Stay active     . Prevent falls       Fall Risk Fall Risk  10/29/2017 09/22/2017 05/07/2017 09/26/2016 07/29/2016  Falls in the past year? Yes No Yes No No  Number falls in past yr: 1 - 1 - -  Injury with Fall? No - No - -   Is the patient's home free of loose throw rugs in walkways, pet beds, electrical cords, etc? Fall hazards and risks were discussed today.  Depression Screen PHQ 2/9 Scores 10/29/2017 09/22/2017 05/07/2017 09/26/2016  PHQ - 2 Score 0 1 0 0     Cognitive Function MMSE - Mini Mental State Exam 10/29/2017  Orientation to time 5  Orientation to Place 5  Registration 3  Attention/ Calculation 5  Recall 1  Language- name 2 objects 2  Language- repeat 1  Language- follow 3 step command 3  Language- read & follow direction 1  Write a sentence 1  Copy design 1  Total score 28        Immunization History  Administered Date(s) Administered  . Influenza Split 04/14/2013  . Influenza Whole 04/14/2010  . Influenza, High Dose Seasonal PF 05/07/2016, 05/07/2017  . Influenza,inj,Quad PF,6+ Mos 05/04/2014, 04/19/2015  . Pneumococcal Conjugate-13 08/23/2013  . Td 05/15/2008  . Zoster 05/15/2008    Qualifies for Shingles Vaccine? Discussed - declined today   Screening Tests Health Maintenance  Topic Date Due  . PNA vac Low Risk Adult (2 of 2 - PPSV23) 09/23/2018 (Originally 08/23/2014)  . INFLUENZA VACCINE  02/12/2018  . COLONOSCOPY  05/10/2018  . TETANUS/TDAP  05/15/2018  . Hepatitis C Screening  Completed   Cancer Screenings: Lung: Low Dose CT Chest recommended if Age 52-80 years, 30 pack-year currently smoking OR have quit w/in 15years. Patient does qualify. Colorectal: due after 12/30/17.  Additional Screenings: declined  Hepatitis C Screening:      Plan:   pt is to keep follow up with Dr [name] and other specialist  He had a CBC today for decreased HGB (can not take Integra) Immunizations are up to date as well as health maintenance.   I have personally reviewed and noted the following in the patient's chart:   . Medical and social history . Use of alcohol, tobacco or illicit drugs  . Current medications and supplements .  Functional ability and status . Nutritional status . Physical activity . Advanced directives . List of other physicians . Hospitalizations, surgeries, and ER visits in previous 12 months . Vitals . Screenings to include cognitive, depression, and falls . Referrals and appointments  In  addition, I have reviewed and discussed with patient certain preventive protocols, quality metrics, and best practice recommendations. A written personalized care plan for preventive services as well as general preventive health recommendations were provided to patient.     Huntley Dec, [place]  [id number]

## 2017-10-30 LAB — CBC WITH DIFFERENTIAL/PLATELET
BASOS ABS: 0 10*3/uL (ref 0.0–0.2)
Basos: 1 %
EOS (ABSOLUTE): 0.1 10*3/uL (ref 0.0–0.4)
EOS: 1 %
HEMOGLOBIN: 12.6 g/dL — AB (ref 13.0–17.7)
Hematocrit: 37.3 % — ABNORMAL LOW (ref 37.5–51.0)
IMMATURE GRANS (ABS): 0 10*3/uL (ref 0.0–0.1)
Immature Granulocytes: 0 %
LYMPHS: 28 %
Lymphocytes Absolute: 1.8 10*3/uL (ref 0.7–3.1)
MCH: 33 pg (ref 26.6–33.0)
MCHC: 33.8 g/dL (ref 31.5–35.7)
MCV: 98 fL — ABNORMAL HIGH (ref 79–97)
MONOCYTES: 14 %
Monocytes Absolute: 0.9 10*3/uL (ref 0.1–0.9)
Neutrophils Absolute: 3.6 10*3/uL (ref 1.4–7.0)
Neutrophils: 56 %
Platelets: 319 10*3/uL (ref 150–379)
RBC: 3.82 x10E6/uL — AB (ref 4.14–5.80)
RDW: 17.1 % — ABNORMAL HIGH (ref 12.3–15.4)
WBC: 6.5 10*3/uL (ref 3.4–10.8)

## 2017-11-09 ENCOUNTER — Other Ambulatory Visit: Payer: Self-pay | Admitting: Family Medicine

## 2017-11-10 NOTE — Telephone Encounter (Signed)
OV 01/14/18

## 2017-11-11 ENCOUNTER — Other Ambulatory Visit: Payer: Self-pay | Admitting: Family Medicine

## 2017-12-09 ENCOUNTER — Other Ambulatory Visit: Payer: Self-pay | Admitting: Family Medicine

## 2018-01-16 ENCOUNTER — Other Ambulatory Visit: Payer: Self-pay | Admitting: Family Medicine

## 2018-02-05 ENCOUNTER — Other Ambulatory Visit: Payer: Self-pay | Admitting: Family Medicine

## 2018-02-05 NOTE — Telephone Encounter (Signed)
last seen 09/22/17   DWM

## 2018-02-10 ENCOUNTER — Encounter: Payer: Self-pay | Admitting: Family Medicine

## 2018-02-10 ENCOUNTER — Ambulatory Visit: Payer: Medicare Other | Admitting: Family Medicine

## 2018-02-10 VITALS — BP 106/53 | HR 57 | Temp 97.5°F | Ht 64.45 in | Wt 150.0 lb

## 2018-02-10 DIAGNOSIS — E039 Hypothyroidism, unspecified: Secondary | ICD-10-CM

## 2018-02-10 DIAGNOSIS — N400 Enlarged prostate without lower urinary tract symptoms: Secondary | ICD-10-CM

## 2018-02-10 DIAGNOSIS — E7800 Pure hypercholesterolemia, unspecified: Secondary | ICD-10-CM | POA: Diagnosis not present

## 2018-02-10 DIAGNOSIS — M069 Rheumatoid arthritis, unspecified: Secondary | ICD-10-CM

## 2018-02-10 DIAGNOSIS — E559 Vitamin D deficiency, unspecified: Secondary | ICD-10-CM

## 2018-02-10 DIAGNOSIS — G40909 Epilepsy, unspecified, not intractable, without status epilepticus: Secondary | ICD-10-CM | POA: Diagnosis not present

## 2018-02-10 NOTE — Patient Instructions (Addendum)
Medicare Annual Wellness Visit  [facility] and the medical providers at [facility] strive to bring you the best medical care.  In doing so we not only want to address your current medical conditions and concerns but also to detect new conditions early and prevent illness, disease and health-related problems.    Medicare offers a yearly Wellness Visit which allows our clinical staff to assess your need for preventative services including immunizations, lifestyle education, counseling to decrease risk of preventable diseases and screening for fall risk and other medical concerns.    This visit is provided free of charge (no copay) for all Medicare recipients. The clinical pharmacists at [facility] have begun to conduct these Wellness Visits which will also include a thorough review of all your medications.    As you primary medical provider recommend that you make an appointment for your Annual Wellness Visit if you have not done so already this year.  You may set up this appointment before you leave today or you may call back ([phone]) and schedule an appointment.  Please make sure when you call that you mention that you are scheduling your Annual Wellness Visit with the clinical pharmacist so that the appointment may be made for the proper length of time.     Continue current medications. Continue good therapeutic lifestyle changes which include good diet and exercise. Fall precautions discussed with patient. If an FOBT was given today- please return it to our front desk. If you are over 81 years old - you may need Prevnar 32 or the adult Pneumonia vaccine.  **Flu shots are available--- please call and schedule a FLU-CLINIC appointment**  After your visit with [place] today you will receive a survey in the mail or online from [employer] regarding your care with [place]. Please take a moment to fill this out. Your feedback is very  important to [place] as you can help [place] better understand your patient needs as well as improve your experience and satisfaction. WE CARE ABOUT YOU!!!   Continue  to walk regularly Stay well-hydrated and drink plenty of water Follow-up with rheumatologist and neurologist as planned every 6 months

## 2018-02-10 NOTE — Progress Notes (Signed)
Subjective:    Patient ID: Troy Genre., male    DOB: [birth year], 72 y.o.   MRN: [id number]  HPI Pt here for follow up and management of chronic medical problems which includes hyperlipidemia. He is taking medication regularly.  The patient is doing well overall.  He has a seizure disorder and is followed regularly by the neurologist.  He also has BPH hyperlipidemia and hypothyroidism.  He is also diagnosed with rheumatoid arthritis.  Today he has no specific complaints and does not need any refills.  His vital signs are stable.  Is pleasant calm and relaxed and doing well.  He denies chest pain or shortness of breath.  He tries to walk regularly.  He says his age limits some of his activity.  He denies any trouble with his stomach including nausea vomiting diarrhea blood in the stool black tarry bowel movements or change in bowel habits.  He is passing his water without problems.  He denies any seizure activity and has not had any seizure activity for years.  He sees the neurologist regularly and he also sees the rheumatologist, Dr. Amil Amen because of his rheumatoid arthritis.  He says as far as his joints are concerned the hands seem to be the most involved.     Patient Active Problem List   Diagnosis Date Noted  . Epilepsy ([place]) 11/06/2015  . Hypothyroidism 05/04/2014  . Solitary pulmonary nodule RLL lateral basal segment  10/12/2013  . Rheumatoid arthritis ([place]) 08/19/2013  . High risk medication use 08/19/2013  . BPH (benign prostatic hyperplasia) 08/19/2013  . Seizure disorder ([place]) 05/06/2013  . Acute gangrenous cholecystitis 04/28/2013  . Abnormal EKG 04/22/2012  . Hyperlipidemia   . Depressive disorder, not elsewhere classified   . Localization-related (focal) (partial) epilepsy and epileptic syndromes with complex partial seizures, without mention of intractable epilepsy   . Kidney stone   . Impotence of organic origin   . External hemorrhoid   . Nasal septal perforation     . Cataract   . Hyperplasia of prostate    Outpatient Encounter Medications as of 02/10/2018  Medication Sig  . aspirin 81 MG EC tablet Take 81 mg by mouth daily.    Marland Kitchen atorvastatin (LIPITOR) 40 MG tablet TAKE 1 TABLET EVERY EVENING AS DIRECTED  . Cholecalciferol (VITAMIN D3) 2000 UNITS TABS Take 1 tablet twice Mon-Fri and 2 twice a day on Sat and Sun  . escitalopram (LEXAPRO) 20 MG tablet TAKE 1 TABLET BY MOUTH EVERY DAY  . ezetimibe (ZETIA) 10 MG tablet TAKE ONE TABLET BY MOUTH DAILY FOR HYPERLIPIDERMEA  . folic acid (FOLVITE) 1 MG tablet Take 1 mg by mouth 2 (two) times daily.   Marland Kitchen guaiFENesin (MUCINEX) 600 MG 12 hr tablet Take 600 mg by mouth as needed.   . lacosamide (VIMPAT) 200 MG TABS tablet Take 200 mg by mouth 2 (two) times daily.  Marland Kitchen levothyroxine (SYNTHROID, LEVOTHROID) 50 MCG tablet TAKE 1 TABLET BY MOUTH EVERY DAY  . methotrexate (RHEUMATREX) 2.5 MG tablet Take 10 mg by mouth once a week.   . omega-3 fish oil (MAXEPA) 1000 MG CAPS capsule Take 2 capsules by mouth daily.  Marland Kitchen omeprazole (PRILOSEC) 20 MG capsule TAKE 1 CAPSULE BY MOUTH EVERY DAY  . primidone (MYSOLINE) 250 MG tablet 250 mg. 1 in the am and 1 at bedtime   No facility-administered encounter medications on file as of 02/10/2018.      Review of Systems  Constitutional: Negative.   HENT:  Negative.   Eyes: Negative.   Respiratory: Negative.   Cardiovascular: Negative.   Gastrointestinal: Negative.   Endocrine: Negative.   Genitourinary: Negative.   Musculoskeletal: Negative.   Skin: Negative.   Allergic/Immunologic: Negative.   Neurological: Negative.   Hematological: Negative.   Psychiatric/Behavioral: Negative.        Objective:   Physical Exam  Constitutional: He is oriented to person, place, and time. He appears well-developed and well-nourished. No distress.  The patient is calm pleasant and relaxed.  HENT:  Head: Normocephalic and atraumatic.  Right Ear: External ear normal.  Left Ear: External  ear normal.  Nose: Nose normal.  Mouth/Throat: Oropharynx is clear and moist. No oropharyngeal exudate.  Ear canals clear of cerumen  Eyes: Pupils are equal, round, and reactive to light. Conjunctivae and EOM are normal. Right eye exhibits no discharge. Left eye exhibits no discharge. No scleral icterus.  Neck: Normal range of motion. Neck supple. No thyromegaly present.  No bruits thyromegaly or anterior cervical adenopathy  Cardiovascular: Normal rate, regular rhythm, normal heart sounds and intact distal pulses.  No murmur heard. Heart is regular at 60/min  Pulmonary/Chest: Effort normal and breath sounds normal. He has no wheezes. He has no rales. He exhibits no tenderness.  Clear anteriorly and posteriorly and no axillary adenopathy or chest wall masses  Abdominal: Soft. Bowel sounds are normal. He exhibits no mass. There is no tenderness.  No liver or spleen enlargement.  No epigastric tenderness.  No masses normal bowel sounds no bruits and no inguinal adenopathy  Musculoskeletal: Normal range of motion. He exhibits no edema.  Lymphadenopathy:    He has no cervical adenopathy.  Neurological: He is alert and oriented to person, place, and time. He has normal reflexes. No cranial nerve deficit. He exhibits normal muscle tone.  Reflexes are equal bilaterally.  Patient wears bilateral hearing aids.  Skin: Skin is warm and dry. No rash noted.  Psychiatric: He has a normal mood and affect. His behavior is normal. Judgment and thought content normal.  The patient's mood affect and behavior appeared normal and stable  Nursing note and vitals reviewed.   BP (!) 106/53 (BP Location: Left Arm)   Pulse (!) 57   Temp (!) 97.5 F (36.4 C) (Oral)   Ht 5' 4.45" (1.637 m)   Wt 150 lb (68 kg)   BMI 25.39 kg/m        Assessment & Plan:  1. Vitamin D deficiency -Continue current treatment pending results of lab work - CBC with Differential/Platelet - VITAMIN D 25 Hydroxy (Vit-D  Deficiency, Fractures)  2. Pure hypercholesterolemia -Continue current treatment pending results of lab work - BMP8+EGFR - CBC with Differential/Platelet - Lipid panel - Hepatic function panel  3. Benign prostatic hyperplasia, unspecified whether lower urinary tract symptoms present -No complaints today with voiding.  Prostate will be checked at next visit. - CBC with Differential/Platelet  4. Seizure disorder [facility]) -Patient sees neurologist, Dr. Merlene Laughter every 6 months.  Continue current treatment pending results of lab work - BMP8+EGFR - CBC with Differential/Platelet  5. Hypothyroidism, unspecified type -Continue current treatment pending results of lab work.  No complaints of fatigue or tiredness anymore than usual. - CBC with Differential/Platelet - Thyroid Panel With TSH  6. Rheumatoid arthritis involving multiple sites, unspecified rheumatoid factor presence ([place]) -Follow-up with rheumatologist every 6 months, Dr. Amil Amen.  Biggest complaints have to do speak with his hands.  Especially the right hand.  Patient Instructions  Medicare Annual Wellness Visit  [facility] and the medical providers at [facility] strive to bring you the best medical care.  In doing so we not only want to address your current medical conditions and concerns but also to detect new conditions early and prevent illness, disease and health-related problems.    Medicare offers a yearly Wellness Visit which allows our clinical staff to assess your need for preventative services including immunizations, lifestyle education, counseling to decrease risk of preventable diseases and screening for fall risk and other medical concerns.    This visit is provided free of charge (no copay) for all Medicare recipients. The clinical pharmacists at [facility] have begun to conduct these Wellness Visits which will also include a thorough review of all  your medications.    As you primary medical provider recommend that you make an appointment for your Annual Wellness Visit if you have not done so already this year.  You may set up this appointment before you leave today or you may call back ([phone]) and schedule an appointment.  Please make sure when you call that you mention that you are scheduling your Annual Wellness Visit with the clinical pharmacist so that the appointment may be made for the proper length of time.     Continue current medications. Continue good therapeutic lifestyle changes which include good diet and exercise. Fall precautions discussed with patient. If an FOBT was given today- please return it to our front desk. If you are over 13 years old - you may need Prevnar 58 or the adult Pneumonia vaccine.  **Flu shots are available--- please call and schedule a FLU-CLINIC appointment**  After your visit with [place] today you will receive a survey in the mail or online from [employer] regarding your care with [place]. Please take a moment to fill this out. Your feedback is very important to [place] as you can help [place] better understand your patient needs as well as improve your experience and satisfaction. WE CARE ABOUT YOU!!!   Continue  to walk regularly Stay well-hydrated and drink plenty of water Follow-up with rheumatologist and neurologist as planned every 6 months  Arrie Senate MD

## 2018-02-11 LAB — BMP8+EGFR
BUN/Creatinine Ratio: 13 (ref 10–24)
BUN: 14 mg/dL (ref 8–27)
CHLORIDE: 99 mmol/L (ref 96–106)
CO2: 27 mmol/L (ref 20–29)
Calcium: 9.3 mg/dL (ref 8.6–10.2)
Creatinine, Ser: 1.08 mg/dL (ref 0.76–1.27)
GFR calc non Af Amer: 68 mL/min/{1.73_m2} (ref >59)
GFR, EST AFRICAN AMERICAN: 79 mL/min/{1.73_m2} (ref >59)
GLUCOSE: 89 mg/dL (ref 65–99)
POTASSIUM: 4.6 mmol/L (ref 3.5–5.2)
SODIUM: 140 mmol/L (ref 134–144)

## 2018-02-11 LAB — CBC WITH DIFFERENTIAL/PLATELET
Basophils Absolute: 0 10*3/uL (ref 0.0–0.2)
Basos: 0 %
EOS (ABSOLUTE): 0.1 10*3/uL (ref 0.0–0.4)
Eos: 2 %
Hematocrit: 39.2 % (ref 37.5–51.0)
Hemoglobin: 13.1 g/dL (ref 13.0–17.7)
Immature Grans (Abs): 0 10*3/uL (ref 0.0–0.1)
Immature Granulocytes: 0 %
LYMPHS ABS: 1.9 10*3/uL (ref 0.7–3.1)
Lymphs: 26 %
MCH: 34.4 pg — AB (ref 26.6–33.0)
MCHC: 33.4 g/dL (ref 31.5–35.7)
MCV: 103 fL — ABNORMAL HIGH (ref 79–97)
MONOS ABS: 0.8 10*3/uL (ref 0.1–0.9)
Monocytes: 12 %
NEUTROS ABS: 4.3 10*3/uL (ref 1.4–7.0)
Neutrophils: 60 %
Platelets: 283 10*3/uL (ref 150–450)
RBC: 3.81 x10E6/uL — AB (ref 4.14–5.80)
RDW: 13.2 % (ref 12.3–15.4)
WBC: 7.1 10*3/uL (ref 3.4–10.8)

## 2018-02-11 LAB — THYROID PANEL WITH TSH
Free Thyroxine Index: 1.9 (ref 1.2–4.9)
T3 Uptake Ratio: 27 % (ref 24–39)
T4, Total: 7 ug/dL (ref 4.5–12.0)
TSH: 3.91 u[IU]/mL (ref 0.450–4.500)

## 2018-02-11 LAB — HEPATIC FUNCTION PANEL
ALBUMIN: 4.4 g/dL (ref 3.5–4.8)
ALK PHOS: 76 IU/L (ref 39–117)
ALT: 31 IU/L (ref 0–44)
AST: 37 IU/L (ref 0–40)
BILIRUBIN, DIRECT: 0.09 mg/dL (ref 0.00–0.40)
Bilirubin Total: 0.3 mg/dL (ref 0.0–1.2)
TOTAL PROTEIN: 7 g/dL (ref 6.0–8.5)

## 2018-02-11 LAB — LIPID PANEL
CHOLESTEROL TOTAL: 135 mg/dL (ref 100–199)
Chol/HDL Ratio: 2.5 ratio (ref 0.0–5.0)
HDL: 55 mg/dL (ref >39)
LDL Calculated: 63 mg/dL (ref 0–99)
Triglycerides: 86 mg/dL (ref 0–149)
VLDL Cholesterol Cal: 17 mg/dL (ref 5–40)

## 2018-02-11 LAB — VITAMIN D 25 HYDROXY (VIT D DEFICIENCY, FRACTURES): Vit D, 25-Hydroxy: 54.5 ng/mL (ref 30.0–100.0)

## 2018-02-14 ENCOUNTER — Other Ambulatory Visit: Payer: Self-pay | Admitting: Family Medicine

## 2018-02-15 ENCOUNTER — Other Ambulatory Visit: Payer: Self-pay | Admitting: Family Medicine

## 2018-04-02 ENCOUNTER — Other Ambulatory Visit: Payer: Medicare Other

## 2018-04-27 ENCOUNTER — Ambulatory Visit (INDEPENDENT_AMBULATORY_CARE_PROVIDER_SITE_OTHER): Payer: Medicare Other

## 2018-04-27 DIAGNOSIS — Z23 Encounter for immunization: Secondary | ICD-10-CM | POA: Diagnosis not present

## 2018-04-30 ENCOUNTER — Other Ambulatory Visit: Payer: Self-pay | Admitting: Family Medicine

## 2018-06-01 ENCOUNTER — Ambulatory Visit: Payer: Medicare Other | Admitting: Family Medicine

## 2018-06-01 ENCOUNTER — Encounter: Payer: Self-pay | Admitting: Family Medicine

## 2018-06-01 VITALS — BP 119/67 | HR 69 | Temp 97.5°F | Ht 64.45 in | Wt 154.0 lb

## 2018-06-01 DIAGNOSIS — Z4802 Encounter for removal of sutures: Secondary | ICD-10-CM | POA: Diagnosis not present

## 2018-06-01 DIAGNOSIS — S0080XA Unspecified superficial injury of other part of head, initial encounter: Secondary | ICD-10-CM | POA: Diagnosis not present

## 2018-06-01 DIAGNOSIS — Z23 Encounter for immunization: Secondary | ICD-10-CM

## 2018-06-01 NOTE — Progress Notes (Signed)
Subjective:    Patient ID: Troy Rodgers., male    DOB: [birth year], 72 y.o.   MRN: [id number]  Chief Complaint:  Suture / Staple Removal (fell on 05/24/18 while coming down steps at grandchild's choral performance; no LOC)   HPI: Troy Rodgers. is a 72 y.o. male presenting on 06/01/2018 for Suture / Staple Removal (fell on 05/24/18 while coming down steps at grandchild's choral performance; no LOC)  Pt presents today for staple removal. States he fell down a few stairs at an event on 05/24/18, states he hit his head causing a laceration. He denies LOC. States he was seen at [facility] in the ED and had the wound repaired with staples. Pt states he has been doing wound care as discussed. States he does not have any pain, drainage, swelling, or erythema to site.   Relevant past medical, surgical, family, and social history reviewed and updated as indicated.  Allergies and medications reviewed and updated.   Past Medical History:  Diagnosis Date  . Abdominal pain   . Arthritis    rheumatoid   . Asthma   . Bradycardia   . Cataract   . Chest pain   . Cholelithiases   . Depressive disorder, not elsewhere classified   . GERD (gastroesophageal reflux disease)   . Hearing deficit, bilateral   . HLD (hyperlipidemia)   . Hyperplasia of prostate   . Impotence of organic origin   . Kidney stone   . Localization-related (focal) (partial) epilepsy and epileptic syndromes with complex partial seizures, without mention of intractable epilepsy   . Nasal septal perforation   . Other and unspecified hyperlipidemia   . Seizures ([place] )   . Thyroid disease     Past Surgical History:  Procedure Laterality Date  . CHOLECYSTECTOMY  04/28/2013   Procedure: LAPAROSCOPIC CHOLECYSTECTOMY;  Surgeon: Ralene Ok, MD;  Location: [place];  Service: General;;  . LEFT SHOULDER SURGERY  1996    Social History   Socioeconomic History  . Marital status: Married    Spouse name: Bethena Roys   .  Number of children: 2  . Years of education: Not on file  . Highest education level: Not on file  Occupational History  . 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 Financial resource strain: Not on file  . Food insecurity:    Worry: Not on file    Inability: Not on file  . Transportation needs:    Medical: Not on file    Non-medical: Not on file  Tobacco Use  . Smoking status: Never Smoker  . Smokeless tobacco: Never Used  Substance and Sexual Activity  . Alcohol use: No  . Drug use: No  . Sexual activity: Not on file  Lifestyle  . Physical activity:    Days per week: Not on file    Minutes per session: Not on file  . Stress: Not on file  Relationships  . Social connections:    Talks on phone: Not on file    Gets together: Not on file    Attends religious service: Not on file    Active member of club or organization: Not on file    Attends meetings of clubs or organizations: Not on file    Relationship status: Not on file  . Intimate partner violence:    Fear of current or ex partner: Not on file  Emotionally abused: Not on file    Physically abused: Not on file    Forced sexual activity: Not on file  Other Topics Concern  . Not on file  Social History Narrative   Lives with wife.      Outpatient Encounter Medications as of 06/01/2018  Medication Sig  . aspirin 81 MG EC tablet Take 81 mg by mouth daily.    Marland Kitchen atorvastatin (LIPITOR) 40 MG tablet TAKE 1 TABLET EVERY EVENING AS DIRECTED  . Cholecalciferol (VITAMIN D3) 2000 UNITS TABS Take 1 tablet twice Mon-Fri and 2 twice a day on Sat and Sun  . escitalopram (LEXAPRO) 20 MG tablet TAKE 1 TABLET BY MOUTH EVERY DAY  . ezetimibe (ZETIA) 10 MG tablet TAKE ONE TABLET BY MOUTH DAILY FOR HYPERLIPIDERMEA  . folic acid (FOLVITE) 1 MG tablet Take 1 mg by mouth 2 (two) times daily.   Marland Kitchen guaiFENesin (MUCINEX) 600 MG 12 hr tablet Take 600 mg by mouth as needed.   .  lacosamide (VIMPAT) 200 MG TABS tablet Take 200 mg by mouth 2 (two) times daily.  Marland Kitchen levothyroxine (SYNTHROID, LEVOTHROID) 50 MCG tablet TAKE 1 TABLET BY MOUTH EVERY DAY  . methotrexate (RHEUMATREX) 2.5 MG tablet Take 10 mg by mouth once a week.   . omega-3 fish oil (MAXEPA) 1000 MG CAPS capsule Take 2 capsules by mouth daily.  Marland Kitchen omeprazole (PRILOSEC) 20 MG capsule TAKE 1 CAPSULE BY MOUTH EVERY DAY  . primidone (MYSOLINE) 250 MG tablet 250 mg. 1 in the am and 1 at bedtime   No facility-administered encounter medications on file as of 06/01/2018.     Allergies  Allergen Reactions  . Fentanyl Other (See Comments)    In high doses of 100 mcg pt's O2 sats decrease to 60% RA  . Hydrocodone     Intolerance     Review of Systems  Constitutional: Negative for chills, fatigue and fever.  Skin: Positive for wound (left parietal scalp).  Neurological: Negative for dizziness, syncope, weakness, light-headedness and headaches.  Psychiatric/Behavioral: Negative for confusion.  All other systems reviewed and are negative.       Objective:    BP 119/67   Pulse 69   Temp (!) 97.5 F (36.4 C) (Oral)   Ht 5' 4.45" (1.637 m)   Wt 154 lb (69.9 kg)   BMI 26.07 kg/m    Wt Readings from Last 3 Encounters:  06/01/18 154 lb (69.9 kg)  02/10/18 150 lb (68 kg)  10/29/17 155 lb (70.3 kg)    Physical Exam  Constitutional: He is oriented to person, place, and time. He appears well-developed and well-nourished. He is cooperative. No distress.  HENT:  Head: Normocephalic.  Right Ear: Decreased hearing is noted.  Left Ear: Decreased hearing is noted.  Eyes: Pupils are equal, round, and reactive to light. Conjunctivae and EOM are normal.  Cardiovascular: Normal rate, regular rhythm and normal heart sounds.  Pulmonary/Chest: Effort normal and breath sounds normal.  Neurological: He is alert and oriented to person, place, and time.  Skin: Skin is warm and dry. Capillary refill takes less than 2  seconds.     4 staples moved from left parietal scalp wound, staples intact.   Psychiatric: He has a normal mood and affect. His behavior is normal. Judgment and thought content normal.  Nursing note and vitals reviewed.   Results for orders placed or performed in visit on 02/10/18  [facility]  Result Value Ref Range   Glucose 89 65 - 99  mg/dL   BUN 14 8 - 27 mg/dL   Creatinine, Ser 1.08 0.76 - 1.27 mg/dL   GFR calc non Af Amer 68 >59 mL/min/1.73   GFR calc Af Amer 79 >59 mL/min/1.73   BUN/Creatinine Ratio 13 10 - 24   Sodium 140 134 - 144 mmol/L   Potassium 4.6 3.5 - 5.2 mmol/L   Chloride 99 96 - 106 mmol/L   CO2 27 20 - 29 mmol/L   Calcium 9.3 8.6 - 10.2 mg/dL  CBC with Differential/Platelet  Result Value Ref Range   WBC 7.1 3.4 - 10.8 x10E3/uL   RBC 3.81 (L) 4.14 - 5.80 x10E6/uL   Hemoglobin 13.1 13.0 - 17.7 g/dL   Hematocrit 39.2 37.5 - 51.0 %   MCV 103 (H) 79 - 97 fL   MCH 34.4 (H) 26.6 - 33.0 pg   MCHC 33.4 31.5 - 35.7 g/dL   RDW 13.2 12.3 - 15.4 %   Platelets 283 150 - 450 x10E3/uL   Neutrophils 60 Not Estab. %   Lymphs 26 Not Estab. %   Monocytes 12 Not Estab. %   Eos 2 Not Estab. %   Basos 0 Not Estab. %   Neutrophils Absolute 4.3 1.4 - 7.0 x10E3/uL   Lymphocytes Absolute 1.9 0.7 - 3.1 x10E3/uL   Monocytes Absolute 0.8 0.1 - 0.9 x10E3/uL   EOS (ABSOLUTE) 0.1 0.0 - 0.4 x10E3/uL   Basophils Absolute 0.0 0.0 - 0.2 x10E3/uL   Immature Granulocytes 0 Not Estab. %   Immature Grans (Abs) 0.0 0.0 - 0.1 x10E3/uL  Lipid panel  Result Value Ref Range   Cholesterol, Total 135 100 - 199 mg/dL   Triglycerides 86 0 - 149 mg/dL   HDL 55 >39 mg/dL   VLDL Cholesterol Cal 17 5 - 40 mg/dL   LDL Calculated 63 0 - 99 mg/dL   Chol/HDL Ratio 2.5 0.0 - 5.0 ratio  VITAMIN D 25 Hydroxy (Vit-D Deficiency, Fractures)  Result Value Ref Range   Vit D, 25-Hydroxy 54.5 30.0 - 100.0 ng/mL  Hepatic function panel  Result Value Ref Range   Total Protein 7.0 6.0 - 8.5 g/dL   Albumin 4.4  3.5 - 4.8 g/dL   Bilirubin Total 0.3 0.0 - 1.2 mg/dL   Bilirubin, Direct 0.09 0.00 - 0.40 mg/dL   Alkaline Phosphatase 76 39 - 117 IU/L   AST 37 0 - 40 IU/L   ALT 31 0 - 44 IU/L  Thyroid Panel With TSH  Result Value Ref Range   TSH 3.910 0.450 - 4.500 uIU/mL   T4, Total 7.0 4.5 - 12.0 ug/dL   T3 Uptake Ratio 27 24 - 39 %   Free Thyroxine Index 1.9 1.2 - 4.9       Pertinent labs & imaging results that were available during my care of the patient were reviewed by me and considered in my medical decision making.  Assessment & Plan:  Nikalas was seen today for suture / staple removal.  Diagnoses and all orders for this visit:  Encounter for staple removal 4 staples removed from left parietal scalp. Wound care discussed.   Need for viral immunization -     Tdap vaccine greater than or equal to 7yo IM      Continue all other maintenance medications.  Follow up plan: Return if symptoms worsen or fail to improve.  Educational handout given for Tdap, wound care  The above assessment and management plan was discussed with the patient. The patient verbalized understanding of  and has agreed to the management plan. Patient is aware to call the clinic if symptoms persist or worsen. Patient is aware when to return to the clinic for a follow-up visit. Patient educated on when it is appropriate to go to the emergency department.   Monia Pouch, FNP-C [facility] [phone]

## 2018-06-01 NOTE — Patient Instructions (Addendum)
Tdap Vaccine (Tetanus, Diphtheria and Pertussis): What You Need to Know 1. Why get vaccinated? Tetanus, diphtheria and pertussis are very serious diseases. Tdap vaccine can protect us from these diseases. And, Tdap vaccine given to pregnant women can protect newborn babies against pertussis. TETANUS (Lockjaw) is rare in the [place] today. It causes painful muscle tightening and stiffness, usually all over the body.  It can lead to tightening of muscles in the head and neck so you can't open your mouth, swallow, or sometimes even breathe. Tetanus kills about 1 out of 10 people who are infected even after receiving the best medical care.  DIPHTHERIA is also rare in the [place] today. It can cause a thick coating to form in the back of the throat.  It can lead to breathing problems, heart failure, paralysis, and death.  PERTUSSIS (Whooping Cough) causes severe coughing spells, which can cause difficulty breathing, vomiting and disturbed sleep.  It can also lead to weight loss, incontinence, and rib fractures. Up to 2 in 100 adolescents and 5 in 100 adults with pertussis are hospitalized or have complications, which could include pneumonia or death.  These diseases are caused by bacteria. Diphtheria and pertussis are spread from person to person through secretions from coughing or sneezing. Tetanus enters the body through cuts, scratches, or wounds. Before vaccines, as many as 200,000 cases of diphtheria, 200,000 cases of pertussis, and hundreds of cases of tetanus, were reported in the [place] each year. Since vaccination began, reports of cases for tetanus and diphtheria have dropped by about 99% and for pertussis by about 80%. 2. Tdap vaccine Tdap vaccine can protect adolescents and adults from tetanus, diphtheria, and pertussis. One dose of Tdap is routinely given at age 11 or 12. People who did not get Tdap at that age should get it as soon as possible. Tdap is especially  important for healthcare professionals and anyone having close contact with a baby younger than 12 months. Pregnant women should get a dose of Tdap during every pregnancy, to protect the newborn from pertussis. Infants are most at risk for severe, life-threatening complications from pertussis. Another vaccine, called Td, protects against tetanus and diphtheria, but not pertussis. A Td booster should be given every 10 years. Tdap may be given as one of these boosters if you have never gotten Tdap before. Tdap may also be given after a severe cut or burn to prevent tetanus infection. Your doctor or the person giving you the vaccine can give you more information. Tdap may safely be given at the same time as other vaccines. 3. Some people should not get this vaccine  A person who has ever had a life-threatening allergic reaction after a previous dose of any diphtheria, tetanus or pertussis containing vaccine, OR has a severe allergy to any part of this vaccine, should not get Tdap vaccine. Tell the person giving the vaccine about any severe allergies.  Anyone who had coma or long repeated seizures within 7 days after a childhood dose of DTP or DTaP, or a previous dose of Tdap, should not get Tdap, unless a cause other than the vaccine was found. They can still get Td.  Talk to your doctor if you: ? have seizures or another nervous system problem, ? had severe pain or swelling after any vaccine containing diphtheria, tetanus or pertussis, ? ever had a condition called Guillain-Barr Syndrome (GBS), ? aren't feeling well on the day the shot is scheduled. 4. Risks With any medicine, including   vaccines, there is a chance of side effects. These are usually mild and go away on their own. Serious reactions are also possible but are rare. Most people who get Tdap vaccine do not have any problems with it. Mild problems following Tdap: (Did not interfere with activities)  Pain where the shot was given (about  3 in 4 adolescents or 2 in 3 adults)  Redness or swelling where the shot was given (about 1 person in 5)  Mild fever of at least 100.4F (up to about 1 in 25 adolescents or 1 in 100 adults)  Headache (about 3 or 4 people in 10)  Tiredness (about 1 person in 3 or 4)  Nausea, vomiting, diarrhea, stomach ache (up to 1 in 4 adolescents or 1 in 10 adults)  Chills, sore joints (about 1 person in 10)  Body aches (about 1 person in 3 or 4)  Rash, swollen glands (uncommon)  Moderate problems following Tdap: (Interfered with activities, but did not require medical attention)  Pain where the shot was given (up to 1 in 5 or 6)  Redness or swelling where the shot was given (up to about 1 in 16 adolescents or 1 in 12 adults)  Fever over 102F (about 1 in 100 adolescents or 1 in 250 adults)  Headache (about 1 in 7 adolescents or 1 in 10 adults)  Nausea, vomiting, diarrhea, stomach ache (up to 1 or 3 people in 100)  Swelling of the entire arm where the shot was given (up to about 1 in 500).  Severe problems following Tdap: (Unable to perform usual activities; required medical attention)  Swelling, severe pain, bleeding and redness in the arm where the shot was given (rare).  Problems that could happen after any vaccine:  People sometimes faint after a medical procedure, including vaccination. Sitting or lying down for about 15 minutes can help prevent fainting, and injuries caused by a fall. Tell your doctor if you feel dizzy, or have vision changes or ringing in the ears.  Some people get severe pain in the shoulder and have difficulty moving the arm where a shot was given. This happens very rarely.  Any medication can cause a severe allergic reaction. Such reactions from a vaccine are very rare, estimated at fewer than 1 in a million doses, and would happen within a few minutes to a few hours after the vaccination. As with any medicine, there is a very remote chance of a vaccine  causing a serious injury or death. The safety of vaccines is always being monitored. For more information, visit: www.cdc.gov/vaccinesafety/ 5. What if there is a serious problem? What should I look for? Look for anything that concerns you, such as signs of a severe allergic reaction, very high fever, or unusual behavior. Signs of a severe allergic reaction can include hives, swelling of the face and throat, difficulty breathing, a fast heartbeat, dizziness, and weakness. These would usually start a few minutes to a few hours after the vaccination. What should I do?  If you think it is a severe allergic reaction or other emergency that can't wait, call 9-1-1 or get the person to the nearest hospital. Otherwise, call your doctor.  Afterward, the reaction should be reported to the Vaccine Adverse Event Reporting System (VAERS). Your doctor might file this report, or you can do it yourself through the VAERS web site at www.vaers.hhs.gov, or by calling 1-800-822-7967. ? VAERS does not give medical advice. 6. The National Vaccine Injury Compensation Program The National   Vaccine Injury Compensation Program (VICP) is a federal program that was created to compensate people who may have been injured by certain vaccines. Persons who believe they may have been injured by a vaccine can learn about the program and about filing a claim by calling (586)161-1524 or visiting the [place] website at GoldCloset.com.ee. There is a time limit to file a claim for compensation. 7. How can I learn more?  Ask your doctor. He or she can give you the vaccine package insert or suggest other sources of information.  Call your local or state health department.  Contact the Centers for Disease Control and Prevention (CDC): ? Call (269) 041-4659 (1-800-CDC-INFO) or ? Visit CDC's website at http://hunter.com/ CDC Tdap Vaccine VIS (09/07/13) This information is not intended to replace advice given to you by your  health care provider. Make sure you discuss any questions you have with your health care provider. Document Released: 12/31/2011 Document Revised: 03/21/2016 Document Reviewed: 03/21/2016 Elsevier Interactive Patient Education  2017 [place], Adult Taking care of your wound properly can help to prevent pain and infection. It can also help your wound to heal more quickly. How is this treated? Wound care  Follow instructions from your health care provider about how to take care of your wound. Make sure you: ? Wash your hands with soap and water before you change the bandage (dressing). If soap and water are not available, use hand sanitizer. ? Change your dressing as told by your health care provider. ? Leave stitches (sutures), skin glue, or adhesive strips in place. These skin closures may need to stay in place for 2 weeks or longer. If adhesive strip edges start to loosen and curl up, you may trim the loose edges. Do not remove adhesive strips completely unless your health care provider tells you to do that.  Check your wound area every day for signs of infection. Check for: ? More redness, swelling, or pain. ? More fluid or blood. ? Warmth. ? Pus or a bad smell.  Ask your health care provider if you should clean the wound with mild soap and water. Doing this may include: ? Using a clean towel to pat the wound dry after cleaning it. Do not rub or scrub the wound. ? Applying a cream or ointment. Do this only as told by your health care provider. ? Covering the incision with a clean dressing.  Ask your health care provider when you can leave the wound uncovered. Medicines   If you were prescribed an antibiotic medicine, cream, or ointment, take or use the antibiotic as told by your health care provider. Do not stop taking or using the antibiotic even if your condition improves.  Take over-the-counter and prescription medicines only as told by your health care provider.  If you were prescribed pain medicine, take it at least 30 minutes before doing any wound care or as told by your health care provider. General instructions  Return to your normal activities as told by your health care provider. Ask your health care provider what activities are safe.  Do not scratch or pick at the wound.  Keep all follow-up visits as told by your health care provider. This is important.  Eat a diet that includes protein, vitamin A, vitamin C, and other nutrient-rich foods. These help the wound heal: ? Protein-rich foods include meat, dairy, beans, nuts, and other sources. ? Vitamin A-rich foods include carrots and dark green, leafy vegetables. ? Vitamin C-rich foods include citrus, tomatoes,  and other fruits and vegetables. ? Nutrient-rich foods have protein, carbohydrates, fat, vitamins, or minerals. Eat a variety of healthy foods including vegetables, fruits, and whole grains. Contact a health care provider if:  You received a tetanus shot and you have swelling, severe pain, redness, or bleeding at the injection site.  Your pain is not controlled with medicine.  You have more redness, swelling, or pain around the wound.  You have more fluid or blood coming from the wound.  Your wound feels warm to the touch.  You have pus or a bad smell coming from the wound.  You have a fever or chills.  You are nauseous or you vomit.  You are dizzy. Get help right away if:  You have a red streak going away from your wound.  The edges of the wound open up and separate.  Your wound is bleeding and the bleeding does not stop with gentle pressure.  You have a rash.  You faint.  You have trouble breathing. This information is not intended to replace advice given to you by your health care provider. Make sure you discuss any questions you have with your health care provider. Document Released: 04/09/2008 Document Revised: 02/28/2016 Document Reviewed: 01/16/2016 Elsevier  Interactive Patient Education  2017 [employer].

## 2018-06-02 ENCOUNTER — Other Ambulatory Visit: Payer: Self-pay | Admitting: Family Medicine

## 2018-06-24 ENCOUNTER — Ambulatory Visit: Payer: Medicare Other | Admitting: Family Medicine

## 2018-07-29 ENCOUNTER — Ambulatory Visit: Payer: Medicare Other | Admitting: Family Medicine

## 2018-07-29 ENCOUNTER — Encounter: Payer: Self-pay | Admitting: Family Medicine

## 2018-07-29 VITALS — BP 109/66 | HR 63 | Temp 97.9°F | Ht 64.5 in | Wt 151.0 lb

## 2018-07-29 DIAGNOSIS — G40909 Epilepsy, unspecified, not intractable, without status epilepticus: Secondary | ICD-10-CM

## 2018-07-29 DIAGNOSIS — N400 Enlarged prostate without lower urinary tract symptoms: Secondary | ICD-10-CM | POA: Diagnosis not present

## 2018-07-29 DIAGNOSIS — E559 Vitamin D deficiency, unspecified: Secondary | ICD-10-CM

## 2018-07-29 DIAGNOSIS — Z0000 Encounter for general adult medical examination without abnormal findings: Secondary | ICD-10-CM

## 2018-07-29 DIAGNOSIS — E039 Hypothyroidism, unspecified: Secondary | ICD-10-CM

## 2018-07-29 DIAGNOSIS — E7800 Pure hypercholesterolemia, unspecified: Secondary | ICD-10-CM | POA: Diagnosis not present

## 2018-07-29 NOTE — Progress Notes (Signed)
Subjective:    Patient ID: Troy Genre., male    DOB: 1946/05/09, 73 y.o.   MRN: [id number]  HPI Pt here for CPE and  follow up on chronic medical problems which includes hyperlipidemia and hypothyroid. He is taking medication regularly.  Patient has a history of rheumatoid arthritis seizure disorder hyperlipidemia BPH hypothyroidism.  He sees Dr. Merlene Laughter and Dr. Amil Amen.  Several years ago he did have an abnormal CT which was later determined to be by Dr. Melvyn Novas I will rheumatoid nodule in the lung and he has had recent chest x-ray less than a year ago that was within normal limits.  He is taking atorvastatin vitamin D 2000 units Lexapro Zetia levothyroxine 50 mcg omeprazole and he takes primidone or Mysoline for his seizure disorder.  He also takes Vimpat.  The patient today denies any particular problems.  He says he stays active physically.  He denies any chest pain pressure or tightness.  He denies any shortness of breath or PND.  He denies any trouble with swallowing heartburn indigestion nausea vomiting diarrhea blood in the stool black tarry bowel movements or change in bowel habits.  When he had his last colonoscopy he was told that he did not need to have another one.  He is passing his water well and may have nocturia x1.  He has not had an eye exam in several years and I encouraged him to go get a good eye exam.    Patient Active Problem List   Diagnosis Date Noted  . Epilepsy ([place]) 11/06/2015  . Hypothyroidism 05/04/2014  . Solitary pulmonary nodule RLL lateral basal segment  10/12/2013  . Rheumatoid arthritis ([place]) 08/19/2013  . High risk medication use 08/19/2013  . BPH (benign prostatic hyperplasia) 08/19/2013  . Seizure disorder ([place]) 05/06/2013  . Acute gangrenous cholecystitis 04/28/2013  . Abnormal EKG 04/22/2012  . Hyperlipidemia   . Depressive disorder, not elsewhere classified   . Localization-related (focal) (partial) epilepsy and epileptic syndromes with complex  partial seizures, without mention of intractable epilepsy   . Kidney stone   . Impotence of organic origin   . External hemorrhoid   . Nasal septal perforation   . Cataract   . Hyperplasia of prostate    Outpatient Encounter Medications as of 07/29/2018  Medication Sig  . aspirin 81 MG EC tablet Take 81 mg by mouth daily.    Marland Kitchen atorvastatin (LIPITOR) 40 MG tablet TAKE 1 TABLET EVERY EVENING AS DIRECTED  . Cholecalciferol (VITAMIN D3) 2000 UNITS TABS Take 1 tablet twice Mon-Fri and 2 twice a day on Sat and Sun  . escitalopram (LEXAPRO) 20 MG tablet TAKE 1 TABLET BY MOUTH EVERY DAY  . ezetimibe (ZETIA) 10 MG tablet TAKE ONE TABLET BY MOUTH DAILY FOR HYPERLIPIDERMEA  . folic acid (FOLVITE) 1 MG tablet Take 1 mg by mouth 2 (two) times daily.   Marland Kitchen guaiFENesin (MUCINEX) 600 MG 12 hr tablet Take 600 mg by mouth as needed.   . lacosamide (VIMPAT) 200 MG TABS tablet Take 200 mg by mouth 2 (two) times daily.  Marland Kitchen levothyroxine (SYNTHROID, LEVOTHROID) 50 MCG tablet TAKE 1 TABLET BY MOUTH EVERY DAY  . methotrexate (RHEUMATREX) 2.5 MG tablet Take 10 mg by mouth once a week.   . omega-3 fish oil (MAXEPA) 1000 MG CAPS capsule Take 2 capsules by mouth daily.  Marland Kitchen omeprazole (PRILOSEC) 20 MG capsule TAKE 1 CAPSULE BY MOUTH EVERY DAY  . primidone (MYSOLINE) 250 MG tablet 250 mg. 1  in the am and 1 at bedtime   No facility-administered encounter medications on file as of 07/29/2018.      Review of Systems  Constitutional: Negative.   HENT: Negative.   Eyes: Negative.   Respiratory: Negative.   Cardiovascular: Negative.   Gastrointestinal: Negative.   Endocrine: Negative.   Genitourinary: Negative.   Musculoskeletal: Negative.   Skin: Negative.   Allergic/Immunologic: Negative.   Neurological: Negative.   Hematological: Negative.   Psychiatric/Behavioral: Negative.        Objective:   Physical Exam Vitals signs and nursing note reviewed.  Constitutional:      Appearance: Normal appearance. He is  well-developed and normal weight. He is not ill-appearing.  HENT:     Head: Normocephalic and atraumatic.     Right Ear: Tympanic membrane, ear canal and external ear normal. There is no impacted cerumen.     Left Ear: Tympanic membrane, ear canal and external ear normal.     Ears:     Comments: Wears bilateral hearing aids    Nose: Nose normal. No congestion or rhinorrhea.     Mouth/Throat:     Mouth: Mucous membranes are moist.     Pharynx: Oropharynx is clear. No oropharyngeal exudate.  Eyes:     General: No scleral icterus.       Right eye: No discharge.        Left eye: No discharge.     Extraocular Movements: Extraocular movements intact.     Conjunctiva/sclera: Conjunctivae normal.     Pupils: Pupils are equal, round, and reactive to light.  Neck:     Musculoskeletal: Normal range of motion and neck supple.     Thyroid: No thyromegaly.     Vascular: No carotid bruit.     Trachea: No tracheal deviation.  Cardiovascular:     Rate and Rhythm: Normal rate and regular rhythm.     Pulses: Normal pulses.     Heart sounds: Normal heart sounds. No murmur. No gallop.      Comments: Heart is regular at 72/min no edema and good pedal pulses Pulmonary:     Effort: Pulmonary effort is normal. No respiratory distress.     Breath sounds: Normal breath sounds. No wheezing or rales.     Comments: Clear anteriorly and posteriorly and no axillary adenopathy chest wall masses or tenderness Chest:     Chest wall: No tenderness.  Abdominal:     General: Abdomen is flat. Bowel sounds are normal.     Palpations: Abdomen is soft. There is no mass.     Tenderness: There is no abdominal tenderness. There is no guarding.     Comments: Abdomen is soft without spleen or liver enlargement masses bruits or inguinal adenopathy.  With good inguinal pulses  Genitourinary:    Penis: Normal.      Scrotum/Testes: Normal.     Prostate: Normal.     Rectum: Normal.     Comments: The prostate if enlarged is  only slightly enlarged.  There is no lumps or masses.  There is no rectal masses.  External genitalia were within normal limits and the testicles were normal with no inguinal hernias being palpated. Musculoskeletal: Normal range of motion.        General: Deformity present. No tenderness.     Right lower leg: No edema.     Left lower leg: No edema.     Comments: Thoracic kyphosis  Lymphadenopathy:     Cervical: No cervical adenopathy.  Skin:    General: Skin is warm and dry.     Findings: No rash.  Neurological:     Mental Status: He is alert and oriented to person, place, and time. Mental status is at baseline.     Cranial Nerves: No cranial nerve deficit.     Deep Tendon Reflexes: Reflexes are normal and symmetric. Reflexes normal.  Psychiatric:        Mood and Affect: Mood normal.        Behavior: Behavior normal.        Thought Content: Thought content normal.        Judgment: Judgment normal.    BP 109/66 (BP Location: Left Arm)   Pulse 63   Temp 97.9 F (36.6 C) (Oral)   Ht 5' 4.5" (1.638 m)   Wt 151 lb (68.5 kg)   BMI 25.52 kg/m         Assessment & Plan:  1. Pure hypercholesterolemia -Continue current treatment and as aggressive therapeutic lifestyle changes as possible including diet and exercise pending results of lab work - CMP14+EGFR - CBC with Differential/Platelet - Lipid panel  2. Benign prostatic hyperplasia, unspecified whether lower urinary tract symptoms present -Minimal prostate enlargement with only minimal symptoms of nocturia x1. - CBC with Differential/Platelet  3. Vitamin D deficiency -Continue vitamin D replacement pending results of lab work - CBC with Differential/Platelet - VITAMIN D 25 Hydroxy (Vit-D Deficiency, Fractures)  4. Seizure disorder (HCC) -Check Vimpat and primidone levels and forward copy to Dr. Merlene Laughter.  Continue to follow-up with Dr. Merlene Laughter. - CBC with Differential/Platelet - Primidone level  5. Hypothyroidism,  unspecified type -Continue current treatment pending results of lab work - CBC with Differential/Platelet - Thyroid Panel With TSH  6. Annual physical exam - CMP14+EGFR - CBC with Differential/Platelet - Lipid panel - VITAMIN D 25 Hydroxy (Vit-D Deficiency, Fractures) - Thyroid Panel With TSH  Patient Instructions                       Medicare Annual Wellness Visit  [facility] and the medical providers at [facility] strive to bring you the best medical care.  In doing so we not only want to address your current medical conditions and concerns but also to detect new conditions early and prevent illness, disease and health-related problems.    Medicare offers a yearly Wellness Visit which allows our clinical staff to assess your need for preventative services including immunizations, lifestyle education, counseling to decrease risk of preventable diseases and screening for fall risk and other medical concerns.    This visit is provided free of charge (no copay) for all Medicare recipients. The clinical pharmacists at [facility] have begun to conduct these Wellness Visits which will also include a thorough review of all your medications.    As you primary medical provider recommend that you make an appointment for your Annual Wellness Visit if you have not done so already this year.  You may set up this appointment before you leave today or you may call back ([phone]) and schedule an appointment.  Please make sure when you call that you mention that you are scheduling your Annual Wellness Visit with the clinical pharmacist so that the appointment may be made for the proper length of time.     Continue current medications. Continue good therapeutic lifestyle changes which include good diet and exercise. Fall precautions discussed with patient. If an FOBT was given  today- please return it to our front desk. If you are over 68 years old - you  may need Prevnar 55 or the adult Pneumonia vaccine.  **Flu shots are available--- please call and schedule a FLU-CLINIC appointment**  After your visit with [place] today you will receive a survey in the mail or online from [employer] regarding your care with [place]. Please take a moment to fill this out. Your feedback is very important to [place] as you can help [place] better understand your patient needs as well as improve your experience and satisfaction. WE CARE ABOUT YOU!!!   Stay active physically Drink plenty of water Stay well-hydrated Use good hand hygiene Keep the house as cool as possible Follow-up with Merlene Laughter and Amil Amen as currently doing Return the FOBT We will call with lab work results and urinalysis results as soon as these are reported Patient should get a good eye exam and make sure that we get a copy of that report sent to our office  Arrie Senate MD

## 2018-07-29 NOTE — Patient Instructions (Addendum)
Medicare Annual Wellness Visit  [facility] and the medical providers at [facility] strive to bring you the best medical care.  In doing so we not only want to address your current medical conditions and concerns but also to detect new conditions early and prevent illness, disease and health-related problems.    Medicare offers a yearly Wellness Visit which allows our clinical staff to assess your need for preventative services including immunizations, lifestyle education, counseling to decrease risk of preventable diseases and screening for fall risk and other medical concerns.    This visit is provided free of charge (no copay) for all Medicare recipients. The clinical pharmacists at [facility] have begun to conduct these Wellness Visits which will also include a thorough review of all your medications.    As you primary medical provider recommend that you make an appointment for your Annual Wellness Visit if you have not done so already this year.  You may set up this appointment before you leave today or you may call back ([phone]) and schedule an appointment.  Please make sure when you call that you mention that you are scheduling your Annual Wellness Visit with the clinical pharmacist so that the appointment may be made for the proper length of time.     Continue current medications. Continue good therapeutic lifestyle changes which include good diet and exercise. Fall precautions discussed with patient. If an FOBT was given today- please return it to our front desk. If you are over 42 years old - you may need Prevnar 24 or the adult Pneumonia vaccine.  **Flu shots are available--- please call and schedule a FLU-CLINIC appointment**  After your visit with [place] today you will receive a survey in the mail or online from [employer] regarding your care with [place]. Please take a moment to fill this out. Your feedback is very  important to [place] as you can help [place] better understand your patient needs as well as improve your experience and satisfaction. WE CARE ABOUT YOU!!!   Stay active physically Drink plenty of water Stay well-hydrated Use good hand hygiene Keep the house as cool as possible Follow-up with [place] and Amil Amen as currently doing Return the FOBT We will call with lab work results and urinalysis results as soon as these are reported Patient should get a good eye exam and make sure that we get a copy of that report sent to our office

## 2018-07-30 LAB — PRIMIDONE, SERUM
Phenobarbital, Serum: 24 ug/mL (ref 15–40)
Primidone Lvl: 16.7 ug/mL (ref 5.0–12.0)

## 2018-07-30 LAB — CMP14+EGFR
ALK PHOS: 79 IU/L (ref 39–117)
ALT: 29 IU/L (ref 0–44)
AST: 32 IU/L (ref 0–40)
Albumin/Globulin Ratio: 1.6 (ref 1.2–2.2)
Albumin: 4.5 g/dL (ref 3.5–4.8)
BUN/Creatinine Ratio: 13 (ref 10–24)
BUN: 15 mg/dL (ref 8–27)
Bilirubin Total: 0.3 mg/dL (ref 0.0–1.2)
CALCIUM: 9.5 mg/dL (ref 8.6–10.2)
CO2: 26 mmol/L (ref 20–29)
CREATININE: 1.13 mg/dL (ref 0.76–1.27)
Chloride: 101 mmol/L (ref 96–106)
GFR calc Af Amer: 75 mL/min/{1.73_m2} (ref >59)
GFR, EST NON AFRICAN AMERICAN: 65 mL/min/{1.73_m2} (ref >59)
GLUCOSE: 94 mg/dL (ref 65–99)
Globulin, Total: 2.8 g/dL (ref 1.5–4.5)
Potassium: 5 mmol/L (ref 3.5–5.2)
SODIUM: 141 mmol/L (ref 134–144)
Total Protein: 7.3 g/dL (ref 6.0–8.5)

## 2018-07-30 LAB — CBC WITH DIFFERENTIAL/PLATELET
Basophils Absolute: 0.1 10*3/uL (ref 0.0–0.2)
Basos: 1 %
EOS (ABSOLUTE): 0.1 10*3/uL (ref 0.0–0.4)
Eos: 1 %
Hematocrit: 37.4 % — ABNORMAL LOW (ref 37.5–51.0)
Hemoglobin: 13.1 g/dL (ref 13.0–17.7)
Immature Grans (Abs): 0 10*3/uL (ref 0.0–0.1)
Immature Granulocytes: 0 %
Lymphocytes Absolute: 2 10*3/uL (ref 0.7–3.1)
Lymphs: 27 %
MCH: 34.5 pg — ABNORMAL HIGH (ref 26.6–33.0)
MCHC: 35 g/dL (ref 31.5–35.7)
MCV: 98 fL — ABNORMAL HIGH (ref 79–97)
MONOS ABS: 0.9 10*3/uL (ref 0.1–0.9)
Monocytes: 13 %
Neutrophils Absolute: 4.4 10*3/uL (ref 1.4–7.0)
Neutrophils: 58 %
Platelets: 301 10*3/uL (ref 150–450)
RBC: 3.8 x10E6/uL — ABNORMAL LOW (ref 4.14–5.80)
RDW: 13 % (ref 11.6–15.4)
WBC: 7.5 10*3/uL (ref 3.4–10.8)

## 2018-07-30 LAB — LIPID PANEL
Chol/HDL Ratio: 2.3 ratio (ref 0.0–5.0)
Cholesterol, Total: 138 mg/dL (ref 100–199)
HDL: 60 mg/dL (ref >39)
LDL Calculated: 59 mg/dL (ref 0–99)
Triglycerides: 95 mg/dL (ref 0–149)
VLDL Cholesterol Cal: 19 mg/dL (ref 5–40)

## 2018-07-30 LAB — VITAMIN D 25 HYDROXY (VIT D DEFICIENCY, FRACTURES): Vit D, 25-Hydroxy: 56.8 ng/mL (ref 30.0–100.0)

## 2018-07-30 LAB — THYROID PANEL WITH TSH
Free Thyroxine Index: 1.7 (ref 1.2–4.9)
T3 Uptake Ratio: 24 % (ref 24–39)
T4, Total: 6.9 ug/dL (ref 4.5–12.0)
TSH: 1.98 u[IU]/mL (ref 0.450–4.500)

## 2018-08-13 ENCOUNTER — Other Ambulatory Visit: Payer: Self-pay | Admitting: Family Medicine

## 2018-08-23 ENCOUNTER — Other Ambulatory Visit: Payer: Self-pay | Admitting: Family Medicine

## 2018-08-25 ENCOUNTER — Other Ambulatory Visit: Payer: Medicare Other

## 2018-08-25 DIAGNOSIS — Z1211 Encounter for screening for malignant neoplasm of colon: Secondary | ICD-10-CM

## 2018-08-26 LAB — FECAL OCCULT BLOOD, IMMUNOCHEMICAL: Fecal Occult Bld: NEGATIVE

## 2018-10-14 ENCOUNTER — Other Ambulatory Visit: Payer: Self-pay | Admitting: Family Medicine

## 2018-11-16 ENCOUNTER — Other Ambulatory Visit: Payer: Self-pay | Admitting: Family Medicine

## 2018-12-15 ENCOUNTER — Ambulatory Visit (INDEPENDENT_AMBULATORY_CARE_PROVIDER_SITE_OTHER): Payer: Medicare Other | Admitting: Family Medicine

## 2018-12-15 ENCOUNTER — Other Ambulatory Visit: Payer: Self-pay

## 2018-12-15 ENCOUNTER — Encounter: Payer: Self-pay | Admitting: Family Medicine

## 2018-12-15 DIAGNOSIS — M069 Rheumatoid arthritis, unspecified: Secondary | ICD-10-CM

## 2018-12-15 DIAGNOSIS — K219 Gastro-esophageal reflux disease without esophagitis: Secondary | ICD-10-CM

## 2018-12-15 DIAGNOSIS — N400 Enlarged prostate without lower urinary tract symptoms: Secondary | ICD-10-CM

## 2018-12-15 DIAGNOSIS — R1310 Dysphagia, unspecified: Secondary | ICD-10-CM

## 2018-12-15 DIAGNOSIS — E559 Vitamin D deficiency, unspecified: Secondary | ICD-10-CM

## 2018-12-15 DIAGNOSIS — E782 Mixed hyperlipidemia: Secondary | ICD-10-CM

## 2018-12-15 DIAGNOSIS — E039 Hypothyroidism, unspecified: Secondary | ICD-10-CM

## 2018-12-15 DIAGNOSIS — G40909 Epilepsy, unspecified, not intractable, without status epilepticus: Secondary | ICD-10-CM

## 2018-12-15 DIAGNOSIS — F329 Major depressive disorder, single episode, unspecified: Secondary | ICD-10-CM

## 2018-12-15 DIAGNOSIS — F32A Depression, unspecified: Secondary | ICD-10-CM

## 2018-12-15 NOTE — Addendum Note (Signed)
Addended by: Zannie Cove on: 12/15/2018 02:54 PM   Modules accepted: Orders

## 2018-12-15 NOTE — Progress Notes (Signed)
Virtual Visit Via telephone Note I connected with@ on 12/15/18 by telephone and verified that I am speaking with the correct person or authorized healthcare agent using two identifiers. Troy Rodgers. is currently located at home and there are no unauthorized people in close proximity. I completed this visit while in a private location in my home..  This visit type was conducted due to national recommendations for restrictions regarding the COVID-19 Pandemic (e.g. social distancing).  This format is felt to be most appropriate for this patient at this time.  All issues noted in this document were discussed and addressed.  No physical exam was performed.    I discussed the limitations, risks, security and privacy concerns of performing an evaluation and management service by telephone and the availability of in person appointments. I also discussed with the patient that there may be a patient responsible charge related to this service. The patient expressed understanding and agreed to proceed.   Date:  12/15/2018    ID:  Troy Rodgers      04/04/1946        [id number]   Patient Care Team Patient Care Team: Chipper Herb, MD as PCP - General (Family Medicine) Katy Apo, MD as Consulting Physician (Ophthalmology) Phillips Odor, MD as Consulting Physician (Neurology) Minus Breeding, MD as Consulting Physician (Cardiology) Rogene Houston, MD as Consulting Physician (Gastroenterology) Hennie Duos, MD as Consulting Physician (Rheumatology) Tanda Rockers, MD as Consulting Physician (Pulmonary Disease)  Reason for Visit: Primary Care Follow-up     History of Present Illness & Review of Systems:     Troy Rodgers. is a 73 y.o. year old male primary care patient that presents today for a telehealth visit.  Review his current symptoms with her to see if there is anything else that needs to be done.  He did have a barium swallow in the past and apparently there was no  stricture only peristaltic issues.  The patient's wife was present during the visit.  He denies any chest pain pressure tightness or shortness of breath.  He does continue to have some issues with swallowing and the wife reaffirmed that he had a barium swallow in the past and had some therapy because there was no sign of any stricture just peristaltic issues.  Because this has persisted she will get back in touch with the swallowing specialist and pursue further work-up if needed.  He otherwise denies any nausea vomiting heartburn blood in the stool black tarry bowel movements or change in bowel habits.  He is passing his water well.  He has not had any seizure activity and does plan to see Dr. Merlene Laughter, his neurologist tomorrow and he sees him every 6 months.  Review of systems as stated, otherwise negative.  The patient does not have symptoms concerning for COVID-19 infection (fever, chills, cough, or new shortness of breath).      Current Medications (Verified) Allergies as of 12/15/2018      Reactions   Fentanyl Other (See Comments)   In high doses of 100 mcg pt's O2 sats decrease to 60% RA   Hydrocodone    Intolerance       Medication List       Accurate as of December 15, 2018  9:44 AM. If you have any questions, ask your nurse or doctor.        aspirin 81 MG EC tablet Take 81 mg by mouth daily.  atorvastatin 40 MG tablet Commonly known as:  LIPITOR TAKE 1 TABLET EVERY EVENING AS DIRECTED   escitalopram 20 MG tablet Commonly known as:  LEXAPRO TAKE 1 TABLET BY MOUTH EVERY DAY   ezetimibe 10 MG tablet Commonly known as:  ZETIA TAKE ONE TABLET BY MOUTH DAILY FOR HYPERLIPIDERMEA   folic acid 1 MG tablet Commonly known as:  FOLVITE Take 1 mg by mouth 2 (two) times daily.   lacosamide 200 MG Tabs tablet Commonly known as:  VIMPAT Take 200 mg by mouth 2 (two) times daily.   levothyroxine 50 MCG tablet Commonly known as:  SYNTHROID TAKE 1 TABLET BY MOUTH EVERY DAY    methotrexate 2.5 MG tablet Commonly known as:  RHEUMATREX Take 10 mg by mouth once a week.   Mucinex 600 MG 12 hr tablet Generic drug:  guaiFENesin Take 600 mg by mouth as needed.   omega-3 fish oil 1000 MG Caps capsule Commonly known as:  MAXEPA Take 2 capsules by mouth daily.   omeprazole 20 MG capsule Commonly known as:  PRILOSEC TAKE 1 CAPSULE BY MOUTH EVERY DAY   primidone 250 MG tablet Commonly known as:  MYSOLINE 250 mg. 1 in the am and 1 at bedtime   Vitamin D3 50 MCG (2000 UT) Tabs Take 1 tablet twice Mon-Fri and 2 twice a day on Sat and Sun           Allergies (Verified)    Fentanyl and Hydrocodone  Past Medical History Past Medical History:  Diagnosis Date  . Abdominal pain   . Arthritis    rheumatoid   . Asthma   . Bradycardia   . Cataract   . Chest pain   . Cholelithiases   . Depressive disorder, not elsewhere classified   . GERD (gastroesophageal reflux disease)   . Hearing deficit, bilateral   . HLD (hyperlipidemia)   . Hyperplasia of prostate   . Impotence of organic origin   . Kidney stone   . Localization-related (focal) (partial) epilepsy and epileptic syndromes with complex partial seizures, without mention of intractable epilepsy   . Nasal septal perforation   . Other and unspecified hyperlipidemia   . Seizures ([place])   . Thyroid disease      Past Surgical History:  Procedure Laterality Date  . CHOLECYSTECTOMY  04/28/2013   Procedure: LAPAROSCOPIC CHOLECYSTECTOMY;  Surgeon: Ralene Ok, MD;  Location: [facility];  Service: General;;  . LEFT SHOULDER SURGERY  1996    Social History   Socioeconomic History  . Marital status: Married    Spouse name: Troy Rodgers   . Number of children: 2  . Years of education: Not on file  . Highest education level: Not on file  Occupational History  . 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 Financial resource strain: Not on file  . Food  insecurity:    Worry: Not on file    Inability: Not on file  . Transportation needs:    Medical: Not on file    Non-medical: Not on file  Tobacco Use  . Smoking status: Never Smoker  . Smokeless tobacco: Never Used  Substance and Sexual Activity  . Alcohol use: No  . Drug use: No  . Sexual activity: Not on file  Lifestyle  . Physical activity:    Days per week: Not on file    Minutes per session: Not on file  . Stress: Not on file  Relationships  . Social connections:    Talks on phone: Not on file    Gets together: Not on file    Attends religious service: Not on file    Active member of club or organization: Not on file    Attends meetings of clubs or organizations: Not on file    Relationship status: Not on file  Other Topics Concern  . Not on file  Social History Narrative   Lives with wife.       Family History  Problem Relation Age of Onset  . Kidney failure Father        died age 50  . Kidney disease Father   . Heart disease Father        bypass surgery  . Colon cancer Mother   . Alzheimer's disease Mother   . Hyperlipidemia Mother   . Heart failure Sister        CHF  . Heart disease Sister   . Peripheral vascular disease Sister   . Rheum arthritis Sister   . Pancreatitis Sister   . Pneumonia Sister   . Hyperlipidemia Son   . Alzheimer's disease Maternal Grandmother   . Peripheral vascular disease Sister   . Anxiety disorder Son       Labs/Other Tests and Data Reviewed:    Wt Readings from Last 3 Encounters:  07/29/18 151 lb (68.5 kg)  06/01/18 154 lb (69.9 kg)  02/10/18 150 lb (68 kg)   Temp Readings from Last 3 Encounters:  07/29/18 97.9 F (36.6 C) (Oral)  06/01/18 (!) 97.5 F (36.4 C) (Oral)  02/10/18 (!) 97.5 F (36.4 C) (Oral)   BP Readings from Last 3 Encounters:  07/29/18 109/66  06/01/18 119/67  02/10/18 (!) 106/53   Pulse Readings from Last 3 Encounters:  07/29/18 63  06/01/18 69  02/10/18 (!) 57     No results found  for: HGBA1C Lab Results  Component Value Date   LDLCALC 59 07/29/2018   CREATININE 1.13 07/29/2018       Chemistry      Component Value Date/Time   NA 141 07/29/2018 1444   K 5.0 07/29/2018 1444   CL 101 07/29/2018 1444   CO2 26 07/29/2018 1444   BUN 15 07/29/2018 1444   CREATININE 1.13 07/29/2018 1444   CREATININE 1.02 01/14/2013 1050      Component Value Date/Time   CALCIUM 9.5 07/29/2018 1444   ALKPHOS 79 07/29/2018 1444   AST 32 07/29/2018 1444   ALT 29 07/29/2018 1444   BILITOT 0.3 07/29/2018 1444         OBSERVATIONS/ OBJECTIVE:     The patient wears hearing aids.  His wife assisted in the conversation with him.  His weight is about 144.  They did not check blood pressures at home.  He understands that he needs to follow-up only if needed with the cardiologist and only follows up regularly with Dr. Amil Amen and Dr. Merlene Laughter.  His arthritic complaints are stable.  He is on methotrexate and needs periodic lab work.  His wife says he has had more chills and cold symptoms recently as far as cold intolerance is concerned and we did review that we checked a thyroid profile 11 months ago.  We will do another one with this blood draw.  He will need a rectal exam at his next visit.  Physical exam deferred due to nature of telephonic visit.  ASSESSMENT & PLAN    Time:   Today, I have  spen 28 minutes with the patient via telephone discussing the above including Covid precautions.     Visit Diagnoses: 1. Mixed hyperlipidemia -Continue with aggressive therapeutic lifestyle changes Zetia and statin.  2. Vitamin D deficiency -Continue with vitamin D replacement pending results of lab work  3. Hypothyroidism, unspecified type -Patient has been having some cold intolerance recently more than usual.  Will make sure we check a thyroid and that this level is within normal limits.  4. Rheumatoid arthritis involving multiple sites, unspecified rheumatoid factor presence (HCC)  -Follow-up with Dr. Amil Amen as planned  5. Seizure disorder [facility]) -Follow-up with Dr. Merlene Laughter as planned.  Patient has not had any seizure activity that is reported by him or his wife.  6. Benign prostatic hyperplasia, unspecified whether lower urinary tract symptoms present -Patient is voiding well and having no problems with passing his water.  7. Benign prostatic hyperplasia without lower urinary tract symptoms -Voiding well.  Does need PSA and needs rectal exam at next visit.  8.  Swallowing problems -Patient's wife will call swallowing therapist  Patient Instructions  Continue to watch diet closely and keep weight down through exercise Continue to practice good hand and respiratory hygiene Continue to follow-up with neurology as planned Continue follow-up with Dr. Percival Spanish, Dr. Laural Golden, and Dr. Amil Amen Stay active physically and do not put yourself at risk for falling     The above assessment and management plan was discussed with the patient. The patient verbalized understanding of and has agreed to the management plan. Patient is aware to call the clinic if symptoms persist or worsen. Patient is aware when to return to the clinic for a follow-up visit. Patient educated on when it is appropriate to go to the emergency department.    Chipper Herb, MD [address], [place], [place] [address] Ph [phone]   Arrie Senate MD

## 2018-12-15 NOTE — Patient Instructions (Addendum)
Continue to watch diet closely and keep weight down through exercise Continue to practice good hand and respiratory hygiene Continue to follow-up with neurology as planned Continue follow-up with Dr. Percival Spanish, Dr. Laural Golden, and Dr. Amil Amen Stay active physically and do not put yourself at risk for falling

## 2018-12-18 ENCOUNTER — Telehealth: Payer: Self-pay | Admitting: Family Medicine

## 2018-12-18 NOTE — Telephone Encounter (Signed)
Please review and advise.

## 2018-12-21 NOTE — Telephone Encounter (Signed)
Pt aware of provider  And labs

## 2018-12-22 ENCOUNTER — Other Ambulatory Visit: Payer: Self-pay

## 2018-12-22 ENCOUNTER — Other Ambulatory Visit: Payer: Medicare Other

## 2018-12-22 DIAGNOSIS — R1310 Dysphagia, unspecified: Secondary | ICD-10-CM

## 2018-12-22 DIAGNOSIS — F329 Major depressive disorder, single episode, unspecified: Secondary | ICD-10-CM

## 2018-12-22 DIAGNOSIS — E039 Hypothyroidism, unspecified: Secondary | ICD-10-CM

## 2018-12-22 DIAGNOSIS — E559 Vitamin D deficiency, unspecified: Secondary | ICD-10-CM

## 2018-12-22 DIAGNOSIS — N400 Enlarged prostate without lower urinary tract symptoms: Secondary | ICD-10-CM

## 2018-12-22 DIAGNOSIS — F32A Depression, unspecified: Secondary | ICD-10-CM

## 2018-12-22 DIAGNOSIS — K219 Gastro-esophageal reflux disease without esophagitis: Secondary | ICD-10-CM

## 2018-12-22 DIAGNOSIS — G40909 Epilepsy, unspecified, not intractable, without status epilepticus: Secondary | ICD-10-CM

## 2018-12-22 DIAGNOSIS — M069 Rheumatoid arthritis, unspecified: Secondary | ICD-10-CM

## 2018-12-22 DIAGNOSIS — E782 Mixed hyperlipidemia: Secondary | ICD-10-CM

## 2018-12-23 LAB — CBC WITH DIFFERENTIAL/PLATELET
Basophils Absolute: 0 10*3/uL (ref 0.0–0.2)
Basos: 1 %
EOS (ABSOLUTE): 0.1 10*3/uL (ref 0.0–0.4)
Eos: 2 %
Hematocrit: 39.1 % (ref 37.5–51.0)
Hemoglobin: 13.1 g/dL (ref 13.0–17.7)
Immature Grans (Abs): 0 10*3/uL (ref 0.0–0.1)
Immature Granulocytes: 0 %
Lymphocytes Absolute: 1.9 10*3/uL (ref 0.7–3.1)
Lymphs: 25 %
MCH: 34.2 pg — ABNORMAL HIGH (ref 26.6–33.0)
MCHC: 33.5 g/dL (ref 31.5–35.7)
MCV: 102 fL — ABNORMAL HIGH (ref 79–97)
Monocytes Absolute: 0.9 10*3/uL (ref 0.1–0.9)
Monocytes: 12 %
Neutrophils Absolute: 4.7 10*3/uL (ref 1.4–7.0)
Neutrophils: 60 %
Platelets: 266 10*3/uL (ref 150–450)
RBC: 3.83 x10E6/uL — ABNORMAL LOW (ref 4.14–5.80)
RDW: 13.2 % (ref 11.6–15.4)
WBC: 7.7 10*3/uL (ref 3.4–10.8)

## 2018-12-23 LAB — BMP8+EGFR
BUN/Creatinine Ratio: 13 (ref 10–24)
BUN: 14 mg/dL (ref 8–27)
CO2: 26 mmol/L (ref 20–29)
Calcium: 9.1 mg/dL (ref 8.6–10.2)
Chloride: 98 mmol/L (ref 96–106)
Creatinine, Ser: 1.12 mg/dL (ref 0.76–1.27)
GFR calc Af Amer: 75 mL/min/{1.73_m2} (ref >59)
GFR calc non Af Amer: 65 mL/min/{1.73_m2} (ref >59)
Glucose: 89 mg/dL (ref 65–99)
Potassium: 4.4 mmol/L (ref 3.5–5.2)
Sodium: 138 mmol/L (ref 134–144)

## 2018-12-23 LAB — PSA, TOTAL AND FREE
PSA, Free Pct: 80 %
PSA, Free: 0.16 ng/mL
Prostate Specific Ag, Serum: 0.2 ng/mL (ref 0.0–4.0)

## 2018-12-23 LAB — LIPID PANEL
Chol/HDL Ratio: 2.2 ratio (ref 0.0–5.0)
Cholesterol, Total: 124 mg/dL (ref 100–199)
HDL: 56 mg/dL (ref >39)
LDL Calculated: 51 mg/dL (ref 0–99)
Triglycerides: 85 mg/dL (ref 0–149)
VLDL Cholesterol Cal: 17 mg/dL (ref 5–40)

## 2018-12-23 LAB — THYROID PANEL WITH TSH
Free Thyroxine Index: 1.8 (ref 1.2–4.9)
T3 Uptake Ratio: 25 % (ref 24–39)
T4, Total: 7.1 ug/dL (ref 4.5–12.0)
TSH: 4.05 u[IU]/mL (ref 0.450–4.500)

## 2018-12-23 LAB — HEPATIC FUNCTION PANEL
ALT: 26 IU/L (ref 0–44)
AST: 37 IU/L (ref 0–40)
Albumin: 4.4 g/dL (ref 3.7–4.7)
Alkaline Phosphatase: 71 IU/L (ref 39–117)
Bilirubin Total: 0.2 mg/dL (ref 0.0–1.2)
Bilirubin, Direct: 0.06 mg/dL (ref 0.00–0.40)
Total Protein: 6.9 g/dL (ref 6.0–8.5)

## 2018-12-23 LAB — VITAMIN D 25 HYDROXY (VIT D DEFICIENCY, FRACTURES): Vit D, 25-Hydroxy: 59.9 ng/mL (ref 30.0–100.0)

## 2019-02-03 ENCOUNTER — Other Ambulatory Visit: Payer: Self-pay | Admitting: Family Medicine

## 2019-02-08 ENCOUNTER — Other Ambulatory Visit: Payer: Self-pay | Admitting: Family Medicine

## 2019-03-07 ENCOUNTER — Other Ambulatory Visit: Payer: Self-pay | Admitting: Family Medicine

## 2019-04-16 ENCOUNTER — Ambulatory Visit (INDEPENDENT_AMBULATORY_CARE_PROVIDER_SITE_OTHER): Payer: Medicare Other | Admitting: Family Medicine

## 2019-04-16 ENCOUNTER — Encounter: Payer: Self-pay | Admitting: Family Medicine

## 2019-04-16 DIAGNOSIS — E782 Mixed hyperlipidemia: Secondary | ICD-10-CM | POA: Diagnosis not present

## 2019-04-16 DIAGNOSIS — E039 Hypothyroidism, unspecified: Secondary | ICD-10-CM | POA: Diagnosis not present

## 2019-04-16 DIAGNOSIS — K219 Gastro-esophageal reflux disease without esophagitis: Secondary | ICD-10-CM

## 2019-04-16 MED ORDER — EZETIMIBE 10 MG PO TABS: 5.0000 mg | ORAL_TABLET | Freq: Every day | ORAL | 3 refills | Status: DC

## 2019-04-16 MED ORDER — ESCITALOPRAM OXALATE 20 MG PO TABS: 20.0000 mg | ORAL_TABLET | Freq: Every day | ORAL | 3 refills | Status: DC

## 2019-04-16 MED ORDER — ATORVASTATIN CALCIUM 40 MG PO TABS: 40.0000 mg | ORAL_TABLET | Freq: Every day | ORAL | 3 refills | Status: DC

## 2019-04-16 MED ORDER — OMEPRAZOLE 20 MG PO CPDR: 20.0000 mg | DELAYED_RELEASE_CAPSULE | Freq: Every day | ORAL | 3 refills | Status: DC

## 2019-04-16 MED ORDER — LEVOTHYROXINE SODIUM 50 MCG PO TABS: 50.0000 ug | ORAL_TABLET | Freq: Every day | ORAL | 3 refills | Status: DC

## 2019-04-16 NOTE — Progress Notes (Signed)
Virtual Visit via telephone Note  I connected with Troy Rodgers. on 04/16/19 at 1010 by telephone and verified that I am speaking with the correct person using two identifiers. Troy P Lorena Clearman. is currently located at home and wife Troy Rodgers are currently with her during visit. The provider, Fransisca Kaufmann Analyce Tavares, MD is located in their office at time of visit.  Call ended at 1026  I discussed the limitations, risks, security and privacy concerns of performing an evaluation and management service by telephone and the availability of in person appointments. I also discussed with the patient that there may be a patient responsible charge related to this service. The patient expressed understanding and agreed to proceed.   History and Present Illness: Hypothyroidism recheck Patient is coming in for thyroid recheck today as well. They deny any issues with hair changes or heat or cold problems or diarrhea or constipation. They deny any chest pain or palpitations. They are currently on levothyroxine 37mcrograms   GERD Patient is currently on omeprazole.  She denies any major symptoms or abdominal pain or belching or burping. She denies any blood in her stool or lightheadedness or dizziness.   Hyperlipidemia Patient is coming in for recheck of his hyperlipidemia. The patient is currently taking fish oils and zetia and lipitor. They deny any issues with myalgias or history of liver damage from it. They deny any focal numbness or weakness or chest pain.   Anxiety recheck  Patient is currently taking lexapro and feels like it is doing well and denies suicidal ideations and wants to continue.  No diagnosis found.  Outpatient Encounter Medications as of 04/16/2019  Medication Sig  . aspirin 81 MG EC tablet Take 81 mg by mouth daily.    .Marland Kitchenatorvastatin (LIPITOR) 40 MG tablet TAKE 1 TABLET EVERY EVENING AS DIRECTED  . Cholecalciferol (VITAMIN D3) 2000 UNITS TABS Take 1 tablet twice Mon-Fri and 2 twice  a day on Sat and Sun  . escitalopram (LEXAPRO) 20 MG tablet TAKE 1 TABLET BY MOUTH EVERY DAY  . ezetimibe (ZETIA) 10 MG tablet TAKE ONE TABLET BY MOUTH DAILY FOR HYPERLIPIDERMEA  . folic acid (FOLVITE) 1 MG tablet Take 1 mg by mouth 2 (two) times daily.   .Marland KitchenguaiFENesin (MUCINEX) 600 MG 12 hr tablet Take 600 mg by mouth as needed.   . lacosamide (VIMPAT) 200 MG TABS tablet Take 200 mg by mouth 2 (two) times daily.  .Marland Kitchenlevothyroxine (SYNTHROID, LEVOTHROID) 50 MCG tablet TAKE 1 TABLET BY MOUTH EVERY DAY  . methotrexate (RHEUMATREX) 2.5 MG tablet Take 10 mg by mouth once a week.   . omega-3 fish oil (MAXEPA) 1000 MG CAPS capsule Take 2 capsules by mouth daily.  .Marland Kitchenomeprazole (PRILOSEC) 20 MG capsule TAKE 1 CAPSULE BY MOUTH EVERY DAY  . primidone (MYSOLINE) 250 MG tablet 250 mg. 1 in the am and 1 at bedtime   No facility-administered encounter medications on file as of 04/16/2019.     Review of Systems  Constitutional: Negative for chills and fever.  Respiratory: Negative for shortness of breath and wheezing.   Cardiovascular: Negative for chest pain and leg swelling.  Musculoskeletal: Negative for back pain and gait problem.  Skin: Negative for rash.  Neurological: Negative for dizziness, weakness, light-headedness and numbness.  Psychiatric/Behavioral: Negative for decreased concentration, dysphoric mood, self-injury, sleep disturbance and suicidal ideas. The patient is not nervous/anxious.   All other systems reviewed and are negative.   Observations/Objective: Patient sounds comfortable and  in no acute distress  Assessment and Plan: Problem List Items Addressed This Visit      Digestive   GERD (gastroesophageal reflux disease)   Relevant Medications   omeprazole (PRILOSEC) 20 MG capsule   Other Relevant Orders   CMP14+EGFR     Endocrine   Hypothyroidism - Primary   Relevant Medications   levothyroxine (SYNTHROID) 50 MCG tablet   Other Relevant Orders   TSH   TSH     Other    Hyperlipidemia   Relevant Medications   ezetimibe (ZETIA) 10 MG tablet   atorvastatin (LIPITOR) 40 MG tablet       Follow Up Instructions:  Follow up in 4 months Continue current medication for thyroid and cholesterol.  New medication for anxiety as well.  He sees rheumatology for his arthritis and neurology for his seizures.   I discussed the assessment and treatment plan with the patient. The patient was provided an opportunity to ask questions and all were answered. The patient agreed with the plan and demonstrated an understanding of the instructions.   The patient was advised to call back or seek an in-person evaluation if the symptoms worsen or if the condition fails to improve as anticipated.  The above assessment and management plan was discussed with the patient. The patient verbalized understanding of and has agreed to the management plan. Patient is aware to call the clinic if symptoms persist or worsen. Patient is aware when to return to the clinic for a follow-up visit. Patient educated on when it is appropriate to go to the emergency department.    I provided 16 minutes of non-face-to-face time during this encounter.    Worthy Rancher, MD

## 2019-04-20 ENCOUNTER — Encounter: Payer: Self-pay | Admitting: Internal Medicine

## 2019-04-26 ENCOUNTER — Other Ambulatory Visit: Payer: Self-pay

## 2019-04-27 ENCOUNTER — Other Ambulatory Visit (INDEPENDENT_AMBULATORY_CARE_PROVIDER_SITE_OTHER): Payer: Medicare Other

## 2019-04-27 ENCOUNTER — Ambulatory Visit: Payer: Medicare Other

## 2019-04-27 DIAGNOSIS — Z23 Encounter for immunization: Secondary | ICD-10-CM

## 2019-04-27 DIAGNOSIS — K219 Gastro-esophageal reflux disease without esophagitis: Secondary | ICD-10-CM

## 2019-04-27 DIAGNOSIS — E039 Hypothyroidism, unspecified: Secondary | ICD-10-CM

## 2019-04-28 LAB — CBC WITH DIFFERENTIAL/PLATELET
Basophils Absolute: 0.1 10*3/uL (ref 0.0–0.2)
Basos: 1 %
EOS (ABSOLUTE): 0.1 10*3/uL (ref 0.0–0.4)
Eos: 2 %
Hematocrit: 38.9 % (ref 37.5–51.0)
Hemoglobin: 13 g/dL (ref 13.0–17.7)
Immature Grans (Abs): 0 10*3/uL (ref 0.0–0.1)
Immature Granulocytes: 0 %
Lymphocytes Absolute: 1.8 10*3/uL (ref 0.7–3.1)
Lymphs: 24 %
MCH: 33.9 pg — ABNORMAL HIGH (ref 26.6–33.0)
MCHC: 33.4 g/dL (ref 31.5–35.7)
MCV: 102 fL — ABNORMAL HIGH (ref 79–97)
Monocytes Absolute: 1 10*3/uL — ABNORMAL HIGH (ref 0.1–0.9)
Monocytes: 13 %
Neutrophils Absolute: 4.3 10*3/uL (ref 1.4–7.0)
Neutrophils: 60 %
Platelets: 318 10*3/uL (ref 150–450)
RBC: 3.83 x10E6/uL — ABNORMAL LOW (ref 4.14–5.80)
RDW: 13.4 % (ref 11.6–15.4)
WBC: 7.2 10*3/uL (ref 3.4–10.8)

## 2019-04-28 LAB — CMP14+EGFR
ALT: 25 IU/L (ref 0–44)
AST: 31 IU/L (ref 0–40)
Albumin/Globulin Ratio: 1.5 (ref 1.2–2.2)
Albumin: 4.2 g/dL (ref 3.7–4.7)
Alkaline Phosphatase: 81 IU/L (ref 39–117)
BUN/Creatinine Ratio: 14 (ref 10–24)
BUN: 15 mg/dL (ref 8–27)
Bilirubin Total: 0.2 mg/dL (ref 0.0–1.2)
CO2: 27 mmol/L (ref 20–29)
Calcium: 9.3 mg/dL (ref 8.6–10.2)
Chloride: 99 mmol/L (ref 96–106)
Creatinine, Ser: 1.11 mg/dL (ref 0.76–1.27)
GFR calc Af Amer: 76 mL/min/{1.73_m2} (ref >59)
GFR calc non Af Amer: 66 mL/min/{1.73_m2} (ref >59)
Globulin, Total: 2.8 g/dL (ref 1.5–4.5)
Glucose: 87 mg/dL (ref 65–99)
Potassium: 4.6 mmol/L (ref 3.5–5.2)
Sodium: 139 mmol/L (ref 134–144)
Total Protein: 7 g/dL (ref 6.0–8.5)

## 2019-04-28 LAB — TSH: TSH: 3.88 u[IU]/mL (ref 0.450–4.500)

## 2019-05-19 ENCOUNTER — Ambulatory Visit (INDEPENDENT_AMBULATORY_CARE_PROVIDER_SITE_OTHER): Payer: Medicare Other | Admitting: *Deleted

## 2019-05-19 DIAGNOSIS — Z0000 Encounter for general adult medical examination without abnormal findings: Secondary | ICD-10-CM

## 2019-05-19 NOTE — Progress Notes (Signed)
MEDICARE ANNUAL WELLNESS VISIT  05/19/2019  Telephone Visit Disclaimer This Medicare AWV was conducted by telephone due to national recommendations for restrictions regarding the COVID-19 Pandemic (e.g. social distancing).  I verified, using two identifiers, that I am speaking with Troy Rodgers. or their authorized healthcare agent. I discussed the limitations, risks, security, and privacy concerns of performing an evaluation and management service by telephone and the potential availability of an in-person appointment in the future. The patient expressed understanding and agreed to proceed.   Subjective:  Troy Capell. is a 73 y.o. male patient of Dettinger, Fransisca Kaufmann, MD who had a Medicare Annual Wellness Visit today via telephone. Troy Rodgers is Retired and lives with his wife Troy Rodgers. They have been married for 50 years. They have 2 sons. He reports that he is socially active and does interact with friends/family regularly. He is minimally physically active and enjoys woodworking, watching tv, and going to church. Fall hazards were discussed with patient.   Patient Care Team: Dettinger, Fransisca Kaufmann, MD as PCP - General (Family Medicine) Katy Apo, MD as Consulting Physician (Ophthalmology) Phillips Odor, MD as Consulting Physician (Neurology) Minus Breeding, MD as Consulting Physician (Cardiology) Rogene Houston, MD as Consulting Physician (Gastroenterology) Hennie Duos, MD as Consulting Physician (Rheumatology) Tanda Rockers, MD as Consulting Physician (Pulmonary Disease)  Advanced Directives 05/19/2019 10/29/2017 10/28/2016 04/28/2013 04/28/2013 04/27/2013  Does Patient Have a Medical Advance Directive? Yes No No - Patient does not have advance directive;Patient would not like information Patient does not have advance directive;Patient would not like information  Type of Advance Directive [place]  Would patient like information on creating a  medical advance directive? No - Patient declined Yes (MAU/Ambulatory/Procedural Areas - Information given) - - - -  Pre-existing out of facility DNR order (yellow form or pink MOST form) - - - No No No    Hospital Utilization Over the Past 12 Months: # of hospitalizations or ER visits: 0 # of surgeries: 0  Review of Systems    Patient reports that his overall health is unchanged compared to last year.  History obtained from chart review, the patient and his wife Troy Rodgers  Patient Reported Readings (BP, Pulse, CBG, Weight, etc) none  Pain Assessment       Current Medications & Allergies (verified) Allergies as of 05/19/2019      Reactions   Fentanyl Other (See Comments)   In high doses of 100 mcg pt's O2 sats decrease to 60% RA   Hydrocodone    Intolerance       Medication List       Accurate as of May 19, 2019  1:29 PM. If you have any questions, ask your nurse or doctor.        aspirin 81 MG EC tablet Take 81 mg by mouth daily.   atorvastatin 40 MG tablet Commonly known as: LIPITOR Take 1 tablet (40 mg total) by mouth daily at 6 PM.   escitalopram 20 MG tablet Commonly known as: LEXAPRO Take 1 tablet (20 mg total) by mouth daily.   ezetimibe 10 MG tablet Commonly known as: ZETIA Take 0.5 tablets (5 mg total) by mouth daily.   folic acid 1 MG tablet Commonly known as: FOLVITE Take 1 mg by mouth 2 (two) times daily.   lacosamide 200 MG Tabs tablet Commonly known as: VIMPAT Take 200 mg by mouth 2 (two) times daily.  levothyroxine 50 MCG tablet Commonly known as: SYNTHROID Take 1 tablet (50 mcg total) by mouth daily.   methotrexate 2.5 MG tablet Commonly known as: RHEUMATREX Take 10 mg by mouth once a week.   Mucinex 600 MG 12 hr tablet Generic drug: guaiFENesin Take 600 mg by mouth as needed.   omega-3 fish oil 1000 MG Caps capsule Commonly known as: MAXEPA Take 2 capsules by mouth daily.   omeprazole 20 MG capsule Commonly known as: PRILOSEC  Take 1 capsule (20 mg total) by mouth daily.   primidone 250 MG tablet Commonly known as: MYSOLINE 250 mg. 1 in the am and 1 at bedtime   Vitamin D3 50 MCG (2000 UT) Tabs Take 1 tablet twice Mon-Fri and 2 twice a day on Sat and Sun       History (reviewed): Past Medical History:  Diagnosis Date  . Abdominal pain   . Arthritis    rheumatoid   . Asthma   . Bradycardia   . Cataract   . Chest pain   . Cholelithiases   . Depressive disorder, not elsewhere classified   . GERD (gastroesophageal reflux disease)   . Hearing deficit, bilateral   . HLD (hyperlipidemia)   . Hyperplasia of prostate   . Impotence of organic origin   . Kidney stone   . Localization-related (focal) (partial) epilepsy and epileptic syndromes with complex partial seizures, without mention of intractable epilepsy   . Nasal septal perforation   . Other and unspecified hyperlipidemia   . Seizures ([place])   . Thyroid disease    Past Surgical History:  Procedure Laterality Date  . CHOLECYSTECTOMY  04/28/2013   Procedure: LAPAROSCOPIC CHOLECYSTECTOMY;  Surgeon: Ralene Ok, MD;  Location: [place];  Service: General;;  . LEFT SHOULDER SURGERY  1996   Family History  Problem Relation Age of Onset  . Kidney failure Father        died age 54  . Kidney disease Father   . Heart disease Father        bypass surgery  . Colon cancer Mother   . Alzheimer's disease Mother   . Hyperlipidemia Mother   . Heart failure Sister        CHF  . Heart disease Sister   . Peripheral vascular disease Sister   . Rheum arthritis Sister   . Pancreatitis Sister   . Pneumonia Sister   . Hyperlipidemia Son   . Alzheimer's disease Maternal Grandmother   . Peripheral vascular disease Sister   . Anxiety disorder Son    Social History   Socioeconomic History  . Marital status: Married    Spouse name: Troy Rodgers   . Number of children: 2  . Years of education: 3  . Highest education level: Some college, no degree   Occupational History  . 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 Financial resource strain: Not hard at all  . Food insecurity    Worry: Never true    Inability: Never true  . Transportation needs    Medical: No    Non-medical: No  Tobacco Use  . Smoking status: Never Smoker  . Smokeless tobacco: Never Used  Substance and Sexual Activity  . Alcohol use: No  . Drug use: No  . Sexual activity: Not on file  Lifestyle  . Physical activity    Days per week: 7 days    Minutes per session: 30 min  .  Stress: Not at all  Relationships  . Social Herbalist on phone: Three times a week    Gets together: Once a week    Attends religious service: More than 4 times per year    Active member of club or organization: Yes    Attends meetings of clubs or organizations: More than 4 times per year    Relationship status: Married  Other Topics Concern  . Not on file  Social History Narrative   Lives with wife.      Activities of Daily Living In your present state of health, do you have any difficulty performing the following activities: 05/19/2019  Hearing? Y  Comment Wears hearing aids  Vision? N  Comment wears glasses  Difficulty concentrating or making decisions? N  Walking or climbing stairs? N  Dressing or bathing? N  Doing errands, shopping? Y  Comment patient does not Physiological scientist and eating ? N  Using the Toilet? N  In the past six months, have you accidently leaked urine? N  Do you have problems with loss of bowel control? N  Managing your Medications? N  Managing your Finances? N  Housekeeping or managing your Housekeeping? N  Some recent data might be hidden    Patient Education/ Literacy    Exercise Current Exercise Habits: Home exercise routine, Type of exercise: walking, Time (Minutes): 30, Frequency (Times/Week): 7, Weekly Exercise (Minutes/Week): 210, Intensity: Mild  Diet Patient  reports consuming 3 meals a day and 1 snack(s) a day Patient reports that his primary diet is: Low fat Patient reports that she does have regular access to food.   Depression Screen PHQ 2/9 Scores 05/19/2019 07/29/2018 06/01/2018 02/10/2018 10/29/2017 09/22/2017 05/07/2017  PHQ - 2 Score 0 0 0 0 0 1 0     Fall Risk Fall Risk  05/19/2019 07/29/2018 06/01/2018 02/10/2018 10/29/2017  Falls in the past year? 1 0 1 Yes Yes  Number falls in past yr: 1 - 1 1 1   Injury with Fall? 0 - 1 No No  Comment - - laceration  to back of head 05/24/18 - -  Risk for fall due to : Other (Comment) - - - -  Risk for fall due to: Comment due to seizures - - - -  Follow up Falls prevention discussed - - - -     Objective:  Troy Rodgers. seemed alert and oriented and he participated appropriately during our telephone visit.  Blood Pressure Weight BMI  BP Readings from Last 3 Encounters:  07/29/18 109/66  06/01/18 119/67  02/10/18 (!) 106/53   Wt Readings from Last 3 Encounters:  07/29/18 151 lb (68.5 kg)  06/01/18 154 lb (69.9 kg)  02/10/18 150 lb (68 kg)   BMI Readings from Last 1 Encounters:  07/29/18 25.52 kg/m    *Unable to obtain current vital signs, weight, and BMI due to telephone visit type  Hearing/Vision  . Adriaan did not seem to have difficulty with hearing/understanding during the telephone conversation . Reports that he has not had a formal eye exam by an eye care professional within the past year . Reports that he has not had a formal hearing evaluation within the past year *Unable to fully assess hearing and vision during telephone visit type  Cognitive Function: 6CIT Screen 05/19/2019  What Year? 0 points  What month? 0 points  What time? 0 points  Count back from 20 0 points  Months in reverse 0 points  Repeat phrase 2 points  Total Score 2   (Normal:0-7, Significant for Dysfunction: >8)  Normal Cognitive Function Screening: Yes   Immunization & Health Maintenance Record  Immunization History  Administered Date(s) Administered  . Fluad Quad(high Dose 65+) 04/27/2019  . Influenza Split 04/14/2013  . Influenza Whole 04/14/2010  . Influenza, High Dose Seasonal PF 05/07/2016, 05/07/2017, 04/27/2018  . Influenza,inj,Quad PF,6+ Mos 05/04/2014, 04/19/2015  . Pneumococcal Conjugate-13 08/23/2013  . Pneumococcal Polysaccharide-23 12/19/2010  . Td 05/15/2008  . Tdap 06/01/2018  . Zoster 05/15/2008    Health Maintenance  Topic Date Due  . COLONOSCOPY  05/10/2018  . TETANUS/TDAP  06/01/2028  . INFLUENZA VACCINE  Completed  . Hepatitis C Screening  Completed  . PNA vac Low Risk Adult  Completed       Assessment  This is a routine wellness examination for Troy Rodgers.Marland Kitchen  Health Maintenance: Due or Overdue Health Maintenance Due  Topic Date Due  . COLONOSCOPY  05/10/2018    Troy Rodgers. does not need a referral for Community Assistance: Care Management:   no Social Work:    no Prescription Assistance:  no Nutrition/Diabetes Education:  no   Plan:  Personalized Goals Goals Addressed            This Visit's Progress   . AWV       05/19/2019 AWV Goal: Fall Prevention  . Over the next year, patient will decrease their risk for falls by: o Using assistive devices, such as a cane or walker, as needed o Identifying fall risks within their home and correcting them by: - Removing throw rugs - Adding handrails to stairs or ramps - Removing clutter and keeping a clear pathway throughout the home - Increasing light, especially at night - Adding shower handles/bars - Raising toilet seat o Identifying potential personal risk factors for falls: - Medication side effects - Incontinence/urgency - Vestibular dysfunction - Hearing loss - Musculoskeletal disorders - Neurological disorders - Orthostatic hypotension        Personalized Health Maintenance & Screening Recommendations  Colorectal cancer screening- Patient will call and  schedule. He has put it off due to [place].  EYE EXAM- Patient will call and schedule yearly eye exam.  Lung Cancer Screening Recommended: no (Low Dose CT Chest recommended if Age 15-80 years, 30 pack-year currently smoking OR have quit w/in past 15 years) Hepatitis C Screening recommended: no HIV Screening recommended: no  Advanced Directives: Written information was not prepared per patient's request.  Referrals & Orders No orders of the defined types were placed in this encounter.   Follow-up Plan . Follow-up with Dettinger, Fransisca Kaufmann, MD as planned . Schedule eye exam  . Schedule follow up colonoscopy   I have personally reviewed and noted the following in the patient's chart:   . Medical and social history . Use of alcohol, tobacco or illicit drugs  . Current medications and supplements . Functional ability and status . Nutritional status . Physical activity . Advanced directives . List of other physicians . Hospitalizations, surgeries, and ER visits in previous 12 months . Vitals . Screenings to include cognitive, depression, and falls . Referrals and appointments  In addition, I have reviewed and discussed with Troy Rodgers. certain preventive protocols, quality metrics, and best practice recommendations. A written personalized care plan for preventive services as well as general preventive health recommendations is available and can be mailed to the patient at his request.  Gareth Rodgers  05/19/2019

## 2019-07-03 ENCOUNTER — Other Ambulatory Visit: Payer: Self-pay | Admitting: Family Medicine

## 2019-08-10 ENCOUNTER — Ambulatory Visit: Payer: Medicare Other

## 2019-08-19 ENCOUNTER — Ambulatory Visit: Payer: Medicare Other

## 2019-08-23 ENCOUNTER — Ambulatory Visit: Payer: Medicare PPO | Admitting: Family Medicine

## 2019-08-23 ENCOUNTER — Other Ambulatory Visit: Payer: Self-pay

## 2019-08-23 ENCOUNTER — Encounter: Payer: Self-pay | Admitting: Family Medicine

## 2019-08-23 ENCOUNTER — Telehealth: Payer: Self-pay | Admitting: Family Medicine

## 2019-08-23 VITALS — BP 124/60 | HR 66 | Temp 98.4°F | Ht 64.5 in | Wt 149.8 lb

## 2019-08-23 DIAGNOSIS — K219 Gastro-esophageal reflux disease without esophagitis: Secondary | ICD-10-CM

## 2019-08-23 DIAGNOSIS — Z1211 Encounter for screening for malignant neoplasm of colon: Secondary | ICD-10-CM

## 2019-08-23 DIAGNOSIS — E039 Hypothyroidism, unspecified: Secondary | ICD-10-CM

## 2019-08-23 DIAGNOSIS — E782 Mixed hyperlipidemia: Secondary | ICD-10-CM | POA: Diagnosis not present

## 2019-08-23 NOTE — Progress Notes (Signed)
BP 124/60   Pulse 66   Temp 98.4 F (36.9 C) (Temporal)   Ht 5' 4.5" (1.638 m)   Wt 149 lb 12.8 oz (67.9 kg)   SpO2 100%   BMI 25.32 kg/m    Subjective:   Patient ID: Troy Rodgers., male    DOB: 26-Dec-1945, 74 y.o.   MRN: [id number]  HPI: Troy Rodgers. is a 74 y.o. male presenting on 08/23/2019 for Hypothyroidism (3 month follow up)   HPI Hypothyroidism recheck Patient is coming in for thyroid recheck today as well. They deny any issues with hair changes or heat or cold problems or diarrhea or constipation. They deny any chest pain or palpitations. They are currently on levothyroxine 52micrograms   Hyperlipidemia Patient is coming in for recheck of his hyperlipidemia. The patient is currently taking Zetia and fish oil. They deny any issues with myalgias or history of liver damage from it. They deny any focal numbness or weakness or chest pain.   GERD Patient is currently on omeprazole.  She denies any major symptoms or abdominal pain or belching or burping. She denies any blood in her stool or lightheadedness or dizziness.   Relevant past medical, surgical, family and social history reviewed and updated as indicated. Interim medical history since our last visit reviewed. Allergies and medications reviewed and updated.  Review of Systems  Constitutional: Negative for chills and fever.  Respiratory: Negative for shortness of breath and wheezing.   Cardiovascular: Negative for chest pain and leg swelling.  Endocrine: Negative for cold intolerance and heat intolerance.  Musculoskeletal: Negative for back pain and gait problem.  Skin: Negative for rash.  Neurological: Negative for dizziness, weakness and numbness.  All other systems reviewed and are negative.   Per HPI unless specifically indicated above   Allergies as of 08/23/2019      Reactions   Fentanyl Other (See Comments)   In high doses of 100 mcg pt's O2 sats decrease to 60% RA   Hydrocodone    Intolerance        Medication List       Accurate as of August 23, 2019 11:06 AM. If you have any questions, ask your nurse or doctor.        aspirin 81 MG EC tablet Take 81 mg by mouth daily.   atorvastatin 40 MG tablet Commonly known as: LIPITOR Take 1 tablet (40 mg total) by mouth daily at 6 PM.   escitalopram 20 MG tablet Commonly known as: LEXAPRO Take 1 tablet (20 mg total) by mouth daily.   ezetimibe 10 MG tablet Commonly known as: ZETIA TAKE ONE TABLET BY MOUTH DAILY FOR HYPERLIPIDERMEA What changed: See the new instructions.   folic acid 1 MG tablet Commonly known as: FOLVITE Take 1 mg by mouth 2 (two) times daily.   lacosamide 200 MG Tabs tablet Commonly known as: VIMPAT Take 200 mg by mouth 2 (two) times daily.   levothyroxine 50 MCG tablet Commonly known as: SYNTHROID Take 1 tablet (50 mcg total) by mouth daily.   methotrexate 2.5 MG tablet Commonly known as: RHEUMATREX Take 10 mg by mouth once a week.   Mucinex 600 MG 12 hr tablet Generic drug: guaiFENesin Take 600 mg by mouth as needed.   omega-3 fish oil 1000 MG Caps capsule Commonly known as: MAXEPA Take 2 capsules by mouth daily.   omeprazole 20 MG capsule Commonly known as: PRILOSEC Take 1 capsule (20 mg total) by mouth daily.  primidone 250 MG tablet Commonly known as: MYSOLINE 250 mg. 1 in the am and 1 at bedtime   Vitamin D3 50 MCG (2000 UT) Tabs Take 1 tablet twice Mon-Fri and 2 twice a day on Sat and Sun        Objective:   BP 124/60   Pulse 66   Temp 98.4 F (36.9 C) (Temporal)   Ht 5' 4.5" (1.638 m)   Wt 149 lb 12.8 oz (67.9 kg)   SpO2 100%   BMI 25.32 kg/m   Wt Readings from Last 3 Encounters:  08/23/19 149 lb 12.8 oz (67.9 kg)  07/29/18 151 lb (68.5 kg)  06/01/18 154 lb (69.9 kg)    Physical Exam Vitals and nursing note reviewed.  Constitutional:      General: He is not in acute distress.    Appearance: He is well-developed. He is not diaphoretic.  Eyes:     General:  No scleral icterus.    Conjunctiva/sclera: Conjunctivae normal.  Neck:     Thyroid: No thyromegaly.  Cardiovascular:     Rate and Rhythm: Normal rate and regular rhythm.     Heart sounds: Normal heart sounds. No murmur.  Pulmonary:     Effort: Pulmonary effort is normal. No respiratory distress.     Breath sounds: Normal breath sounds. No wheezing.  Musculoskeletal:        General: Normal range of motion.     Cervical back: Neck supple.  Lymphadenopathy:     Cervical: No cervical adenopathy.  Skin:    General: Skin is warm and dry.     Findings: No rash.  Neurological:     Mental Status: He is alert and oriented to person, place, and time.     Coordination: Coordination normal.  Psychiatric:        Behavior: Behavior normal.       Assessment & Plan:   Problem List Items Addressed This Visit      Digestive   GERD (gastroesophageal reflux disease)     Endocrine   Hypothyroidism - Primary   Relevant Orders   TSH     Other   Hyperlipidemia    Other Visit Diagnoses    Colon cancer screening       Relevant Orders   Ambulatory referral to Gastroenterology      Will recheck thyroid and patient has some labs from his rheumatologist as well in the system and they will look those up and have them done as well.  No change in medication today, will await lab results Follow up plan: No follow-ups on file.  Counseling provided for all of the vaccine components No orders of the defined types were placed in this encounter.   Caryl Pina, MD [facility] 08/23/2019, 11:06 AM

## 2019-08-23 NOTE — Telephone Encounter (Signed)
Pts wife called and requested to speak with Dr Neldon Mc nurse regarding patients appt today.

## 2019-08-23 NOTE — Telephone Encounter (Signed)
Returned wife's call.

## 2019-08-24 LAB — TSH: TSH: 2.53 u[IU]/mL (ref 0.450–4.500)

## 2019-08-27 ENCOUNTER — Ambulatory Visit: Payer: Medicare Other

## 2019-08-31 ENCOUNTER — Other Ambulatory Visit: Payer: Self-pay | Admitting: Family Medicine

## 2019-10-21 DIAGNOSIS — H903 Sensorineural hearing loss, bilateral: Secondary | ICD-10-CM | POA: Diagnosis not present

## 2019-11-04 DIAGNOSIS — H903 Sensorineural hearing loss, bilateral: Secondary | ICD-10-CM | POA: Diagnosis not present

## 2019-11-04 DIAGNOSIS — H524 Presbyopia: Secondary | ICD-10-CM | POA: Diagnosis not present

## 2019-11-04 DIAGNOSIS — H5203 Hypermetropia, bilateral: Secondary | ICD-10-CM | POA: Diagnosis not present

## 2019-11-04 DIAGNOSIS — H2513 Age-related nuclear cataract, bilateral: Secondary | ICD-10-CM | POA: Diagnosis not present

## 2019-12-06 DIAGNOSIS — R2689 Other abnormalities of gait and mobility: Secondary | ICD-10-CM | POA: Diagnosis not present

## 2019-12-06 DIAGNOSIS — G40219 Localization-related (focal) (partial) symptomatic epilepsy and epileptic syndromes with complex partial seizures, intractable, without status epilepticus: Secondary | ICD-10-CM | POA: Diagnosis not present

## 2019-12-06 DIAGNOSIS — Z79899 Other long term (current) drug therapy: Secondary | ICD-10-CM | POA: Diagnosis not present

## 2019-12-06 DIAGNOSIS — M130 Polyarthritis, unspecified: Secondary | ICD-10-CM | POA: Diagnosis not present

## 2020-01-26 DIAGNOSIS — M150 Primary generalized (osteo)arthritis: Secondary | ICD-10-CM | POA: Diagnosis not present

## 2020-01-26 DIAGNOSIS — Z79899 Other long term (current) drug therapy: Secondary | ICD-10-CM | POA: Diagnosis not present

## 2020-01-26 DIAGNOSIS — M0589 Other rheumatoid arthritis with rheumatoid factor of multiple sites: Secondary | ICD-10-CM | POA: Diagnosis not present

## 2020-01-26 DIAGNOSIS — Z6824 Body mass index (BMI) 24.0-24.9, adult: Secondary | ICD-10-CM | POA: Diagnosis not present

## 2020-02-17 ENCOUNTER — Other Ambulatory Visit: Payer: Self-pay | Admitting: Family Medicine

## 2020-02-21 ENCOUNTER — Encounter: Payer: Self-pay | Admitting: Family Medicine

## 2020-02-21 ENCOUNTER — Ambulatory Visit (INDEPENDENT_AMBULATORY_CARE_PROVIDER_SITE_OTHER): Payer: Medicare PPO | Admitting: Family Medicine

## 2020-02-21 ENCOUNTER — Other Ambulatory Visit: Payer: Self-pay

## 2020-02-21 VITALS — BP 115/58 | HR 66 | Temp 97.8°F | Ht 64.5 in | Wt 151.0 lb

## 2020-02-21 DIAGNOSIS — E782 Mixed hyperlipidemia: Secondary | ICD-10-CM

## 2020-02-21 DIAGNOSIS — E039 Hypothyroidism, unspecified: Secondary | ICD-10-CM | POA: Diagnosis not present

## 2020-02-21 DIAGNOSIS — E559 Vitamin D deficiency, unspecified: Secondary | ICD-10-CM | POA: Diagnosis not present

## 2020-02-21 DIAGNOSIS — K219 Gastro-esophageal reflux disease without esophagitis: Secondary | ICD-10-CM

## 2020-02-21 DIAGNOSIS — L03312 Cellulitis of back [any part except buttock]: Secondary | ICD-10-CM

## 2020-02-21 MED ORDER — CEPHALEXIN 500 MG PO CAPS: 500.0000 mg | ORAL_CAPSULE | Freq: Four times a day (QID) | ORAL | 0 refills | Status: DC

## 2020-02-21 NOTE — Progress Notes (Signed)
BP (!) 115/58   Pulse 66   Temp 97.8 F (36.6 C)   Ht 5' 4.5" (1.638 m)   Wt 151 lb (68.5 kg)   SpO2 97%   BMI 25.52 kg/m    Subjective:   Patient ID: Troy Rodgers., male    DOB: May 01, 1946, 74 y.o.   MRN: [id number]  HPI: Troy Rodgers. is a 74 y.o. male presenting on 02/21/2020 for Medical Management of Chronic Issues, Hypothyroidism, and Hyperlipidemia   HPI Hypothyroidism recheck Patient is coming in for thyroid recheck today as well. They deny any issues with hair changes or heat or cold problems or diarrhea or constipation. They deny any chest pain or palpitations. They are currently on levothyroxine 50 micrograms   Hyperlipidemia Patient is coming in for recheck of his hyperlipidemia. The patient is currently taking fish oil and Zetia and Lipitor. They deny any issues with myalgias or history of liver damage from it. They deny any focal numbness or weakness or chest pain.   Patient has a spot on his left mid back that is hard and red that his wife just noticed a couple days ago and he had not noticed before.  Chills or redness or warmth anywhere else.  She does say there is a hard spot and then a red spot but he does not know where it came from.  Patient is also coming in for vitamin D deficiency recheck.  GERD Patient is currently on omeprazole.  She denies any major symptoms or abdominal pain or belching or burping. She denies any blood in her stool or lightheadedness or dizziness.   Relevant past medical, surgical, family and social history reviewed and updated as indicated. Interim medical history since our last visit reviewed. Allergies and medications reviewed and updated.  Review of Systems  Constitutional: Negative for chills and fever.  Eyes: Negative for visual disturbance.  Respiratory: Negative for shortness of breath and wheezing.   Cardiovascular: Negative for chest pain and leg swelling.  Musculoskeletal: Negative for back pain and gait problem.   Skin: Positive for color change and rash.  Neurological: Negative for dizziness, weakness and numbness.  All other systems reviewed and are negative.   Per HPI unless specifically indicated above   Allergies as of 02/21/2020      Reactions   Fentanyl Other (See Comments)   In high doses of 100 mcg pt's O2 sats decrease to 60% RA   Hydrocodone    Intolerance       Medication List       Accurate as of February 21, 2020 11:28 AM. If you have any questions, ask your nurse or doctor.        STOP taking these medications   Vitamin D3 50 MCG (2000 UT) Tabs Stopped by: Fransisca Kaufmann Markham Dumlao, MD     TAKE these medications   aspirin 81 MG EC tablet Take 81 mg by mouth daily.   atorvastatin 40 MG tablet Commonly known as: LIPITOR TAKE 1 TABLET (40 MG TOTAL) BY MOUTH DAILY AT 6 PM.   escitalopram 20 MG tablet Commonly known as: LEXAPRO Take 1 tablet (20 mg total) by mouth daily.   ezetimibe 10 MG tablet Commonly known as: ZETIA TAKE ONE TABLET BY MOUTH DAILY FOR HYPERLIPIDERMEA   folic acid 1 MG tablet Commonly known as: FOLVITE Take 1 mg by mouth 2 (two) times daily.   lacosamide 200 MG Tabs tablet Commonly known as: VIMPAT Take 200 mg by mouth  2 (two) times daily.   levothyroxine 50 MCG tablet Commonly known as: SYNTHROID Take 1 tablet (50 mcg total) by mouth daily.   methotrexate 2.5 MG tablet Commonly known as: RHEUMATREX Take 10 mg by mouth once a week.   Mucinex 600 MG 12 hr tablet Generic drug: guaiFENesin Take 600 mg by mouth as needed.   omega-3 fish oil 1000 MG Caps capsule Commonly known as: MAXEPA Take 2 capsules by mouth daily.   omeprazole 20 MG capsule Commonly known as: PRILOSEC Take 1 capsule (20 mg total) by mouth daily.   primidone 250 MG tablet Commonly known as: MYSOLINE 250 mg. 1 in the am and 1 at bedtime        Objective:   BP (!) 115/58   Pulse 66   Temp 97.8 F (36.6 C)   Ht 5' 4.5" (1.638 m)   Wt 151 lb (68.5 kg)   SpO2  97%   BMI 25.52 kg/m   Wt Readings from Last 3 Encounters:  02/21/20 151 lb (68.5 kg)  08/23/19 149 lb 12.8 oz (67.9 kg)  07/29/18 151 lb (68.5 kg)    Physical Exam Vitals and nursing note reviewed.  Constitutional:      General: He is not in acute distress.    Appearance: He is well-developed. He is not diaphoretic.  Eyes:     General: No scleral icterus.    Conjunctiva/sclera: Conjunctivae normal.  Neck:     Thyroid: No thyromegaly.  Cardiovascular:     Rate and Rhythm: Normal rate and regular rhythm.     Heart sounds: Normal heart sounds. No murmur heard.   Pulmonary:     Effort: Pulmonary effort is normal. No respiratory distress.     Breath sounds: Normal breath sounds. No wheezing.  Musculoskeletal:        General: Normal range of motion.     Cervical back: Neck supple.  Lymphadenopathy:     Cervical: No cervical adenopathy.  Skin:    General: Skin is warm and dry.     Findings: Lesion (Area on left mid back of induration but no fluctuation and erythema, consistent with an infected cyst, cellulitis) present. No rash.  Neurological:     Mental Status: He is alert and oriented to person, place, and time.     Coordination: Coordination normal.  Psychiatric:        Behavior: Behavior normal.       Assessment & Plan:   Problem List Items Addressed This Visit      Digestive   GERD (gastroesophageal reflux disease)   Relevant Orders   CBC with Differential/Platelet (Completed)     Endocrine   Hypothyroidism - Primary   Relevant Orders   TSH (Completed)     Other   Hyperlipidemia   Relevant Orders   CMP14+EGFR (Completed)   Lipid panel (Completed)    Other Visit Diagnoses    Vitamin D deficiency       Relevant Orders   VITAMIN D 25 Hydroxy (Vit-D Deficiency, Fractures) (Completed)   Cellulitis of back except buttock       Relevant Medications   cephALEXin (KEFLEX) 500 MG capsule      Area of cellulitis has induration but no fluctuation, does not  feel like it would have enough to lance at this point, will gave antibiotic and recommended warm compresses. Follow up plan: Return in about 6 months (around 08/23/2020), or if symptoms worsen or fail to improve, for Thyroid and cholesterol recheck.  Counseling provided for all of the vaccine components Orders Placed This Encounter  Procedures  . CBC with Differential/Platelet  . CMP14+EGFR  . Lipid panel  . TSH  . VITAMIN D 25 Hydroxy (Vit-D Deficiency, Fractures)    Caryl Pina, MD [facility] 02/21/2020, 11:28 AM

## 2020-02-22 ENCOUNTER — Other Ambulatory Visit: Payer: Medicare PPO

## 2020-02-22 DIAGNOSIS — E039 Hypothyroidism, unspecified: Secondary | ICD-10-CM | POA: Diagnosis not present

## 2020-02-22 DIAGNOSIS — K219 Gastro-esophageal reflux disease without esophagitis: Secondary | ICD-10-CM

## 2020-02-22 DIAGNOSIS — E559 Vitamin D deficiency, unspecified: Secondary | ICD-10-CM | POA: Diagnosis not present

## 2020-02-22 DIAGNOSIS — E782 Mixed hyperlipidemia: Secondary | ICD-10-CM | POA: Diagnosis not present

## 2020-02-23 LAB — CMP14+EGFR
ALT: 32 IU/L (ref 0–44)
AST: 42 IU/L — ABNORMAL HIGH (ref 0–40)
Albumin/Globulin Ratio: 1.6 (ref 1.2–2.2)
Albumin: 4.1 g/dL (ref 3.7–4.7)
Alkaline Phosphatase: 85 IU/L (ref 48–121)
BUN/Creatinine Ratio: 13 (ref 10–24)
BUN: 15 mg/dL (ref 8–27)
Bilirubin Total: <0.2 mg/dL (ref 0.0–1.2)
CO2: 27 mmol/L (ref 20–29)
Calcium: 8.9 mg/dL (ref 8.6–10.2)
Chloride: 100 mmol/L (ref 96–106)
Creatinine, Ser: 1.12 mg/dL (ref 0.76–1.27)
GFR calc Af Amer: 74 mL/min/{1.73_m2} (ref >59)
GFR calc non Af Amer: 64 mL/min/{1.73_m2} (ref >59)
Globulin, Total: 2.6 g/dL (ref 1.5–4.5)
Glucose: 85 mg/dL (ref 65–99)
Potassium: 5.2 mmol/L (ref 3.5–5.2)
Sodium: 139 mmol/L (ref 134–144)
Total Protein: 6.7 g/dL (ref 6.0–8.5)

## 2020-02-23 LAB — CBC WITH DIFFERENTIAL/PLATELET
Basophils Absolute: 0.1 10*3/uL (ref 0.0–0.2)
Basos: 1 %
EOS (ABSOLUTE): 0.2 10*3/uL (ref 0.0–0.4)
Eos: 2 %
Hematocrit: 37.9 % (ref 37.5–51.0)
Hemoglobin: 12.8 g/dL — ABNORMAL LOW (ref 13.0–17.7)
Immature Grans (Abs): 0 10*3/uL (ref 0.0–0.1)
Immature Granulocytes: 0 %
Lymphocytes Absolute: 2.3 10*3/uL (ref 0.7–3.1)
Lymphs: 26 %
MCH: 34 pg — ABNORMAL HIGH (ref 26.6–33.0)
MCHC: 33.8 g/dL (ref 31.5–35.7)
MCV: 101 fL — ABNORMAL HIGH (ref 79–97)
Monocytes Absolute: 1.2 10*3/uL — ABNORMAL HIGH (ref 0.1–0.9)
Monocytes: 14 %
Neutrophils Absolute: 5.3 10*3/uL (ref 1.4–7.0)
Neutrophils: 57 %
Platelets: 306 10*3/uL (ref 150–450)
RBC: 3.76 x10E6/uL — ABNORMAL LOW (ref 4.14–5.80)
RDW: 13.2 % (ref 11.6–15.4)
WBC: 9.1 10*3/uL (ref 3.4–10.8)

## 2020-02-23 LAB — LIPID PANEL
Chol/HDL Ratio: 2.5 ratio (ref 0.0–5.0)
Cholesterol, Total: 140 mg/dL (ref 100–199)
HDL: 57 mg/dL (ref >39)
LDL Chol Calc (NIH): 65 mg/dL (ref 0–99)
Triglycerides: 99 mg/dL (ref 0–149)
VLDL Cholesterol Cal: 18 mg/dL (ref 5–40)

## 2020-02-23 LAB — VITAMIN D 25 HYDROXY (VIT D DEFICIENCY, FRACTURES): Vit D, 25-Hydroxy: 27.2 ng/mL — ABNORMAL LOW (ref 30.0–100.0)

## 2020-02-23 LAB — TSH: TSH: 4.48 u[IU]/mL (ref 0.450–4.500)

## 2020-03-16 ENCOUNTER — Telehealth: Payer: Self-pay | Admitting: Family Medicine

## 2020-03-17 NOTE — Telephone Encounter (Signed)
Check Troy Rodgers's schedule

## 2020-03-17 NOTE — Telephone Encounter (Signed)
WE have nothing until Tuesday! Unless u want to double book?

## 2020-03-17 NOTE — Telephone Encounter (Signed)
If it is still real red then he may have built up a pocket may need to have it drained, have him schedule an appointment ASAP so we can take a look at it.

## 2020-03-17 NOTE — Telephone Encounter (Signed)
appt made with Tiffany on 03/21/20 wife aware

## 2020-03-21 ENCOUNTER — Ambulatory Visit (INDEPENDENT_AMBULATORY_CARE_PROVIDER_SITE_OTHER): Payer: Medicare PPO | Admitting: Family Medicine

## 2020-03-21 ENCOUNTER — Other Ambulatory Visit: Payer: Self-pay

## 2020-03-21 ENCOUNTER — Encounter: Payer: Self-pay | Admitting: Family Medicine

## 2020-03-21 VITALS — BP 112/62 | HR 66 | Temp 97.9°F | Ht 64.5 in | Wt 152.1 lb

## 2020-03-21 DIAGNOSIS — L03312 Cellulitis of back [any part except buttock]: Secondary | ICD-10-CM | POA: Diagnosis not present

## 2020-03-21 MED ORDER — DOXYCYCLINE HYCLATE 100 MG PO TABS: 100.0000 mg | ORAL_TABLET | Freq: Two times a day (BID) | ORAL | 0 refills | Status: AC

## 2020-03-21 NOTE — Progress Notes (Signed)
Subjective: CC: infected cysts PCP: Dettinger, Fransisca Kaufmann, MD  PTW:[facility] P Lorenz Donley. is a 74 y.o. male presenting to clinic today for:  1. Cellulitis of back Patient is here with his wife. Wife states Isahi was seen about a month ago for an infected cyst on the left mid back. He was given Keflex and took prescription as directed. She reports that the area of concern has gotten a little smaller and is not as red, but is still there. She has been applying warm compresses 2-3x a day to the area and also applying neosporin. Denies drainage or worsening erythema. No fever, chills, warmth or tenderness.   Relevant past medical, surgical, family, and social history reviewed and updated as indicated.  Allergies and medications reviewed and updated.  Allergies  Allergen Reactions   Fentanyl Other (See Comments)    In high doses of 100 mcg pt's O2 sats decrease to 60% RA   Hydrocodone     Intolerance    Past Medical History:  Diagnosis Date   Abdominal pain    Arthritis    rheumatoid    Asthma    Bradycardia    Cataract    Chest pain    Cholelithiases    Depressive disorder, not elsewhere classified    GERD (gastroesophageal reflux disease)    Hearing deficit, bilateral    HLD (hyperlipidemia)    Hyperplasia of prostate    Impotence of organic origin    Kidney stone    Localization-related (focal) (partial) epilepsy and epileptic syndromes with complex partial seizures, without mention of intractable epilepsy    Nasal septal perforation    Other and unspecified hyperlipidemia    Seizures (HCC)    Thyroid disease     Current Outpatient Medications:    aspirin 81 MG EC tablet, Take 81 mg by mouth daily.  , Disp: , Rfl:    atorvastatin (LIPITOR) 40 MG tablet, TAKE 1 TABLET (40 MG TOTAL) BY MOUTH DAILY AT 6 PM., Disp: 90 tablet, Rfl: 0   Cholecalciferol (VITAMIN D3) 10 MCG (400 UNIT) CAPS, Take 800 Units by mouth., Disp: , Rfl:    escitalopram (LEXAPRO) 20  MG tablet, Take 1 tablet (20 mg total) by mouth daily., Disp: 90 tablet, Rfl: 3   ezetimibe (ZETIA) 10 MG tablet, TAKE ONE TABLET BY MOUTH DAILY FOR HYPERLIPIDERMEA, Disp: 30 tablet, Rfl: 5   folic acid (FOLVITE) 1 MG tablet, Take 1 mg by mouth 2 (two) times daily. , Disp: , Rfl:    guaiFENesin (MUCINEX) 600 MG 12 hr tablet, Take 600 mg by mouth as needed. , Disp: , Rfl:    lacosamide (VIMPAT) 200 MG TABS tablet, Take 200 mg by mouth 2 (two) times daily., Disp: , Rfl:    levothyroxine (SYNTHROID) 50 MCG tablet, Take 1 tablet (50 mcg total) by mouth daily., Disp: 90 tablet, Rfl: 3   methotrexate (RHEUMATREX) 2.5 MG tablet, Take 10 mg by mouth once a week. , Disp: , Rfl:    omega-3 fish oil (MAXEPA) 1000 MG CAPS capsule, Take 2 capsules by mouth daily., Disp: , Rfl:    omeprazole (PRILOSEC) 20 MG capsule, Take 1 capsule (20 mg total) by mouth daily., Disp: 90 capsule, Rfl: 3   primidone (MYSOLINE) 250 MG tablet, 250 mg. 1 in the am and 1 at bedtime, Disp: , Rfl:    Review of Systems  Constitutional: Negative for chills and fever.  Musculoskeletal: Negative for back pain.  Skin: Positive for color change.  Objective: Office vital signs reviewed. BP 112/62    Pulse 66    Temp 97.9 F (36.6 C) (Oral)    Ht 5' 4.5" (1.638 m)    Wt 152 lb 2 oz (69 kg)    BMI 25.71 kg/m   Physical Examination:  Physical Exam Constitutional:      General: He is not in acute distress.    Appearance: Normal appearance. He is not ill-appearing or diaphoretic.  Cardiovascular:     Rate and Rhythm: Normal rate.     Heart sounds: Normal heart sounds.  Pulmonary:     Effort: Pulmonary effort is normal. No respiratory distress.     Breath sounds: Normal breath sounds.  Skin:    General: Skin is dry.     Findings: Erythema present.       Neurological:     Mental Status: He is alert and oriented to person, place, and time.  Psychiatric:        Behavior: Behavior normal.      Results for orders  placed or performed in visit on 02/22/20  VITAMIN D 25 Hydroxy (Vit-D Deficiency, Fractures)  Result Value Ref Range   Vit D, 25-Hydroxy 27.2 (L) 30.0 - 100.0 ng/mL  TSH  Result Value Ref Range   TSH 4.480 0.450 - 4.500 uIU/mL  Lipid panel  Result Value Ref Range   Cholesterol, Total 140 100 - 199 mg/dL   Triglycerides 99 0 - 149 mg/dL   HDL 57 >39 mg/dL   VLDL Cholesterol Cal 18 5 - 40 mg/dL   LDL Chol Calc (NIH) 65 0 - 99 mg/dL   Chol/HDL Ratio 2.5 0.0 - 5.0 ratio  CMP14+EGFR  Result Value Ref Range   Glucose 85 65 - 99 mg/dL   BUN 15 8 - 27 mg/dL   Creatinine, Ser 1.12 0.76 - 1.27 mg/dL   GFR calc non Af Amer 64 >59 mL/min/1.73   GFR calc Af Amer 74 >59 mL/min/1.73   BUN/Creatinine Ratio 13 10 - 24   Sodium 139 134 - 144 mmol/L   Potassium 5.2 3.5 - 5.2 mmol/L   Chloride 100 96 - 106 mmol/L   CO2 27 20 - 29 mmol/L   Calcium 8.9 8.6 - 10.2 mg/dL   Total Protein 6.7 6.0 - 8.5 g/dL   Albumin 4.1 3.7 - 4.7 g/dL   Globulin, Total 2.6 1.5 - 4.5 g/dL   Albumin/Globulin Ratio 1.6 1.2 - 2.2   Bilirubin Total <0.2 0.0 - 1.2 mg/dL   Alkaline Phosphatase 85 48 - 121 IU/L   AST 42 (H) 0 - 40 IU/L   ALT 32 0 - 44 IU/L  CBC with Differential/Platelet  Result Value Ref Range   WBC 9.1 3.4 - 10.8 x10E3/uL   RBC 3.76 (L) 4.14 - 5.80 x10E6/uL   Hemoglobin 12.8 (L) 13.0 - 17.7 g/dL   Hematocrit 37.9 37.5 - 51.0 %   MCV 101 (H) 79 - 97 fL   MCH 34.0 (H) 26.6 - 33.0 pg   MCHC 33.8 31 - 35 g/dL   RDW 13.2 11.6 - 15.4 %   Platelets 306 150 - 450 x10E3/uL   Neutrophils 57 Not Estab. %   Lymphs 26 Not Estab. %   Monocytes 14 Not Estab. %   Eos 2 Not Estab. %   Basos 1 Not Estab. %   Neutrophils Absolute 5.3 1 - 7 x10E3/uL   Lymphocytes Absolute 2.3 0 - 3 x10E3/uL   Monocytes Absolute 1.2 (H) 0 -  0 x10E3/uL   EOS (ABSOLUTE) 0.2 0.0 - 0.4 x10E3/uL   Basophils Absolute 0.1 0 - 0 x10E3/uL   Immature Granulocytes 0 Not Estab. %   Immature Grans (Abs) 0.0 0.0 - 0.1 x10E3/uL      Assessment/ Plan: 74 y.o. male   Diagnoses and all orders for this visit:  Cellulitis of back except buttock  No area of fluctuation today to drain. Given erythema, induration, and previous treatment with keflex, will start on doxycyline. No symptoms of systemic illness at this time. Continue warm compresses and neosporin. Discussed symptoms of systemic illness with patient and his wife.  -     doxycycline (VIBRA-TABS) 100 MG tablet; Take 1 tablet (100 mg total) by mouth 2 (two) times daily for 10 days.   The above assessment and management plan was discussed with the patient. The patient verbalized understanding of and has agreed to the management plan. Patient is aware to call the clinic if symptoms persist or worsen. [place], FNP-C [facility] [address] [place], [place] [address] [phone]

## 2020-03-21 NOTE — Patient Instructions (Signed)
Area is currently hard and without an area to lance and drain.   Continue with warm compresses 2-3x a day. Continue with topical antibiotic ointment. Take antibiotic 2x a day for 10 days. Return for fever, chills, spreading redness, or worsening symptoms.    Cellulitis, Adult  Cellulitis is a skin infection. The infected area is usually warm, red, swollen, and tender. This condition occurs most often in the arms and lower legs. The infection can travel to the muscles, blood, and underlying tissue and become serious. It is very important to get treated for this condition. What are the causes? Cellulitis is caused by bacteria. The bacteria enter through a break in the skin, such as a cut, burn, insect bite, open sore, or crack. What increases the risk? This condition is more likely to occur in people who:  Have a weak body defense system (immune system).  Have open wounds on the skin, such as cuts, burns, bites, and scrapes. Bacteria can enter the body through these open wounds.  Are older than 74 years of age.  Have diabetes.  Have a type of long-lasting (chronic) liver disease (cirrhosis) or kidney disease.  Are obese.  Have a skin condition such as: ? Itchy rash (eczema). ? Slow movement of blood in the veins (venous stasis). ? Fluid buildup below the skin (edema).  Have had radiation therapy.  Use IV drugs. What are the signs or symptoms? Symptoms of this condition include:  Redness, streaking, or spotting on the skin.  Swollen area of the skin.  Tenderness or pain when an area of the skin is touched.  Warm skin.  A fever.  Chills.  Blisters. How is this diagnosed? This condition is diagnosed based on a medical history and physical exam. You may also have tests, including:  Blood tests.  Imaging tests. How is this treated? Treatment for this condition may include:  Medicines, such as antibiotic medicines or medicines to treat allergies  (antihistamines).  Supportive care, such as rest and application of cold or warm cloths (compresses) to the skin.  Hospital care, if the condition is severe. The infection usually starts to get better within 1-2 days of treatment. Follow these instructions at home:  Medicines  Take over-the-counter and prescription medicines only as told by your health care provider.  If you were prescribed an antibiotic medicine, take it as told by your health care provider. Do not stop taking the antibiotic even if you start to feel better. General instructions  Drink enough fluid to keep your urine pale yellow.  Do not touch or rub the infected area.  Raise (elevate) the infected area above the level of your heart while you are sitting or lying down.  Apply warm or cold compresses to the affected area as told by your health care provider.  Keep all follow-up visits as told by your health care provider. This is important. These visits let your health care provider make sure a more serious infection is not developing. Contact a health care provider if:  You have a fever.  Your symptoms do not begin to improve within 1-2 days of starting treatment.  Your bone or joint underneath the infected area becomes painful after the skin has healed.  Your infection returns in the same area or another area.  You notice a swollen bump in the infected area.  You develop new symptoms.  You have a general ill feeling (malaise) with muscle aches and pains. Get help right away if:  Your symptoms  get worse.  You feel very sleepy.  You develop vomiting or diarrhea that persists.  You notice red streaks coming from the infected area.  Your red area gets larger or turns dark in color. These symptoms may represent a serious problem that is an emergency. Do not wait to see if the symptoms will go away. Get medical help right away. Call your local emergency services (911 in the [place] Do not drive yourself  to the hospital. Summary  Cellulitis is a skin infection. This condition occurs most often in the arms and lower legs.  Treatment for this condition may include medicines, such as antibiotic medicines or antihistamines.  Take over-the-counter and prescription medicines only as told by your health care provider. If you were prescribed an antibiotic medicine, do not stop taking the antibiotic even if you start to feel better.  Contact a health care provider if your symptoms do not begin to improve within 1-2 days of starting treatment or your symptoms get worse.  Keep all follow-up visits as told by your health care provider. This is important. These visits let your health care provider make sure that a more serious infection is not developing. This information is not intended to replace advice given to you by your health care provider. Make sure you discuss any questions you have with your health care provider. Document Revised: 11/20/2017 Document Reviewed: 11/20/2017 Elsevier Patient Education  [employer].

## 2020-03-28 ENCOUNTER — Ambulatory Visit: Payer: Medicare PPO | Admitting: Nurse Practitioner

## 2020-04-23 ENCOUNTER — Other Ambulatory Visit: Payer: Self-pay | Admitting: Family Medicine

## 2020-04-27 DIAGNOSIS — Z79899 Other long term (current) drug therapy: Secondary | ICD-10-CM | POA: Diagnosis not present

## 2020-04-27 DIAGNOSIS — M0589 Other rheumatoid arthritis with rheumatoid factor of multiple sites: Secondary | ICD-10-CM | POA: Diagnosis not present

## 2020-05-03 ENCOUNTER — Ambulatory Visit: Payer: Medicare PPO

## 2020-05-09 ENCOUNTER — Other Ambulatory Visit: Payer: Self-pay

## 2020-05-09 ENCOUNTER — Ambulatory Visit (INDEPENDENT_AMBULATORY_CARE_PROVIDER_SITE_OTHER): Payer: Medicare PPO

## 2020-05-09 DIAGNOSIS — Z23 Encounter for immunization: Secondary | ICD-10-CM

## 2020-05-14 ENCOUNTER — Other Ambulatory Visit: Payer: Self-pay | Admitting: Family Medicine

## 2020-05-22 ENCOUNTER — Other Ambulatory Visit: Payer: Self-pay | Admitting: Family Medicine

## 2020-05-31 DIAGNOSIS — G40219 Localization-related (focal) (partial) symptomatic epilepsy and epileptic syndromes with complex partial seizures, intractable, without status epilepticus: Secondary | ICD-10-CM | POA: Diagnosis not present

## 2020-05-31 DIAGNOSIS — M130 Polyarthritis, unspecified: Secondary | ICD-10-CM | POA: Diagnosis not present

## 2020-05-31 DIAGNOSIS — R2689 Other abnormalities of gait and mobility: Secondary | ICD-10-CM | POA: Diagnosis not present

## 2020-05-31 DIAGNOSIS — Z79899 Other long term (current) drug therapy: Secondary | ICD-10-CM | POA: Diagnosis not present

## 2020-07-14 ENCOUNTER — Other Ambulatory Visit: Payer: Self-pay | Admitting: Family Medicine

## 2020-07-27 DIAGNOSIS — M150 Primary generalized (osteo)arthritis: Secondary | ICD-10-CM | POA: Diagnosis not present

## 2020-07-27 DIAGNOSIS — E663 Overweight: Secondary | ICD-10-CM | POA: Diagnosis not present

## 2020-07-27 DIAGNOSIS — Z79899 Other long term (current) drug therapy: Secondary | ICD-10-CM | POA: Diagnosis not present

## 2020-07-27 DIAGNOSIS — Z6826 Body mass index (BMI) 26.0-26.9, adult: Secondary | ICD-10-CM | POA: Diagnosis not present

## 2020-07-27 DIAGNOSIS — M0589 Other rheumatoid arthritis with rheumatoid factor of multiple sites: Secondary | ICD-10-CM | POA: Diagnosis not present

## 2020-08-07 ENCOUNTER — Other Ambulatory Visit: Payer: Self-pay | Admitting: Family Medicine

## 2020-08-23 ENCOUNTER — Encounter: Payer: Self-pay | Admitting: Family Medicine

## 2020-08-23 ENCOUNTER — Other Ambulatory Visit: Payer: Self-pay

## 2020-08-23 ENCOUNTER — Ambulatory Visit: Payer: Medicare PPO | Admitting: Family Medicine

## 2020-08-23 VITALS — BP 123/62 | HR 66 | Ht 64.5 in | Wt 154.0 lb

## 2020-08-23 DIAGNOSIS — F339 Major depressive disorder, recurrent, unspecified: Secondary | ICD-10-CM | POA: Diagnosis not present

## 2020-08-23 DIAGNOSIS — E782 Mixed hyperlipidemia: Secondary | ICD-10-CM | POA: Diagnosis not present

## 2020-08-23 DIAGNOSIS — M069 Rheumatoid arthritis, unspecified: Secondary | ICD-10-CM | POA: Diagnosis not present

## 2020-08-23 DIAGNOSIS — E039 Hypothyroidism, unspecified: Secondary | ICD-10-CM

## 2020-08-23 MED ORDER — EZETIMIBE 10 MG PO TABS: ORAL_TABLET | ORAL | 3 refills | Status: DC

## 2020-08-23 MED ORDER — OMEPRAZOLE 20 MG PO CPDR: DELAYED_RELEASE_CAPSULE | ORAL | 3 refills | Status: DC

## 2020-08-23 MED ORDER — ATORVASTATIN CALCIUM 40 MG PO TABS: 40.0000 mg | ORAL_TABLET | Freq: Every day | ORAL | 3 refills | Status: DC

## 2020-08-23 NOTE — Progress Notes (Signed)
BP 123/62   Pulse 66   Ht 5' 4.5" (1.638 m)   Wt 154 lb (69.9 kg)   SpO2 100%   BMI 26.03 kg/m    Subjective:   Patient ID: Troy Rodgers., male    DOB: 10/04/1945, 75 y.o.   MRN: [id number]  HPI: Troy Rodgers. is a 75 y.o. male presenting on 08/23/2020 for Medical Management of Chronic Issues and Hypothyroidism   HPI Hypothyroidism Pt coming in for recheck on hypothyroidism. He is still taking his levothyroxine and denies any side effects. He denies fevers, chills, weight changes, paraesthesia, cold intolerance, chest pain, palpitations, dysphagia, swelling or SOB. He states that all his medications are working well for him and he has no issues.  Hyperlipidemia Pt also coming in for hyperlipidemia. He is currently taking ezetimibe, atorvastatin, and omega-3 fatty acids. He denies any chest pain, myalgias, N/V/D, or constipation.  Pt states that he is still seeing his rheumatologist and neurologist for management of RA and seizure disorder. He feels this is still going well and has no complaints. He denies any recent seizures.   Relevant past medical, surgical, family and social history reviewed and updated as indicated. Interim medical history since our last visit reviewed. Allergies and medications reviewed and updated.  Review of Systems  Constitutional: Negative for appetite change, chills, fatigue, fever and unexpected weight change.  HENT: Negative.   Eyes: Negative.   Respiratory: Negative.   Cardiovascular: Negative for chest pain, palpitations and leg swelling.  Gastrointestinal: Negative for abdominal pain, blood in stool, constipation, diarrhea, nausea and vomiting.  Endocrine: Negative for cold intolerance and polyuria.  Genitourinary: Negative.   Musculoskeletal: Negative for myalgias.  Neurological: Negative for dizziness, seizures, syncope, weakness, light-headedness, numbness and headaches.  Psychiatric/Behavioral: Negative.     Per HPI unless  specifically indicated above   Allergies as of 08/23/2020      Reactions   Fentanyl Other (See Comments)   In high doses of 100 mcg pt's O2 sats decrease to 60% RA   Hydrocodone    Intolerance       Medication List       Accurate as of August 23, 2020 11:21 AM. If you have any questions, ask your nurse or doctor.        aspirin 81 MG EC tablet Take 81 mg by mouth daily.   atorvastatin 40 MG tablet Commonly known as: LIPITOR Take 1 tablet (40 mg total) by mouth daily at 6 PM.   escitalopram 20 MG tablet Commonly known as: LEXAPRO TAKE 1 TABLET BY MOUTH EVERY DAY   ezetimibe 10 MG tablet Commonly known as: ZETIA TAKE ONE TABLET BY MOUTH DAILY FOR HYPERLIPIDERMEA   folic acid 1 MG tablet Commonly known as: FOLVITE Take 1 mg by mouth 2 (two) times daily.   lacosamide 200 MG Tabs tablet Commonly known as: VIMPAT Take 200 mg by mouth 2 (two) times daily.   levothyroxine 50 MCG tablet Commonly known as: SYNTHROID TAKE 1 TABLET BY MOUTH EVERY DAY   methotrexate 2.5 MG tablet Commonly known as: RHEUMATREX Take 10 mg by mouth once a week.   Mucinex 600 MG 12 hr tablet Generic drug: guaiFENesin Take 600 mg by mouth as needed.   omega-3 fish oil 1000 MG Caps capsule Commonly known as: MAXEPA Take 2 capsules by mouth daily.   omeprazole 20 MG capsule Commonly known as: PRILOSEC TAKE 1 CAPSULE BY MOUTH EVERY DAY What changed:   how much to  take  how to take this  when to take this  additional instructions Changed by: Worthy Rancher, MD   primidone 250 MG tablet Commonly known as: MYSOLINE 250 mg. 1 in the am and 1 at bedtime   Vitamin D3 10 MCG (400 UNIT) Caps Take 800 Units by mouth.        Objective:   BP 123/62   Pulse 66   Ht 5' 4.5" (1.638 m)   Wt 154 lb (69.9 kg)   SpO2 100%   BMI 26.03 kg/m   Wt Readings from Last 3 Encounters:  08/23/20 154 lb (69.9 kg)  03/21/20 152 lb 2 oz (69 kg)  02/21/20 151 lb (68.5 kg)    Physical  Exam Constitutional:      Appearance: Normal appearance.  Eyes:     Extraocular Movements: Extraocular movements intact.     Pupils: Pupils are equal, round, and reactive to light.  Cardiovascular:     Rate and Rhythm: Normal rate and regular rhythm.     Pulses: Normal pulses.     Heart sounds: Normal heart sounds. No murmur heard.   Pulmonary:     Effort: Pulmonary effort is normal.     Breath sounds: Normal breath sounds.  Abdominal:     General: Abdomen is flat.     Palpations: Abdomen is soft.     Tenderness: There is no abdominal tenderness.  Musculoskeletal:        General: No swelling.     Right lower leg: No edema.     Left lower leg: No edema.  Skin:    General: Skin is warm and dry.  Neurological:     General: No focal deficit present.     Mental Status: He is alert and oriented to person, place, and time.  Psychiatric:        Mood and Affect: Mood normal.        Behavior: Behavior normal.        Thought Content: Thought content normal.       Assessment & Plan:   Problem List Items Addressed This Visit      Endocrine   Hypothyroidism - Primary   Relevant Orders   TSH (Completed)   CBC with Differential/Platelet (Completed)     Musculoskeletal and Integument   Rheumatoid arthritis ([place])   Relevant Orders   Folate (Completed)     Other   Hyperlipidemia   Relevant Medications   atorvastatin (LIPITOR) 40 MG tablet   ezetimibe (ZETIA) 10 MG tablet   Other Relevant Orders   CMP14+EGFR (Completed)   Depression, recurrent ([place])       Follow up plan: Return in about 6 months (around 02/20/2021), or if symptoms worsen or fail to improve, for Hypothyroidism and hyperlipidemia recheck.  Pt will have blood work drawn and notified if there are any concerns or changes to be made.  Counseling provided for all of the vaccine components Orders Placed This Encounter  Procedures  . TSH  . CBC with Differential/Platelet  . CMP14+EGFR  . Folate   Silverio Decamp 08/23/2020  I was personally present for all components of the history, physical exam and/or medical decision making.  I agree with the documentation performed by the PA student and agree with assessment and plan above.  PA student was [employer]  Caryl Pina, MD [facility] 08/23/2020, 11:21 AM

## 2020-08-24 LAB — CMP14+EGFR
ALT: 23 IU/L (ref 0–44)
AST: 30 IU/L (ref 0–40)
Albumin/Globulin Ratio: 1.8 (ref 1.2–2.2)
Albumin: 4.3 g/dL (ref 3.7–4.7)
Alkaline Phosphatase: 85 IU/L (ref 44–121)
BUN/Creatinine Ratio: 10 (ref 10–24)
BUN: 12 mg/dL (ref 8–27)
Bilirubin Total: 0.3 mg/dL (ref 0.0–1.2)
CO2: 27 mmol/L (ref 20–29)
Calcium: 9.2 mg/dL (ref 8.6–10.2)
Chloride: 100 mmol/L (ref 96–106)
Creatinine, Ser: 1.15 mg/dL (ref 0.76–1.27)
GFR calc Af Amer: 72 mL/min/{1.73_m2} (ref >59)
GFR calc non Af Amer: 62 mL/min/{1.73_m2} (ref >59)
Globulin, Total: 2.4 g/dL (ref 1.5–4.5)
Glucose: 88 mg/dL (ref 65–99)
Potassium: 5.1 mmol/L (ref 3.5–5.2)
Sodium: 139 mmol/L (ref 134–144)
Total Protein: 6.7 g/dL (ref 6.0–8.5)

## 2020-08-24 LAB — CBC WITH DIFFERENTIAL/PLATELET
Basophils Absolute: 0.1 10*3/uL (ref 0.0–0.2)
Basos: 1 %
EOS (ABSOLUTE): 0.1 10*3/uL (ref 0.0–0.4)
Eos: 1 %
Hematocrit: 35.8 % — ABNORMAL LOW (ref 37.5–51.0)
Hemoglobin: 12.9 g/dL — ABNORMAL LOW (ref 13.0–17.7)
Immature Grans (Abs): 0 10*3/uL (ref 0.0–0.1)
Immature Granulocytes: 0 %
Lymphocytes Absolute: 2.4 10*3/uL (ref 0.7–3.1)
Lymphs: 31 %
MCH: 35.6 pg — ABNORMAL HIGH (ref 26.6–33.0)
MCHC: 36 g/dL — ABNORMAL HIGH (ref 31.5–35.7)
MCV: 99 fL — ABNORMAL HIGH (ref 79–97)
Monocytes Absolute: 0.9 10*3/uL (ref 0.1–0.9)
Monocytes: 12 %
Neutrophils Absolute: 4.2 10*3/uL (ref 1.4–7.0)
Neutrophils: 55 %
Platelets: 274 10*3/uL (ref 150–450)
RBC: 3.62 x10E6/uL — ABNORMAL LOW (ref 4.14–5.80)
RDW: 12.8 % (ref 11.6–15.4)
WBC: 7.7 10*3/uL (ref 3.4–10.8)

## 2020-08-24 LAB — TSH: TSH: 2.98 u[IU]/mL (ref 0.450–4.500)

## 2020-08-24 LAB — FOLATE: Folate: >20 ng/mL (ref >3.0)

## 2020-09-19 ENCOUNTER — Ambulatory Visit: Payer: Medicare PPO | Admitting: Family Medicine

## 2020-10-25 DIAGNOSIS — M0589 Other rheumatoid arthritis with rheumatoid factor of multiple sites: Secondary | ICD-10-CM | POA: Diagnosis not present

## 2020-11-08 DIAGNOSIS — H5203 Hypermetropia, bilateral: Secondary | ICD-10-CM | POA: Diagnosis not present

## 2020-11-08 DIAGNOSIS — H2513 Age-related nuclear cataract, bilateral: Secondary | ICD-10-CM | POA: Diagnosis not present

## 2020-11-15 DIAGNOSIS — G40219 Localization-related (focal) (partial) symptomatic epilepsy and epileptic syndromes with complex partial seizures, intractable, without status epilepticus: Secondary | ICD-10-CM | POA: Diagnosis not present

## 2020-11-15 DIAGNOSIS — Z79899 Other long term (current) drug therapy: Secondary | ICD-10-CM | POA: Diagnosis not present

## 2020-11-15 DIAGNOSIS — M130 Polyarthritis, unspecified: Secondary | ICD-10-CM | POA: Diagnosis not present

## 2020-11-15 DIAGNOSIS — R2689 Other abnormalities of gait and mobility: Secondary | ICD-10-CM | POA: Diagnosis not present

## 2021-02-01 DIAGNOSIS — Z79899 Other long term (current) drug therapy: Secondary | ICD-10-CM | POA: Diagnosis not present

## 2021-02-01 DIAGNOSIS — M0589 Other rheumatoid arthritis with rheumatoid factor of multiple sites: Secondary | ICD-10-CM | POA: Diagnosis not present

## 2021-02-19 ENCOUNTER — Encounter: Payer: Self-pay | Admitting: Family Medicine

## 2021-02-19 ENCOUNTER — Other Ambulatory Visit: Payer: Self-pay

## 2021-02-19 ENCOUNTER — Ambulatory Visit: Payer: Medicare PPO | Admitting: Family Medicine

## 2021-02-19 VITALS — BP 124/56 | HR 70 | Ht 64.5 in | Wt 148.0 lb

## 2021-02-19 DIAGNOSIS — F339 Major depressive disorder, recurrent, unspecified: Secondary | ICD-10-CM | POA: Diagnosis not present

## 2021-02-19 DIAGNOSIS — K219 Gastro-esophageal reflux disease without esophagitis: Secondary | ICD-10-CM

## 2021-02-19 DIAGNOSIS — E782 Mixed hyperlipidemia: Secondary | ICD-10-CM | POA: Diagnosis not present

## 2021-02-19 DIAGNOSIS — E039 Hypothyroidism, unspecified: Secondary | ICD-10-CM | POA: Diagnosis not present

## 2021-02-19 MED ORDER — ESCITALOPRAM OXALATE 20 MG PO TABS: 20.0000 mg | ORAL_TABLET | Freq: Every day | ORAL | 3 refills | Status: DC

## 2021-02-19 MED ORDER — LEVOTHYROXINE SODIUM 50 MCG PO TABS: 50.0000 ug | ORAL_TABLET | Freq: Every day | ORAL | 3 refills | Status: DC

## 2021-02-19 NOTE — Progress Notes (Signed)
BP (!) 124/56   Pulse 70   Ht 5' 4.5" (1.638 m)   Wt 148 lb (67.1 kg)   SpO2 96%   BMI 25.01 kg/m    Subjective:   Patient ID: Troy Genre., male    DOB: 01-07-1946, 75 y.o.   MRN: [id number]  HPI: Troy Gueye. is a 75 y.o. male presenting on 02/19/2021 for Medical Management of Chronic Issues and Hypothyroidism   HPI Hyperlipidemia Patient is coming in for recheck of his hyperlipidemia. The patient is currently taking Lipitor and Zetia and fish oil. They deny any issues with myalgias or history of liver damage from it. They deny any focal numbness or weakness or chest pain.   Hypothyroidism recheck Patient is coming in for thyroid recheck today as well. They deny any issues with hair changes or heat or cold problems or diarrhea or constipation. They deny any chest pain or palpitations. They are currently on levothyroxine 50 micrograms   GERD Patient is currently on omeprazole.  She denies any major symptoms or abdominal pain or belching or burping. She denies any blood in her stool or lightheadedness or dizziness.   Depression recheck Patient is currently taking Lexapro and feels like it is doing very well for him and is very satisfied with his mood and anxiety and feels like everything is controlled. Depression screen [facility] 2/9 02/19/2021 08/23/2020 03/21/2020 02/21/2020 08/23/2019  Decreased Interest 0 0 0 0 0  Down, Depressed, Hopeless 0 0 0 0 0  PHQ - 2 Score 0 0 0 0 0     Relevant past medical, surgical, family and social history reviewed and updated as indicated. Interim medical history since our last visit reviewed. Allergies and medications reviewed and updated.  Review of Systems  Constitutional:  Negative for chills and fever.  Respiratory:  Negative for shortness of breath and wheezing.   Cardiovascular:  Negative for chest pain and leg swelling.  Musculoskeletal:  Negative for back pain and gait problem.  Skin:  Negative for rash.  Neurological:  Negative for  dizziness, weakness and light-headedness.  All other systems reviewed and are negative.  Per HPI unless specifically indicated above   Allergies as of 02/19/2021       Reactions   Fentanyl Other (See Comments)   In high doses of 100 mcg pt's O2 sats decrease to 60% RA   Hydrocodone    Intolerance         Medication List        Accurate as of February 19, 2021 10:45 AM. If you have any questions, ask your nurse or doctor.          aspirin 81 MG EC tablet Take 81 mg by mouth daily.   atorvastatin 40 MG tablet Commonly known as: LIPITOR Take 1 tablet (40 mg total) by mouth daily at 6 PM.   escitalopram 20 MG tablet Commonly known as: LEXAPRO Take 1 tablet (20 mg total) by mouth daily.   ezetimibe 10 MG tablet Commonly known as: ZETIA TAKE ONE TABLET BY MOUTH DAILY FOR HYPERLIPIDERMEA   folic acid 1 MG tablet Commonly known as: FOLVITE Take 1 mg by mouth 2 (two) times daily.   guaiFENesin 600 MG 12 hr tablet Commonly known as: MUCINEX Take 600 mg by mouth as needed.   lacosamide 200 MG Tabs tablet Commonly known as: VIMPAT Take 200 mg by mouth 2 (two) times daily.   levothyroxine 50 MCG tablet Commonly known as: SYNTHROID Take  1 tablet (50 mcg total) by mouth daily.   methotrexate 2.5 MG tablet Commonly known as: RHEUMATREX Take 10 mg by mouth once a week.   omega-3 fish oil 1000 MG Caps capsule Commonly known as: MAXEPA Take 2 capsules by mouth daily.   omeprazole 20 MG capsule Commonly known as: PRILOSEC TAKE 1 CAPSULE BY MOUTH EVERY DAY   primidone 250 MG tablet Commonly known as: MYSOLINE 250 mg. 1 in the am and 1 at bedtime   Vitamin D3 10 MCG (400 UNIT) Caps Take 800 Units by mouth.         Objective:   BP (!) 124/56   Pulse 70   Ht 5' 4.5" (1.638 m)   Wt 148 lb (67.1 kg)   SpO2 96%   BMI 25.01 kg/m   Wt Readings from Last 3 Encounters:  02/19/21 148 lb (67.1 kg)  08/23/20 154 lb (69.9 kg)  03/21/20 152 lb 2 oz (69 kg)     Physical Exam Vitals and nursing note reviewed.  Constitutional:      General: He is not in acute distress.    Appearance: He is well-developed. He is not diaphoretic.  Eyes:     General: No scleral icterus.    Conjunctiva/sclera: Conjunctivae normal.  Neck:     Thyroid: No thyromegaly.  Cardiovascular:     Rate and Rhythm: Normal rate and regular rhythm.     Heart sounds: Normal heart sounds. No murmur heard. Pulmonary:     Effort: Pulmonary effort is normal. No respiratory distress.     Breath sounds: Normal breath sounds. No wheezing.  Musculoskeletal:        General: Normal range of motion.     Cervical back: Neck supple.  Lymphadenopathy:     Cervical: No cervical adenopathy.  Skin:    General: Skin is warm and dry.     Findings: No rash.  Neurological:     Mental Status: He is alert and oriented to person, place, and time.     Coordination: Coordination normal.  Psychiatric:        Behavior: Behavior normal.      Assessment & Plan:   Problem List Items Addressed This Visit       Digestive   GERD (gastroesophageal reflux disease)   Relevant Orders   CBC with Differential/Platelet     Endocrine   Hypothyroidism   Relevant Medications   levothyroxine (SYNTHROID) 50 MCG tablet   Other Relevant Orders   TSH     Other   Hyperlipidemia - Primary   Relevant Orders   Lipid panel   CMP14+EGFR   Depression, recurrent (Troy Rodgers)   Relevant Medications   escitalopram (LEXAPRO) 20 MG tablet    Continue current medication, will check blood work, no changes, everything looks good today. Follow up plan: Return in about 6 months (around 08/22/2021), or if symptoms worsen or fail to improve, for Physical exam.  Counseling provided for all of the vaccine components Orders Placed This Encounter  Procedures   CBC with Differential/Platelet   Lipid panel   CMP14+EGFR   TSH    Caryl Pina, MD [facility] 02/19/2021, 10:45 AM

## 2021-02-20 LAB — CMP14+EGFR
ALT: 37 IU/L (ref 0–44)
AST: 44 IU/L — ABNORMAL HIGH (ref 0–40)
Albumin/Globulin Ratio: 1.6 (ref 1.2–2.2)
Albumin: 4.2 g/dL (ref 3.7–4.7)
Alkaline Phosphatase: 85 IU/L (ref 44–121)
BUN/Creatinine Ratio: 13 (ref 10–24)
BUN: 15 mg/dL (ref 8–27)
Bilirubin Total: 0.3 mg/dL (ref 0.0–1.2)
CO2: 27 mmol/L (ref 20–29)
Calcium: 9.4 mg/dL (ref 8.6–10.2)
Chloride: 99 mmol/L (ref 96–106)
Creatinine, Ser: 1.18 mg/dL (ref 0.76–1.27)
Globulin, Total: 2.6 g/dL (ref 1.5–4.5)
Glucose: 87 mg/dL (ref 65–99)
Potassium: 5 mmol/L (ref 3.5–5.2)
Sodium: 139 mmol/L (ref 134–144)
Total Protein: 6.8 g/dL (ref 6.0–8.5)
eGFR: 64 mL/min/{1.73_m2} (ref >59)

## 2021-02-20 LAB — CBC WITH DIFFERENTIAL/PLATELET
Basophils Absolute: 0.1 10*3/uL (ref 0.0–0.2)
Basos: 1 %
EOS (ABSOLUTE): 0.1 10*3/uL (ref 0.0–0.4)
Eos: 1 %
Hematocrit: 38.1 % (ref 37.5–51.0)
Hemoglobin: 12.9 g/dL — ABNORMAL LOW (ref 13.0–17.7)
Immature Grans (Abs): 0 10*3/uL (ref 0.0–0.1)
Immature Granulocytes: 0 %
Lymphocytes Absolute: 2 10*3/uL (ref 0.7–3.1)
Lymphs: 23 %
MCH: 34.2 pg — ABNORMAL HIGH (ref 26.6–33.0)
MCHC: 33.9 g/dL (ref 31.5–35.7)
MCV: 101 fL — ABNORMAL HIGH (ref 79–97)
Monocytes Absolute: 1.1 10*3/uL — ABNORMAL HIGH (ref 0.1–0.9)
Monocytes: 13 %
Neutrophils Absolute: 5.1 10*3/uL (ref 1.4–7.0)
Neutrophils: 62 %
Platelets: 286 10*3/uL (ref 150–450)
RBC: 3.77 x10E6/uL — ABNORMAL LOW (ref 4.14–5.80)
RDW: 13.3 % (ref 11.6–15.4)
WBC: 8.4 10*3/uL (ref 3.4–10.8)

## 2021-02-20 LAB — LIPID PANEL
Chol/HDL Ratio: 2.5 ratio (ref 0.0–5.0)
Cholesterol, Total: 132 mg/dL (ref 100–199)
HDL: 52 mg/dL (ref >39)
LDL Chol Calc (NIH): 60 mg/dL (ref 0–99)
Triglycerides: 111 mg/dL (ref 0–149)
VLDL Cholesterol Cal: 20 mg/dL (ref 5–40)

## 2021-02-20 LAB — TSH: TSH: 3.51 u[IU]/mL (ref 0.450–4.500)

## 2021-02-26 ENCOUNTER — Ambulatory Visit (INDEPENDENT_AMBULATORY_CARE_PROVIDER_SITE_OTHER): Payer: Medicare PPO

## 2021-02-26 VITALS — Ht 65.0 in | Wt 148.0 lb

## 2021-02-26 DIAGNOSIS — Z800 Family history of malignant neoplasm of digestive organs: Secondary | ICD-10-CM | POA: Diagnosis not present

## 2021-02-26 DIAGNOSIS — Z1211 Encounter for screening for malignant neoplasm of colon: Secondary | ICD-10-CM | POA: Diagnosis not present

## 2021-02-26 DIAGNOSIS — G4710 Hypersomnia, unspecified: Secondary | ICD-10-CM | POA: Insufficient documentation

## 2021-02-26 DIAGNOSIS — Z86010 Personal history of colonic polyps: Secondary | ICD-10-CM

## 2021-02-26 DIAGNOSIS — M130 Polyarthritis, unspecified: Secondary | ICD-10-CM | POA: Insufficient documentation

## 2021-02-26 DIAGNOSIS — Z0000 Encounter for general adult medical examination without abnormal findings: Secondary | ICD-10-CM

## 2021-02-26 DIAGNOSIS — R260 Ataxic gait: Secondary | ICD-10-CM | POA: Insufficient documentation

## 2021-02-26 DIAGNOSIS — G40219 Localization-related (focal) (partial) symptomatic epilepsy and epileptic syndromes with complex partial seizures, intractable, without status epilepticus: Secondary | ICD-10-CM | POA: Insufficient documentation

## 2021-02-26 NOTE — Progress Notes (Signed)
Subjective:   Troy Homrich. is a 75 y.o. male who presents for Medicare Annual/Subsequent preventive examination.  Virtual Visit via Telephone Note  I connected with  Troy Rodgers. on 02/26/21 at 11:15 AM EDT by telephone and verified that I am speaking with the correct person using two identifiers.  Location: Patient: Home Provider: [facility] Persons participating in the virtual visit: patient/Nurse Health Advisor   I discussed the limitations, risks, security and privacy concerns of performing an evaluation and management service by telephone and the availability of in person appointments. The patient expressed understanding and agreed to proceed.  Interactive audio and video telecommunications were attempted between this nurse and patient, however failed, due to patient having technical difficulties OR patient did not have access to video capability.  We continued and completed visit with audio only.  Some vital signs may be absent or patient reported.   Jawann Urbani E Ramiro Pangilinan, Troy Rodgers   Review of Systems     Cardiac Risk Factors include: advanced age (>61mn, >>75women);dyslipidemia;male gender     Objective:    Today's Vitals   02/26/21 1103  Weight: 148 lb (67.1 kg)  Height: '5\' 5"'$  (1.651 m)   Body mass index is 24.63 kg/m.  Advanced Directives 02/26/2021 05/19/2019 10/29/2017 10/28/2016 04/28/2013 04/28/2013 04/27/2013  Does Patient Have a Medical Advance Directive? Yes Yes No No - Patient does not have advance directive;Patient would not like information Patient does not have advance directive;Patient would not like information  Type of AScientist, forensicPower of AElaineLiving will HPomfretin Chart? No - copy requested - - - - - -  Would patient like information on creating a medical advance directive? - No - Patient declined Yes (MAU/Ambulatory/Procedural Areas - Information given) - - - -   Pre-existing out of facility DNR order (yellow form or pink MOST form) - - - - No No No    Current Medications (verified) Outpatient Encounter Medications as of 02/26/2021  Medication Sig   aspirin 81 MG EC tablet Take 81 mg by mouth daily.     atorvastatin (LIPITOR) 40 MG tablet Take 1 tablet (40 mg total) by mouth daily at 6 PM.   Cholecalciferol (VITAMIN D3) 10 MCG (400 UNIT) CAPS Take 800 Units by mouth.   escitalopram (LEXAPRO) 20 MG tablet Take 1 tablet (20 mg total) by mouth daily.   ezetimibe (ZETIA) 10 MG tablet TAKE ONE TABLET BY MOUTH DAILY FOR HYPERLIPIDERMEA   folic acid (FOLVITE) 1 MG tablet Take 1 mg by mouth 2 (two) times daily.    guaiFENesin (MUCINEX) 600 MG 12 hr tablet Take 600 mg by mouth as needed.    lacosamide (VIMPAT) 200 MG TABS tablet Take 200 mg by mouth 2 (two) times daily.   levothyroxine (SYNTHROID) 50 MCG tablet Take 1 tablet (50 mcg total) by mouth daily.   methotrexate (RHEUMATREX) 2.5 MG tablet Take 10 mg by mouth once a week.    omega-3 fish oil (MAXEPA) 1000 MG CAPS capsule Take 2 capsules by mouth daily.   omeprazole (PRILOSEC) 20 MG capsule TAKE 1 CAPSULE BY MOUTH EVERY DAY   primidone (MYSOLINE) 250 MG tablet 250 mg. 1 in the am and 1 at bedtime   No facility-administered encounter medications on file as of 02/26/2021.    Allergies (verified) Fentanyl and Hydrocodone   History: Past Medical History:  Diagnosis Date   Abdominal pain  Arthritis    rheumatoid    Asthma    Bradycardia    Cataract    Chest pain    Cholelithiases    Depressive disorder, not elsewhere classified    GERD (gastroesophageal reflux disease)    Hearing deficit, bilateral    HLD (hyperlipidemia)    Hyperplasia of prostate    Impotence of organic origin    Kidney stone    Localization-related (focal) (partial) epilepsy and epileptic syndromes with complex partial seizures, without mention of intractable epilepsy    Nasal septal perforation    Other and  unspecified hyperlipidemia    Seizures ([place])    Thyroid disease    Past Surgical History:  Procedure Laterality Date   CHOLECYSTECTOMY  04/28/2013   Procedure: LAPAROSCOPIC CHOLECYSTECTOMY;  Surgeon: Ralene Ok, MD;  Location: [facility] OR;  Service: General;;   LEFT SHOULDER SURGERY  1996   Family History  Problem Relation Age of Onset   Kidney failure Father        died age 59   Kidney disease Father    Heart disease Father        bypass surgery   Colon cancer Mother    Alzheimer's disease Mother    Hyperlipidemia Mother    Heart failure Sister        CHF   Heart disease Sister    Peripheral vascular disease Sister    Rheum arthritis Sister    Pancreatitis Sister    Pneumonia Sister    Hyperlipidemia Son    Alzheimer's disease Maternal Grandmother    Peripheral vascular disease Sister    Anxiety disorder Son    Social History   Socioeconomic History   Marital status: Married    Spouse name: Troy Rodgers    Number of children: 2   Years of education: 12   Highest education level: Some college, no degree  Occupational History   Occupation: Retired    Fish farm manager: [employer]    Comment: age 65  Tobacco Use   Smoking status: Never   Smokeless tobacco: Never  Vaping Use   Vaping Use: Never used  Substance and Sexual Activity   Alcohol use: No   Drug use: No   Sexual activity: Not on file  Other Topics Concern   Not on file  Social History Narrative   Lives with wife.     Children live nearby, talk on the phone daily, but only get together for holidays   Patient hard of hearing - wears hearing aids - mild cognitive impairment   Social Determinants of Health   Financial Resource Strain: Low Risk    Difficulty of Paying Living Expenses: Not hard at all  Food Insecurity: No Food Insecurity   Worried About Charity fundraiser in the Last Year: Never true   [place] in the Last Year: Never true  Transportation Needs: No Transportation Needs   Lack  of Transportation (Medical): No   Lack of Transportation (Non-Medical): No  Physical Activity: Sufficiently Active   Days of Exercise per Week: 7 days   Minutes of Exercise per Session: 30 min  Stress: No Stress Concern Present   Feeling of Stress : Not at all  Social Connections: Socially Integrated   Frequency of Communication with Friends and Family: More than three times a week   Frequency of Social Gatherings with Friends and Family: Once a week   Attends Religious Services: More than 4 times per year   Active Member  of Clubs or Organizations: Yes   Attends Music therapist: More than 4 times per year   Marital Status: Married    Tobacco Counseling Counseling given: Not Answered   Clinical Intake:  Pre-visit preparation completed: Yes  Pain : No/denies pain     BMI - recorded: 24.63 Nutritional Status: BMI of 19-24  Normal Nutritional Risks: None Diabetes: No  How often do you need to have someone help you when you read instructions, pamphlets, or other written materials from your doctor or pharmacy?: 1 - Never  Diabetic? No  Interpreter Needed?: No  Information entered by :: Troy Halabi, Troy Rodgers   Activities of Daily Living In your present state of health, do you have any difficulty performing the following activities: 02/26/2021  Hearing? Y  Comment wears hearing aids  Vision? N  Difficulty concentrating or making decisions? Y  Walking or climbing stairs? N  Dressing or bathing? N  Doing errands, shopping? Y  Comment cannot drive - wife Restaurant manager, fast food and eating ? N  Using the Toilet? N  In the past six months, have you accidently leaked urine? Y  Comment mild  Do you have problems with loss of bowel control? N  Managing your Medications? N  Managing your Finances? N  Housekeeping or managing your Housekeeping? N  Some recent data might be hidden    Patient Care Team: Dettinger, Fransisca Kaufmann, MD as PCP - General (Family  Medicine) Katy Apo, MD as Consulting Physician (Ophthalmology) Phillips Odor, MD as Consulting Physician (Neurology) Minus Breeding, MD as Consulting Physician (Cardiology) Rogene Houston, MD as Consulting Physician (Gastroenterology) Hennie Duos, MD as Consulting Physician (Rheumatology) Tanda Rockers, MD as Consulting Physician (Pulmonary Disease)  Indicate any recent Medical Services you may have received from other than [facility] providers in the past year (date may be approximate).     Assessment:   This is a routine wellness examination for [place].  Hearing/Vision screen Hearing Screening - Comments:: C/o moderate hearing difficulties - wears hearing aids from [facility] in [place] - Comments:: Wears eyeglasses - up to date with annual eye exams with Dr Prudencio Burly  Dietary issues and exercise activities discussed: Current Exercise Habits: Home exercise routine, Type of exercise: walking;strength training/weights;stretching, Time (Minutes): 30, Frequency (Times/Week): 7, Weekly Exercise (Minutes/Week): 210, Exercise limited by: orthopedic condition(s)   Goals Addressed             This Visit's Progress    AWV   On track    05/19/2019 AWV Goal: Fall Prevention  Over the next year, patient will decrease their risk for falls by: Using assistive devices, such as a cane or walker, as needed Identifying fall risks within their home and correcting them by: Removing throw rugs Adding handrails to stairs or ramps Removing clutter and keeping a clear pathway throughout the home Increasing light, especially at night Adding shower handles/bars Raising toilet seat Identifying potential personal risk factors for falls: Medication side effects Incontinence/urgency Vestibular dysfunction Hearing loss Musculoskeletal disorders Neurological disorders Orthostatic hypotension       Increase physical activity   On track    Stay active        Depression  Screen PHQ 2/9 Scores 02/26/2021 02/19/2021 08/23/2020 03/21/2020 02/21/2020 08/23/2019 05/19/2019  PHQ - 2 Score 0 0 0 0 0 0 0    Fall Risk Fall Risk  02/26/2021 02/19/2021 08/23/2020 03/21/2020 02/21/2020  Falls in the past year? 1 0 0 1 0  Number falls in past yr: 0 - - 0 -  Injury with Fall? 1 - - 0 -  Comment - - - - -  Risk for fall due to : History of fall(s);Impaired balance/gait;Orthopedic patient;Impaired vision;Other (Comment) - - History of fall(s) -  Risk for fall due to: Comment seizures - - - -  Follow up Education provided;Falls prevention discussed - - Falls evaluation completed -    FALL RISK PREVENTION PERTAINING TO THE HOME:  Any stairs in or around the home? No  If so, are there any without handrails? No  Home free of loose throw rugs in walkways, pet beds, electrical cords, etc? Yes  Adequate lighting in your home to reduce risk of falls? Yes   ASSISTIVE DEVICES UTILIZED TO PREVENT FALLS:  Life alert? No  Use of a cane, walker or w/c? No  Grab bars in the bathroom? Yes  Shower chair or bench in shower? Yes  Elevated toilet seat or a handicapped toilet? Yes   TIMED UP AND GO:  Was the test performed? No .   Cognitive Function: MMSE - Mini Mental State Exam 10/29/2017  Orientation to time 5  Orientation to Place 5  Registration 3  Attention/ Calculation 5  Recall 1  Language- name 2 objects 2  Language- repeat 1  Language- follow 3 step command 3  Language- read & follow direction 1  Write a sentence 1  Copy design 1  Total score 28     6CIT Screen 02/26/2021 05/19/2019  What Year? 0 points 0 points  What month? 3 points 0 points  What time? 0 points 0 points  Count back from 20 0 points 0 points  Months in reverse 4 points 0 points  Repeat phrase 6 points 2 points  Total Score 13 2    Immunizations Immunization History  Administered Date(s) Administered   Fluad Quad(high Dose 65+) 04/27/2019, 05/09/2020   Influenza Split 04/14/2013   Influenza Whole  04/14/2010   Influenza, High Dose Seasonal PF 05/07/2016, 05/07/2017, 04/27/2018   Influenza,inj,Quad PF,6+ Mos 05/04/2014, 04/19/2015   Moderna Sars-Covid-2 Vaccination 08/20/2019, 09/18/2019, 05/22/2020, 11/23/2020   Pneumococcal Conjugate-13 08/23/2013   Pneumococcal Polysaccharide-23 12/19/2010   Td 05/15/2008   Tdap 06/01/2018   Zoster, Live 05/15/2008    TDAP status: Up to date  Flu Vaccine status: Up to date  Pneumococcal vaccine status: Up to date  Covid-19 vaccine status: Completed vaccines  Qualifies for Shingles Vaccine? Yes   Zostavax completed Yes   Shingrix Completed?: Yes  Screening Tests Health Maintenance  Topic Date Due   Zoster Vaccines- Shingrix (1 of 2) Never done   COLONOSCOPY (Pts 45-25yr Insurance coverage will need to be confirmed)  05/10/2018   INFLUENZA VACCINE  02/12/2021   COVID-19 Vaccine (5 - Booster for Moderna series) 03/26/2021   TETANUS/TDAP  06/01/2028   Hepatitis C Screening  Completed   PNA vac Low Risk Adult  Completed   HPV VACCINES  Aged Out    Health Maintenance  Health Maintenance Due  Topic Date Due   Zoster Vaccines- Shingrix (1 of 2) Never done   COLONOSCOPY (Pts 45-498yrInsurance coverage will need to be confirmed)  05/10/2018   INFLUENZA VACCINE  02/12/2021    Colorectal cancer screening: Referral to GI placed 02/26/21. Pt aware the office will call re: appt.  Lung Cancer Screening: (Low Dose CT Chest recommended if Age 75-80ears, 30 pack-year currently smoking OR have quit w/in 15years.) does not qualify.   Additional  Screening:  Hepatitis C Screening: does qualify; Completed 12/26/2016  Vision Screening: Recommended annual ophthalmology exams for early detection of glaucoma and other disorders of the eye. Is the patient up to date with their annual eye exam?  Yes  Who is the provider or what is the name of the office in which the patient attends annual eye exams? [facility] If pt is not established with a provider,  would they like to be referred to a provider to establish care? No .   Dental Screening: Recommended annual dental exams for proper oral hygiene  Community Resource Referral / Chronic Care Management: CRR required this visit?  No   CCM required this visit?  No      Plan:     I have personally reviewed and noted the following in the patient's chart:   Medical and social history Use of alcohol, tobacco or illicit drugs  Current medications and supplements including opioid prescriptions. Patient is not currently taking opioid prescriptions. Functional ability and status Nutritional status Physical activity Advanced directives List of other physicians Hospitalizations, surgeries, and ER visits in previous 12 months Vitals Screenings to include cognitive, depression, and falls Referrals and appointments  In addition, I have reviewed and discussed with patient certain preventive protocols, quality metrics, and best practice recommendations. A written personalized care plan for preventive services as well as general preventive health recommendations were provided to patient.     Troy Hammond, Troy Rodgers   [id number]   Nurse Notes: 6CIT score abnormal

## 2021-02-26 NOTE — Patient Instructions (Addendum)
Troy Rodgers , Thank you for taking time to come for your Medicare Wellness Visit. I appreciate your ongoing commitment to your health goals. Please review the following plan we discussed and let me know if I can assist you in the future.   Screening recommendations/referrals: Colonoscopy: Done 05/10/2014 - Repeat in 5 years (referral order placed today) Recommended yearly ophthalmology/optometry visit for glaucoma screening and checkup Recommended yearly dental visit for hygiene and checkup  Vaccinations: Influenza vaccine: Done 05/09/20 - Repeat annually Pneumococcal vaccine: Done 08/23/2013 & 06/30/2014 Tdap vaccine: Done 06/01/2018 - Repeat in 10 years Shingles vaccine: Zostavax done 05/15/2008; Shingrix discussed. Please contact your pharmacy for coverage information.     Covid-19: Done 08/20/19, 09/18/19, 05/22/20, & 11/23/2020  Advanced directives: Please bring a copy of your health care power of attorney and living will to the office to be added to your chart at your convenience.   Conditions/risks identified: Fall prevention: consider installing grab bars, shower chair, elevated toilet seat and consider using a cane or walker for added support  Next appointment: Follow up in one year for your annual wellness visit.   Preventive Care 75 Years and Older, Male  Preventive care refers to lifestyle choices and visits with your health care provider that can promote health and wellness. What does preventive care include? A yearly physical exam. This is also called an annual well check. Dental exams once or twice a year. Routine eye exams. Ask your health care provider how often you should have your eyes checked. Personal lifestyle choices, including: Daily care of your teeth and gums. Regular physical activity. Eating a healthy diet. Avoiding tobacco and drug use. Limiting alcohol use. Practicing safe sex. Taking low doses of aspirin every day. Taking vitamin and mineral supplements as  recommended by your health care provider. What happens during an annual well check? The services and screenings done by your health care provider during your annual well check will depend on your age, overall health, lifestyle risk factors, and family history of disease. Counseling  Your health care provider may ask you questions about your: Alcohol use. Tobacco use. Drug use. Emotional well-being. Home and relationship well-being. Sexual activity. Eating habits. History of falls. Memory and ability to understand (cognition). Work and work Statistician. Screening  You may have the following tests or measurements: Height, weight, and BMI. Blood pressure. Lipid and cholesterol levels. These may be checked every 5 years, or more frequently if you are over 54 years old. Skin check. Lung cancer screening. You may have this screening every year starting at age 75 if you have a 30-pack-year history of smoking and currently smoke or have quit within the past 75 years. Fecal occult blood test (FOBT) of the stool. You may have this test every year starting at age 75. Flexible sigmoidoscopy or colonoscopy. You may have a sigmoidoscopy every 5 years or a colonoscopy every 10 years starting at age 75. Prostate cancer screening. Recommendations will vary depending on your family history and other risks. Hepatitis C blood test. Hepatitis B blood test. Sexually transmitted disease (STD) testing. Diabetes screening. This is done by checking your blood sugar (glucose) after you have not eaten for a while (fasting). You may have this done every 1-3 years. Abdominal aortic aneurysm (AAA) screening. You may need this if you are a current or former smoker. Osteoporosis. You may be screened starting at age 75 if you are at high risk. Talk with your health care provider about your test results, treatment options,  and if necessary, the need for more tests. Vaccines  Your health care provider may recommend  certain vaccines, such as: Influenza vaccine. This is recommended every year. Tetanus, diphtheria, and acellular pertussis (Tdap, Td) vaccine. You may need a Td booster every 10 years. Zoster vaccine. You may need this after age 75. Pneumococcal 13-valent conjugate (PCV13) vaccine. One dose is recommended after age 75. Pneumococcal polysaccharide (PPSV23) vaccine. One dose is recommended after age 75. Talk to your health care provider about which screenings and vaccines you need and how often you need them. This information is not intended to replace advice given to you by your health care provider. Make sure you discuss any questions you have with your health care provider. Document Released: 07/28/2015 Document Revised: 03/20/2016 Document Reviewed: 05/02/2015 Elsevier Interactive Patient Education  2017 [place] Prevention in the Home Falls can cause injuries. They can happen to people of all ages. There are many things you can do to make your home safe and to help prevent falls. What can I do on the outside of my home? Regularly fix the edges of walkways and driveways and fix any cracks. Remove anything that might make you trip as you walk through a door, such as a raised step or threshold. Trim any bushes or trees on the path to your home. Use bright outdoor lighting. Clear any walking paths of anything that might make someone trip, such as rocks or tools. Regularly check to see if handrails are loose or broken. Make sure that both sides of any steps have handrails. Any raised decks and porches should have guardrails on the edges. Have any leaves, snow, or ice cleared regularly. Use sand or salt on walking paths during winter. Clean up any spills in your garage right away. This includes oil or grease spills. What can I do in the bathroom? Use night lights. Install grab bars by the toilet and in the tub and shower. Do not use towel bars as grab bars. Use non-skid mats or  decals in the tub or shower. If you need to sit down in the shower, use a plastic, non-slip stool. Keep the floor dry. Clean up any water that spills on the floor as soon as it happens. Remove soap buildup in the tub or shower regularly. Attach bath mats securely with double-sided non-slip rug tape. Do not have throw rugs and other things on the floor that can make you trip. What can I do in the bedroom? Use night lights. Make sure that you have a light by your bed that is easy to reach. Do not use any sheets or blankets that are too big for your bed. They should not hang down onto the floor. Have a firm chair that has side arms. You can use this for support while you get dressed. Do not have throw rugs and other things on the floor that can make you trip. What can I do in the kitchen? Clean up any spills right away. Avoid walking on wet floors. Keep items that you use a lot in easy-to-reach places. If you need to reach something above you, use a strong step stool that has a grab bar. Keep electrical cords out of the way. Do not use floor polish or wax that makes floors slippery. If you must use wax, use non-skid floor wax. Do not have throw rugs and other things on the floor that can make you trip. What can I do with my stairs? Do not leave  any items on the stairs. Make sure that there are handrails on both sides of the stairs and use them. Fix handrails that are broken or loose. Make sure that handrails are as long as the stairways. Check any carpeting to make sure that it is firmly attached to the stairs. Fix any carpet that is loose or worn. Avoid having throw rugs at the top or bottom of the stairs. If you do have throw rugs, attach them to the floor with carpet tape. Make sure that you have a light switch at the top of the stairs and the bottom of the stairs. If you do not have them, ask someone to add them for you. What else can I do to help prevent falls? Wear shoes that: Do not  have high heels. Have rubber bottoms. Are comfortable and fit you well. Are closed at the toe. Do not wear sandals. If you use a stepladder: Make sure that it is fully opened. Do not climb a closed stepladder. Make sure that both sides of the stepladder are locked into place. Ask someone to hold it for you, if possible. Clearly mark and make sure that you can see: Any grab bars or handrails. First and last steps. Where the edge of each step is. Use tools that help you move around (mobility aids) if they are needed. These include: Canes. Walkers. Scooters. Crutches. Turn on the lights when you go into a dark area. Replace any light bulbs as soon as they burn out. Set up your furniture so you have a clear path. Avoid moving your furniture around. If any of your floors are uneven, fix them. If there are any pets around you, be aware of where they are. Review your medicines with your doctor. Some medicines can make you feel dizzy. This can increase your chance of falling. Ask your doctor what other things that you can do to help prevent falls. This information is not intended to replace advice given to you by your health care provider. Make sure you discuss any questions you have with your health care provider. Document Released: 04/27/2009 Document Revised: 12/07/2015 Document Reviewed: 08/05/2014 Elsevier Interactive Patient Education  2017 [employer].

## 2021-04-26 DIAGNOSIS — Z6824 Body mass index (BMI) 24.0-24.9, adult: Secondary | ICD-10-CM | POA: Diagnosis not present

## 2021-04-26 DIAGNOSIS — Z23 Encounter for immunization: Secondary | ICD-10-CM | POA: Diagnosis not present

## 2021-04-26 DIAGNOSIS — M0589 Other rheumatoid arthritis with rheumatoid factor of multiple sites: Secondary | ICD-10-CM | POA: Diagnosis not present

## 2021-04-26 DIAGNOSIS — M150 Primary generalized (osteo)arthritis: Secondary | ICD-10-CM | POA: Diagnosis not present

## 2021-04-26 DIAGNOSIS — Z79899 Other long term (current) drug therapy: Secondary | ICD-10-CM | POA: Diagnosis not present

## 2021-05-01 ENCOUNTER — Other Ambulatory Visit: Payer: Self-pay

## 2021-05-01 ENCOUNTER — Ambulatory Visit (INDEPENDENT_AMBULATORY_CARE_PROVIDER_SITE_OTHER): Payer: Medicare PPO

## 2021-05-01 DIAGNOSIS — Z23 Encounter for immunization: Secondary | ICD-10-CM

## 2021-05-08 DIAGNOSIS — G40219 Localization-related (focal) (partial) symptomatic epilepsy and epileptic syndromes with complex partial seizures, intractable, without status epilepticus: Secondary | ICD-10-CM | POA: Diagnosis not present

## 2021-05-08 DIAGNOSIS — Z79899 Other long term (current) drug therapy: Secondary | ICD-10-CM | POA: Diagnosis not present

## 2021-05-08 DIAGNOSIS — M130 Polyarthritis, unspecified: Secondary | ICD-10-CM | POA: Diagnosis not present

## 2021-05-08 DIAGNOSIS — R2689 Other abnormalities of gait and mobility: Secondary | ICD-10-CM | POA: Diagnosis not present

## 2021-07-26 DIAGNOSIS — M0589 Other rheumatoid arthritis with rheumatoid factor of multiple sites: Secondary | ICD-10-CM | POA: Diagnosis not present

## 2021-08-22 ENCOUNTER — Encounter: Payer: Self-pay | Admitting: Family Medicine

## 2021-08-22 ENCOUNTER — Ambulatory Visit: Payer: Medicare PPO | Admitting: Family Medicine

## 2021-08-22 VITALS — BP 127/59 | HR 65 | Ht 65.0 in | Wt 153.0 lb

## 2021-08-22 DIAGNOSIS — E039 Hypothyroidism, unspecified: Secondary | ICD-10-CM | POA: Diagnosis not present

## 2021-08-22 DIAGNOSIS — N400 Enlarged prostate without lower urinary tract symptoms: Secondary | ICD-10-CM | POA: Diagnosis not present

## 2021-08-22 DIAGNOSIS — E782 Mixed hyperlipidemia: Secondary | ICD-10-CM | POA: Diagnosis not present

## 2021-08-22 DIAGNOSIS — Z23 Encounter for immunization: Secondary | ICD-10-CM

## 2021-08-22 DIAGNOSIS — Z1211 Encounter for screening for malignant neoplasm of colon: Secondary | ICD-10-CM | POA: Diagnosis not present

## 2021-08-22 DIAGNOSIS — F339 Major depressive disorder, recurrent, unspecified: Secondary | ICD-10-CM | POA: Diagnosis not present

## 2021-08-22 NOTE — Progress Notes (Signed)
BP (!) 127/59    Pulse 65    Ht _0  (1.651 m)    Wt 153 lb (69.4 kg)    SpO2 99%    BMI 25.46 kg/m    Subjective:   Patient ID: Troy Genre., male    DOB: [birth year], 76 y.o.   MRN: [id number]  HPI: Troy Innocent. is a 76 y.o. male presenting on 08/22/2021 for Medical Management of Chronic Issues, Hyperlipidemia, and Hypothyroidism   HPI Hypothyroidism recheck Patient is coming in for thyroid recheck today as well. They deny any issues with hair changes or heat or cold problems or diarrhea or constipation. They deny any chest pain or palpitations. They are currently on levothyroxine 50 micrograms   Hyperlipidemia Patient is coming in for recheck of his hyperlipidemia. The patient is currently taking fish oil and Zetia and atorvastatin. They deny any issues with myalgias or history of liver damage from it. They deny any focal numbness or weakness or chest pain.   BPH Patient is coming in for recheck on BPH Symptoms: None currently Medication: None currently Last PSA: 2 years ago, normal  Depression anxiety recheck Patient is coming in for depression recheck.  Currently on Lexapro and he feels like things are doing good and his mood is good. Depression screen [facility] 2/9 08/22/2021 02/26/2021 02/19/2021 08/23/2020 03/21/2020  Decreased Interest 0 0 0 0 0  Down, Depressed, Hopeless 0 0 0 0 0  PHQ - 2 Score 0 0 0 0 0  Altered sleeping 0 - - - -  Tired, decreased energy 0 - - - -  Change in appetite 0 - - - -  Feeling bad or failure about yourself  0 - - - -  Trouble concentrating 0 - - - -  Moving slowly or fidgety/restless 1 - - - -  Suicidal thoughts 0 - - - -  PHQ-9 Score 1 - - - -     Relevant past medical, surgical, family and social history reviewed and updated as indicated. Interim medical history since our last visit reviewed. Allergies and medications reviewed and updated.  Review of Systems  Constitutional:  Negative for chills and fever.  Eyes:  Negative for visual  disturbance.  Respiratory:  Negative for shortness of breath and wheezing.   Cardiovascular:  Negative for chest pain and leg swelling.  Musculoskeletal:  Positive for arthralgias. Negative for back pain and gait problem.  Skin:  Negative for rash.  Neurological:  Negative for dizziness, weakness and light-headedness.  All other systems reviewed and are negative.  Per HPI unless specifically indicated above   Allergies as of 08/22/2021       Reactions   Fentanyl Other (See Comments)   In high doses of 100 mcg pt's O2 sats decrease to 60% RA   Hydrocodone    Intolerance         Medication List        Accurate as of August 22, 2021 11:28 AM. If you have any questions, ask your nurse or doctor.          aspirin 81 MG EC tablet Take 81 mg by mouth daily.   atorvastatin 40 MG tablet Commonly known as: LIPITOR Take 1 tablet (40 mg total) by mouth daily at 6 PM.   escitalopram 20 MG tablet Commonly known as: LEXAPRO Take 1 tablet (20 mg total) by mouth daily.   ezetimibe 10 MG tablet Commonly known as: ZETIA TAKE ONE TABLET BY  MOUTH DAILY FOR HYPERLIPIDERMEA   folic acid 1 MG tablet Commonly known as: FOLVITE Take 1 mg by mouth 2 (two) times daily.   guaiFENesin 600 MG 12 hr tablet Commonly known as: MUCINEX Take 600 mg by mouth as needed.   lacosamide 200 MG Tabs tablet Commonly known as: VIMPAT Take 200 mg by mouth 2 (two) times daily.   levothyroxine 50 MCG tablet Commonly known as: SYNTHROID Take 1 tablet (50 mcg total) by mouth daily.   methotrexate 2.5 MG tablet Commonly known as: RHEUMATREX Take 10 mg by mouth once a week.   omega-3 fish oil 1000 MG Caps capsule Commonly known as: MAXEPA Take 2 capsules by mouth daily.   omeprazole 20 MG capsule Commonly known as: PRILOSEC TAKE 1 CAPSULE BY MOUTH EVERY DAY   primidone 250 MG tablet Commonly known as: MYSOLINE 250 mg. 1 in the am and 1 at bedtime   Vitamin D3 10 MCG (400 UNIT) Caps Take  800 Units by mouth.         Objective:   BP (!) 127/59    Pulse 65    Ht _0  (1.651 m)    Wt 153 lb (69.4 kg)    SpO2 99%    BMI 25.46 kg/m   Wt Readings from Last 3 Encounters:  08/22/21 153 lb (69.4 kg)  02/26/21 148 lb (67.1 kg)  02/19/21 148 lb (67.1 kg)    Physical Exam Vitals and nursing note reviewed.  Constitutional:      General: He is not in acute distress.    Appearance: He is well-developed. He is not diaphoretic.  Eyes:     General: No scleral icterus.    Conjunctiva/sclera: Conjunctivae normal.  Neck:     Thyroid: No thyromegaly.  Cardiovascular:     Rate and Rhythm: Normal rate and regular rhythm.     Heart sounds: Normal heart sounds. No murmur heard. Pulmonary:     Effort: Pulmonary effort is normal. No respiratory distress.     Breath sounds: Normal breath sounds. No wheezing.  Musculoskeletal:        General: No swelling. Normal range of motion.     Cervical back: Neck supple.  Lymphadenopathy:     Cervical: No cervical adenopathy.  Skin:    General: Skin is warm and dry.     Findings: No rash.  Neurological:     Mental Status: He is alert and oriented to person, place, and time.     Coordination: Coordination normal.  Psychiatric:        Behavior: Behavior normal.      Assessment & Plan:   Problem List Items Addressed This Visit       Endocrine   Hypothyroidism   Relevant Orders   CBC with Differential/Platelet   CMP14+EGFR   Lipid panel   TSH     Genitourinary   BPH (benign prostatic hyperplasia)     Other   Hyperlipidemia - Primary   Relevant Orders   CBC with Differential/Platelet   CMP14+EGFR   Lipid panel   TSH   Depression, recurrent ([place])   Other Visit Diagnoses     Colon cancer screening       Relevant Orders   Ambulatory referral to Gastroenterology   Need for shingles vaccine       Relevant Orders   Varicella-zoster vaccine IM (Shingrix) (Completed)       Seems to be doing well, blood pressure looks  good, will check blood work to  make sure everything is doing well but he denies any major issues except for his normal arthritis. Follow up plan: Return in about 6 months (around 02/19/2022), or if symptoms worsen or fail to improve, for Physical and hypertension and cholesterol.  Counseling provided for all of the vaccine components Orders Placed This Encounter  Procedures   Varicella-zoster vaccine IM (Shingrix)   CBC with Differential/Platelet   CMP14+EGFR   Lipid panel   TSH   Ambulatory referral to Gastroenterology    Caryl Pina, MD [facility] 08/22/2021, 11:28 AM

## 2021-08-23 LAB — CBC WITH DIFFERENTIAL/PLATELET
Basophils Absolute: 0.1 10*3/uL (ref 0.0–0.2)
Basos: 1 %
EOS (ABSOLUTE): 0.1 10*3/uL (ref 0.0–0.4)
Eos: 1 %
Hematocrit: 38.3 % (ref 37.5–51.0)
Hemoglobin: 13.3 g/dL (ref 13.0–17.7)
Immature Grans (Abs): 0 10*3/uL (ref 0.0–0.1)
Immature Granulocytes: 0 %
Lymphocytes Absolute: 1.9 10*3/uL (ref 0.7–3.1)
Lymphs: 28 %
MCH: 34.9 pg — ABNORMAL HIGH (ref 26.6–33.0)
MCHC: 34.7 g/dL (ref 31.5–35.7)
MCV: 101 fL — ABNORMAL HIGH (ref 79–97)
Monocytes Absolute: 0.7 10*3/uL (ref 0.1–0.9)
Monocytes: 10 %
Neutrophils Absolute: 4 10*3/uL (ref 1.4–7.0)
Neutrophils: 60 %
Platelets: 274 10*3/uL (ref 150–450)
RBC: 3.81 x10E6/uL — ABNORMAL LOW (ref 4.14–5.80)
RDW: 13.1 % (ref 11.6–15.4)
WBC: 6.7 10*3/uL (ref 3.4–10.8)

## 2021-08-23 LAB — CMP14+EGFR
ALT: 44 IU/L (ref 0–44)
AST: 48 IU/L — ABNORMAL HIGH (ref 0–40)
Albumin/Globulin Ratio: 1.9 (ref 1.2–2.2)
Albumin: 4.4 g/dL (ref 3.7–4.7)
Alkaline Phosphatase: 90 IU/L (ref 44–121)
BUN/Creatinine Ratio: 13 (ref 10–24)
BUN: 15 mg/dL (ref 8–27)
Bilirubin Total: 0.3 mg/dL (ref 0.0–1.2)
CO2: 27 mmol/L (ref 20–29)
Calcium: 9.3 mg/dL (ref 8.6–10.2)
Chloride: 100 mmol/L (ref 96–106)
Creatinine, Ser: 1.17 mg/dL (ref 0.76–1.27)
Globulin, Total: 2.3 g/dL (ref 1.5–4.5)
Glucose: 88 mg/dL (ref 70–99)
Potassium: 5.1 mmol/L (ref 3.5–5.2)
Sodium: 140 mmol/L (ref 134–144)
Total Protein: 6.7 g/dL (ref 6.0–8.5)
eGFR: 65 mL/min/{1.73_m2} (ref >59)

## 2021-08-23 LAB — LIPID PANEL
Chol/HDL Ratio: 2.2 ratio (ref 0.0–5.0)
Cholesterol, Total: 135 mg/dL (ref 100–199)
HDL: 61 mg/dL (ref >39)
LDL Chol Calc (NIH): 57 mg/dL (ref 0–99)
Triglycerides: 91 mg/dL (ref 0–149)
VLDL Cholesterol Cal: 17 mg/dL (ref 5–40)

## 2021-08-23 LAB — TSH: TSH: 3.54 u[IU]/mL (ref 0.450–4.500)

## 2021-08-27 ENCOUNTER — Encounter (INDEPENDENT_AMBULATORY_CARE_PROVIDER_SITE_OTHER): Payer: Self-pay | Admitting: *Deleted

## 2021-09-27 ENCOUNTER — Other Ambulatory Visit: Payer: Self-pay | Admitting: Family Medicine

## 2021-10-17 ENCOUNTER — Other Ambulatory Visit: Payer: Self-pay | Admitting: Family Medicine

## 2021-10-17 MED ORDER — OMEPRAZOLE 20 MG PO CPDR: 20.0000 mg | DELAYED_RELEASE_CAPSULE | Freq: Every day | ORAL | 1 refills | Status: DC

## 2021-10-17 NOTE — Telephone Encounter (Signed)
Refill failed. resent °

## 2021-10-17 NOTE — Addendum Note (Signed)
Addended by: Antonietta Barcelona D on: 10/17/2021 03:42 PM ? ? Modules accepted: Orders ? ?

## 2021-10-22 DIAGNOSIS — G40219 Localization-related (focal) (partial) symptomatic epilepsy and epileptic syndromes with complex partial seizures, intractable, without status epilepticus: Secondary | ICD-10-CM | POA: Diagnosis not present

## 2021-10-22 DIAGNOSIS — M130 Polyarthritis, unspecified: Secondary | ICD-10-CM | POA: Diagnosis not present

## 2021-10-22 DIAGNOSIS — R2689 Other abnormalities of gait and mobility: Secondary | ICD-10-CM | POA: Diagnosis not present

## 2021-10-22 DIAGNOSIS — Z79899 Other long term (current) drug therapy: Secondary | ICD-10-CM | POA: Diagnosis not present

## 2021-10-24 DIAGNOSIS — Z6824 Body mass index (BMI) 24.0-24.9, adult: Secondary | ICD-10-CM | POA: Diagnosis not present

## 2021-10-24 DIAGNOSIS — M0589 Other rheumatoid arthritis with rheumatoid factor of multiple sites: Secondary | ICD-10-CM | POA: Diagnosis not present

## 2021-10-24 DIAGNOSIS — Z79899 Other long term (current) drug therapy: Secondary | ICD-10-CM | POA: Diagnosis not present

## 2021-10-24 DIAGNOSIS — M1991 Primary osteoarthritis, unspecified site: Secondary | ICD-10-CM | POA: Diagnosis not present

## 2021-12-23 ENCOUNTER — Other Ambulatory Visit: Payer: Self-pay | Admitting: Family Medicine

## 2021-12-24 DIAGNOSIS — H2513 Age-related nuclear cataract, bilateral: Secondary | ICD-10-CM | POA: Diagnosis not present

## 2021-12-24 DIAGNOSIS — H5203 Hypermetropia, bilateral: Secondary | ICD-10-CM | POA: Diagnosis not present

## 2022-01-23 DIAGNOSIS — M0589 Other rheumatoid arthritis with rheumatoid factor of multiple sites: Secondary | ICD-10-CM | POA: Diagnosis not present

## 2022-02-18 ENCOUNTER — Ambulatory Visit: Payer: Medicare PPO | Admitting: Family Medicine

## 2022-02-18 ENCOUNTER — Encounter: Payer: Self-pay | Admitting: Family Medicine

## 2022-02-18 VITALS — BP 129/61 | HR 61 | Temp 97.4°F | Ht 64.0 in | Wt 150.0 lb

## 2022-02-18 DIAGNOSIS — N400 Enlarged prostate without lower urinary tract symptoms: Secondary | ICD-10-CM

## 2022-02-18 DIAGNOSIS — Z23 Encounter for immunization: Secondary | ICD-10-CM | POA: Diagnosis not present

## 2022-02-18 DIAGNOSIS — F339 Major depressive disorder, recurrent, unspecified: Secondary | ICD-10-CM

## 2022-02-18 DIAGNOSIS — E782 Mixed hyperlipidemia: Secondary | ICD-10-CM | POA: Diagnosis not present

## 2022-02-18 DIAGNOSIS — E039 Hypothyroidism, unspecified: Secondary | ICD-10-CM

## 2022-02-18 DIAGNOSIS — K219 Gastro-esophageal reflux disease without esophagitis: Secondary | ICD-10-CM

## 2022-02-18 MED ORDER — OMEPRAZOLE 20 MG PO CPDR: 20.0000 mg | DELAYED_RELEASE_CAPSULE | Freq: Every day | ORAL | 3 refills | Status: DC

## 2022-02-18 MED ORDER — ATORVASTATIN CALCIUM 40 MG PO TABS: 40.0000 mg | ORAL_TABLET | Freq: Every day | ORAL | 3 refills | Status: DC

## 2022-02-18 MED ORDER — LEVOTHYROXINE SODIUM 50 MCG PO TABS: 50.0000 ug | ORAL_TABLET | Freq: Every day | ORAL | 3 refills | Status: DC

## 2022-02-18 MED ORDER — EZETIMIBE 10 MG PO TABS: 10.0000 mg | ORAL_TABLET | Freq: Every day | ORAL | 3 refills | Status: DC

## 2022-02-18 NOTE — Addendum Note (Signed)
Addended by: Alphonzo Dublin on: 02/18/2022 11:47 AM   Modules accepted: Orders

## 2022-02-18 NOTE — Progress Notes (Signed)
BP 129/61   Pulse 61   Temp (!) 97.4 F (36.3 C)   Ht '5\' 4"'  (1.626 m)   Wt 150 lb (68 kg)   SpO2 98%   BMI 25.75 kg/m    Subjective:   Patient ID: Troy Genre., male    DOB: [birth year], 76 y.o.   MRN: [id number]  HPI: Troy Styer. is a 76 y.o. male presenting on 02/18/2022 for Medical Management of Chronic Issues, Hyperlipidemia, Hypothyroidism, and Gastroesophageal Reflux   HPI Hyperlipidemia Patient is coming in for recheck of his hyperlipidemia. The patient is currently taking atorvastatin and Zetia and fish oils. They deny any issues with myalgias or history of liver damage from it. They deny any focal numbness or weakness or chest pain.   Hypothyroidism recheck Patient is coming in for thyroid recheck today as well. They deny any issues with hair changes or heat or cold problems or diarrhea or constipation. They deny any chest pain or palpitations. They are currently on levothyroxine 50 micrograms   GERD Patient is currently on omeprazole.  She denies any major symptoms or abdominal pain or belching or burping. She denies any blood in her stool or lightheadedness or dizziness.   Patient also still sees neurology for seizure disorder and denies any seizures or issues there.  Depression recheck Patient is coming in today for depression recheck.  He is currently on according to our records still on the Lexapro.  He feels like he is doing well.  Denies any major issues.    02/18/2022   10:26 AM 08/22/2021   10:27 AM 02/26/2021   11:05 AM 02/19/2021   10:21 AM 08/23/2020   10:41 AM  Depression screen PHQ 2/9  Decreased Interest 0 0 0 0 0  Down, Depressed, Hopeless 0 0 0 0 0  PHQ - 2 Score 0 0 0 0 0  Altered sleeping  0     Tired, decreased energy  0     Change in appetite  0     Feeling bad or failure about yourself   0     Trouble concentrating  0     Moving slowly or fidgety/restless  1     Suicidal thoughts  0     PHQ-9 Score  1       BPH Patient is coming in  for recheck on BPH Symptoms: Denies any urinary issues Medication: None currently Last PSA: 2020  Relevant past medical, surgical, family and social history reviewed and updated as indicated. Interim medical history since our last visit reviewed. Allergies and medications reviewed and updated.  Review of Systems  Constitutional:  Negative for chills and fever.  Eyes:  Negative for visual disturbance.  Respiratory:  Negative for shortness of breath and wheezing.   Cardiovascular:  Negative for chest pain and leg swelling.  Musculoskeletal:  Negative for back pain and gait problem.  Skin:  Negative for rash.  Neurological:  Negative for dizziness, weakness and light-headedness.  All other systems reviewed and are negative.   Per HPI unless specifically indicated above   Allergies as of 02/18/2022       Reactions   Fentanyl Other (See Comments)   In high doses of 100 mcg pt's O2 sats decrease to 60% RA   Hydrocodone    Intolerance         Medication List        Accurate as of February 18, 2022 10:42 AM. If you have any  questions, ask your nurse or doctor.          aspirin EC 81 MG tablet Take 81 mg by mouth daily.   atorvastatin 40 MG tablet Commonly known as: LIPITOR Take 1 tablet (40 mg total) by mouth daily. What changed: when to take this Changed by: Worthy Rancher, MD   escitalopram 20 MG tablet Commonly known as: LEXAPRO Take 1 tablet (20 mg total) by mouth daily.   ezetimibe 10 MG tablet Commonly known as: ZETIA Take 1 tablet (10 mg total) by mouth daily. What changed: See the new instructions. Changed by: Fransisca Kaufmann Anneth Brunell, MD   folic acid 1 MG tablet Commonly known as: FOLVITE Take 1 mg by mouth 2 (two) times daily.   guaiFENesin 600 MG 12 hr tablet Commonly known as: MUCINEX Take 600 mg by mouth as needed.   lacosamide 200 MG Tabs tablet Commonly known as: VIMPAT Take 200 mg by mouth 2 (two) times daily.   levothyroxine 50 MCG  tablet Commonly known as: SYNTHROID Take 1 tablet (50 mcg total) by mouth daily.   methotrexate 2.5 MG tablet Commonly known as: RHEUMATREX Take 10 mg by mouth once a week.   omega-3 fish oil 1000 MG Caps capsule Commonly known as: MAXEPA Take 2 capsules by mouth daily.   omeprazole 20 MG capsule Commonly known as: PRILOSEC Take 1 capsule (20 mg total) by mouth daily.   primidone 250 MG tablet Commonly known as: MYSOLINE 250 mg. 1 in the am and 1 at bedtime   Vitamin D3 10 MCG (400 UNIT) Caps Take 800 Units by mouth.         Objective:   BP 129/61   Pulse 61   Temp (!) 97.4 F (36.3 C)   Ht '5\' 4"'  (1.626 m)   Wt 150 lb (68 kg)   SpO2 98%   BMI 25.75 kg/m   Wt Readings from Last 3 Encounters:  02/18/22 150 lb (68 kg)  08/22/21 153 lb (69.4 kg)  02/26/21 148 lb (67.1 kg)    Physical Exam Vitals and nursing note reviewed.  Constitutional:      General: He is not in acute distress.    Appearance: He is well-developed. He is not diaphoretic.  Eyes:     General: No scleral icterus.    Conjunctiva/sclera: Conjunctivae normal.  Neck:     Thyroid: No thyromegaly.  Cardiovascular:     Rate and Rhythm: Normal rate and regular rhythm.     Heart sounds: Normal heart sounds. No murmur heard. Pulmonary:     Effort: Pulmonary effort is normal. No respiratory distress.     Breath sounds: Normal breath sounds. No wheezing.  Musculoskeletal:        General: Normal range of motion.     Cervical back: Neck supple.  Lymphadenopathy:     Cervical: No cervical adenopathy.  Skin:    General: Skin is warm and dry.     Findings: No rash.  Neurological:     Mental Status: He is alert and oriented to person, place, and time.     Coordination: Coordination normal.  Psychiatric:        Behavior: Behavior normal.       Assessment & Plan:   Problem List Items Addressed This Visit       Digestive   GERD (gastroesophageal reflux disease)   Relevant Medications    omeprazole (PRILOSEC) 20 MG capsule   Other Relevant Orders   CBC with Differential/Platelet  CMP14+EGFR     Endocrine   Hypothyroidism - Primary   Relevant Medications   levothyroxine (SYNTHROID) 50 MCG tablet   Other Relevant Orders   TSH     Genitourinary   BPH (benign prostatic hyperplasia)   Relevant Orders   PSA, total and free     Other   Hyperlipidemia   Relevant Medications   ezetimibe (ZETIA) 10 MG tablet   atorvastatin (LIPITOR) 40 MG tablet   Other Relevant Orders   CMP14+EGFR   Lipid panel   Depression, recurrent ([place])   Relevant Orders   CBC with Differential/Platelet    Blood pressure heart rate and everything else looks good, We will wait for blood work, no changes today Follow up plan: Return in about 6 months (around 08/21/2022), or if symptoms worsen or fail to improve, for Thyroid and cholesterol recheck.  Counseling provided for all of the vaccine components Orders Placed This Encounter  Procedures   CBC with Differential/Platelet   CMP14+EGFR   Lipid panel   PSA, total and free   TSH    Caryl Pina, MD [facility] 02/18/2022, 10:42 AM

## 2022-02-19 LAB — CMP14+EGFR
ALT: 26 IU/L (ref 0–44)
AST: 36 IU/L (ref 0–40)
Albumin/Globulin Ratio: 1.6 (ref 1.2–2.2)
Albumin: 4.2 g/dL (ref 3.8–4.8)
Alkaline Phosphatase: 84 IU/L (ref 44–121)
BUN/Creatinine Ratio: 10 (ref 10–24)
BUN: 12 mg/dL (ref 8–27)
Bilirubin Total: 0.2 mg/dL (ref 0.0–1.2)
CO2: 26 mmol/L (ref 20–29)
Calcium: 8.9 mg/dL (ref 8.6–10.2)
Chloride: 99 mmol/L (ref 96–106)
Creatinine, Ser: 1.23 mg/dL (ref 0.76–1.27)
Globulin, Total: 2.6 g/dL (ref 1.5–4.5)
Glucose: 89 mg/dL (ref 70–99)
Potassium: 4.9 mmol/L (ref 3.5–5.2)
Sodium: 138 mmol/L (ref 134–144)
Total Protein: 6.8 g/dL (ref 6.0–8.5)
eGFR: 61 mL/min/{1.73_m2} (ref >59)

## 2022-02-19 LAB — LIPID PANEL
Chol/HDL Ratio: 2.4 ratio (ref 0.0–5.0)
Cholesterol, Total: 127 mg/dL (ref 100–199)
HDL: 53 mg/dL (ref >39)
LDL Chol Calc (NIH): 57 mg/dL (ref 0–99)
Triglycerides: 91 mg/dL (ref 0–149)
VLDL Cholesterol Cal: 17 mg/dL (ref 5–40)

## 2022-02-19 LAB — CBC WITH DIFFERENTIAL/PLATELET
Basophils Absolute: 0 10*3/uL (ref 0.0–0.2)
Basos: 1 %
EOS (ABSOLUTE): 0.1 10*3/uL (ref 0.0–0.4)
Eos: 2 %
Hematocrit: 36.4 % — ABNORMAL LOW (ref 37.5–51.0)
Hemoglobin: 13 g/dL (ref 13.0–17.7)
Immature Grans (Abs): 0 10*3/uL (ref 0.0–0.1)
Immature Granulocytes: 0 %
Lymphocytes Absolute: 1.8 10*3/uL (ref 0.7–3.1)
Lymphs: 24 %
MCH: 35.7 pg — ABNORMAL HIGH (ref 26.6–33.0)
MCHC: 35.7 g/dL (ref 31.5–35.7)
MCV: 100 fL — ABNORMAL HIGH (ref 79–97)
Monocytes Absolute: 1.1 10*3/uL — ABNORMAL HIGH (ref 0.1–0.9)
Monocytes: 15 %
Neutrophils Absolute: 4.4 10*3/uL (ref 1.4–7.0)
Neutrophils: 58 %
Platelets: 274 10*3/uL (ref 150–450)
RBC: 3.64 x10E6/uL — ABNORMAL LOW (ref 4.14–5.80)
RDW: 13 % (ref 11.6–15.4)
WBC: 7.6 10*3/uL (ref 3.4–10.8)

## 2022-02-19 LAB — PSA, TOTAL AND FREE
PSA, Free Pct: 56.7 %
PSA, Free: 0.17 ng/mL
Prostate Specific Ag, Serum: 0.3 ng/mL (ref 0.0–4.0)

## 2022-02-19 LAB — TSH: TSH: 3.53 u[IU]/mL (ref 0.450–4.500)

## 2022-02-27 ENCOUNTER — Telehealth: Payer: Self-pay

## 2022-02-27 ENCOUNTER — Ambulatory Visit (INDEPENDENT_AMBULATORY_CARE_PROVIDER_SITE_OTHER): Payer: Medicare PPO

## 2022-02-27 VITALS — Wt 150.0 lb

## 2022-02-27 DIAGNOSIS — Z0000 Encounter for general adult medical examination without abnormal findings: Secondary | ICD-10-CM | POA: Diagnosis not present

## 2022-02-27 NOTE — Patient Instructions (Signed)
[name] , Thank you for taking time to come for your Medicare Wellness Visit. I appreciate your ongoing commitment to your health goals. Please review the following plan we discussed and let me know if I can assist you in the future.   Screening recommendations/referrals: Colonoscopy: Done 05/10/2014 - repeat was recommended after 5 years - discuss with Dr Dettinger Recommended yearly ophthalmology/optometry visit for glaucoma screening and checkup Recommended yearly dental visit for hygiene and checkup  Vaccinations: Influenza vaccine: Done 05/01/2021 -Repeat annually  Pneumococcal vaccine: Done 12/19/2010 & 08/23/2013    Tdap vaccine: Done 06/01/2018 - Repeat in 10 years  Shingles vaccine: Done  08/22/2021 & 02/18/2022  Covid-19: Done  08/20/2019, 09/18/2019, 05/22/2020, 11/23/2020  Advanced directives: Please bring a copy of your health care power of attorney and living will to the office to be added to your chart at your convenience.   Conditions/risks identified: Keep up the great work! Aim for 30 minutes of exercise or brisk walking, 6-8 glasses of water, and 5 servings of fruits and vegetables each day.   Next appointment: Follow up in one year for your annual wellness visit.   Preventive Care 76 Years and Older, Male  Preventive care refers to lifestyle choices and visits with your health care provider that can promote health and wellness. What does preventive care include? A yearly physical exam. This is also called an annual well check. Dental exams once or twice a year. Routine eye exams. Ask your health care provider how often you should have your eyes checked. Personal lifestyle choices, including: Daily care of your teeth and gums. Regular physical activity. Eating a healthy diet. Avoiding tobacco and drug use. Limiting alcohol use. Practicing safe sex. Taking low doses of aspirin every day. Taking vitamin and mineral supplements as recommended by your health care  provider. What happens during an annual well check? The services and screenings done by your health care provider during your annual well check will depend on your age, overall health, lifestyle risk factors, and family history of disease. Counseling  Your health care provider may ask you questions about your: Alcohol use. Tobacco use. Drug use. Emotional well-being. Home and relationship well-being. Sexual activity. Eating habits. History of falls. Memory and ability to understand (cognition). Work and work Statistician. Screening  You may have the following tests or measurements: Height, weight, and BMI. Blood pressure. Lipid and cholesterol levels. These may be checked every 5 years, or more frequently if you are over 76 years old. Skin check. Lung cancer screening. You may have this screening every year starting at age 76 if you have a 30-pack-year history of smoking and currently smoke or have quit within the past 15 years. Fecal occult blood test (FOBT) of the stool. You may have this test every year starting at age 76. Flexible sigmoidoscopy or colonoscopy. You may have a sigmoidoscopy every 5 years or a colonoscopy every 10 years starting at age 76. Prostate cancer screening. Recommendations will vary depending on your family history and other risks. Hepatitis C blood test. Hepatitis B blood test. Sexually transmitted disease (STD) testing. Diabetes screening. This is done by checking your blood sugar (glucose) after you have not eaten for a while (fasting). You may have this done every 1-3 years. Abdominal aortic aneurysm (AAA) screening. You may need this if you are a current or former smoker. Osteoporosis. You may be screened starting at age 4 if you are at high risk. Talk with your health care provider about your  test results, treatment options, and if necessary, the need for more tests. Vaccines  Your health care provider may recommend certain vaccines, such  as: Influenza vaccine. This is recommended every year. Tetanus, diphtheria, and acellular pertussis (Tdap, Td) vaccine. You may need a Td booster every 10 years. Zoster vaccine. You may need this after age 76. Pneumococcal 13-valent conjugate (PCV13) vaccine. One dose is recommended after age 76. Pneumococcal polysaccharide (PPSV23) vaccine. One dose is recommended after age 76. Talk to your health care provider about which screenings and vaccines you need and how often you need them. This information is not intended to replace advice given to you by your health care provider. Make sure you discuss any questions you have with your health care provider. Document Released: 07/28/2015 Document Revised: 03/20/2016 Document Reviewed: 05/02/2015 Elsevier Interactive Patient Education  2017 [place] Prevention in the Home Falls can cause injuries. They can happen to people of all ages. There are many things you can do to make your home safe and to help prevent falls. What can I do on the outside of my home? Regularly fix the edges of walkways and driveways and fix any cracks. Remove anything that might make you trip as you walk through a door, such as a raised step or threshold. Trim any bushes or trees on the path to your home. Use bright outdoor lighting. Clear any walking paths of anything that might make someone trip, such as rocks or tools. Regularly check to see if handrails are loose or broken. Make sure that both sides of any steps have handrails. Any raised decks and porches should have guardrails on the edges. Have any leaves, snow, or ice cleared regularly. Use sand or salt on walking paths during winter. Clean up any spills in your garage right away. This includes oil or grease spills. What can I do in the bathroom? Use night lights. Install grab bars by the toilet and in the tub and shower. Do not use towel bars as grab bars. Use non-skid mats or decals in the tub or  shower. If you need to sit down in the shower, use a plastic, non-slip stool. Keep the floor dry. Clean up any water that spills on the floor as soon as it happens. Remove soap buildup in the tub or shower regularly. Attach bath mats securely with double-sided non-slip rug tape. Do not have throw rugs and other things on the floor that can make you trip. What can I do in the bedroom? Use night lights. Make sure that you have a light by your bed that is easy to reach. Do not use any sheets or blankets that are too big for your bed. They should not hang down onto the floor. Have a firm chair that has side arms. You can use this for support while you get dressed. Do not have throw rugs and other things on the floor that can make you trip. What can I do in the kitchen? Clean up any spills right away. Avoid walking on wet floors. Keep items that you use a lot in easy-to-reach places. If you need to reach something above you, use a strong step stool that has a grab bar. Keep electrical cords out of the way. Do not use floor polish or wax that makes floors slippery. If you must use wax, use non-skid floor wax. Do not have throw rugs and other things on the floor that can make you trip. What can I do with my  stairs? Do not leave any items on the stairs. Make sure that there are handrails on both sides of the stairs and use them. Fix handrails that are broken or loose. Make sure that handrails are as long as the stairways. Check any carpeting to make sure that it is firmly attached to the stairs. Fix any carpet that is loose or worn. Avoid having throw rugs at the top or bottom of the stairs. If you do have throw rugs, attach them to the floor with carpet tape. Make sure that you have a light switch at the top of the stairs and the bottom of the stairs. If you do not have them, ask someone to add them for you. What else can I do to help prevent falls? Wear shoes that: Do not have high heels. Have  rubber bottoms. Are comfortable and fit you well. Are closed at the toe. Do not wear sandals. If you use a stepladder: Make sure that it is fully opened. Do not climb a closed stepladder. Make sure that both sides of the stepladder are locked into place. Ask someone to hold it for you, if possible. Clearly mark and make sure that you can see: Any grab bars or handrails. First and last steps. Where the edge of each step is. Use tools that help you move around (mobility aids) if they are needed. These include: Canes. Walkers. Scooters. Crutches. Turn on the lights when you go into a dark area. Replace any light bulbs as soon as they burn out. Set up your furniture so you have a clear path. Avoid moving your furniture around. If any of your floors are uneven, fix them. If there are any pets around you, be aware of where they are. Review your medicines with your doctor. Some medicines can make you feel dizzy. This can increase your chance of falling. Ask your doctor what other things that you can do to help prevent falls. This information is not intended to replace advice given to you by your health care provider. Make sure you discuss any questions you have with your health care provider. Document Released: 04/27/2009 Document Revised: 12/07/2015 Document Reviewed: 08/05/2014 Elsevier Interactive Patient Education  2017 [place]

## 2022-02-27 NOTE — Telephone Encounter (Signed)
I do not know why they tell him to come back in 5 years, has he been high risk in the past because on his last colonoscopy was clean but usually when you are high risk and they tell you to come back sooner that person is not safe to do a Cologuard because Cologuard is only for low risk.  I would discuss this with his gastroenterologist unless he knows why he was deemed more high risk and needed to do it in 5 years instead of 10.

## 2022-02-27 NOTE — Telephone Encounter (Signed)
Last colonoscopy was 2015 - he was advised to repeat this in 5 years - He is afraid of sedation due to epilepsy and medical problems. Wants to know if he can do Cologuard instead and/ or do you even recommend colonoscopy at his age?

## 2022-02-27 NOTE — Progress Notes (Signed)
Subjective:   Troy Rodgers. is a 76 y.o. male who presents for Medicare Annual/Subsequent preventive examination.  Virtual Visit via Telephone Note  I connected with  Troy Rodgers. on 02/27/22 at 11:15 AM EDT by telephone and verified that I am speaking with the correct person using two identifiers.  Location: Patient: Home Provider: [facility] Persons participating in the virtual visit: patient/ wife Troy Rodgers/ Nurse Health Advisor   I discussed the limitations, risks, security and privacy concerns of performing an evaluation and management service by telephone and the availability of in person appointments. The patient expressed understanding and agreed to proceed.  Interactive audio and video telecommunications were attempted between this nurse and patient, however failed, due to patient having technical difficulties OR patient did not have access to video capability.  We continued and completed visit with audio only.  Some vital signs may be absent or patient reported.   Rachele Lamaster E Jshon Ibe, LPN   Review of Systems     Cardiac Risk Factors include: advanced age (>84mn, >>75women);male gender     Objective:    Today's Vitals   02/27/22 1112  Weight: 150 lb (68 kg)   Body mass index is 25.75 kg/m.     02/27/2022   11:19 AM 02/26/2021   11:22 AM 05/19/2019    1:21 PM 10/29/2017    3:07 PM 10/28/2016    9:37 PM 04/28/2013   11:00 AM 04/28/2013   12:00 AM  Advanced Directives  Does Patient Have a Medical Advance Directive? No Yes Yes No No  Patient does not have advance directive;Patient would not like information  Type of APension scheme managerPower of [place] will [place] Chart?  No - copy requested       Would patient like information on creating a medical advance directive? No - Patient declined  No - Patient declined Yes (MAU/Ambulatory/Procedural Areas - Information given)     Pre-existing  out of facility DNR order (yellow form or pink MOST form)      No No    Current Medications (verified) Outpatient Encounter Medications as of 02/27/2022  Medication Sig   aspirin 81 MG EC tablet Take 81 mg by mouth daily.     atorvastatin (LIPITOR) 40 MG tablet Take 1 tablet (40 mg total) by mouth daily.   Cholecalciferol (VITAMIN D3) 10 MCG (400 UNIT) CAPS Take 800 Units by mouth.   escitalopram (LEXAPRO) 20 MG tablet Take 1 tablet (20 mg total) by mouth daily.   ezetimibe (ZETIA) 10 MG tablet Take 1 tablet (10 mg total) by mouth daily.   folic acid (FOLVITE) 1 MG tablet Take 1 mg by mouth 2 (two) times daily.    lacosamide (VIMPAT) 200 MG TABS tablet Take 200 mg by mouth 2 (two) times daily.   levothyroxine (SYNTHROID) 50 MCG tablet Take 1 tablet (50 mcg total) by mouth daily.   methotrexate (RHEUMATREX) 2.5 MG tablet Take 10 mg by mouth once a week.    omega-3 fish oil (MAXEPA) 1000 MG CAPS capsule Take 2 capsules by mouth daily.   omeprazole (PRILOSEC) 20 MG capsule Take 1 capsule (20 mg total) by mouth daily.   primidone (MYSOLINE) 250 MG tablet 250 mg. 1 in the am and 1 at bedtime   guaiFENesin (MUCINEX) 600 MG 12 hr tablet Take 600 mg by mouth as needed.  (Patient not taking: Reported on 02/27/2022)  No facility-administered encounter medications on file as of 02/27/2022.    Allergies (verified) Fentanyl and Hydrocodone   History: Past Medical History:  Diagnosis Date   Abdominal pain    Arthritis    rheumatoid    Asthma    Bradycardia    Cataract    Chest pain    Cholelithiases    Depressive disorder, not elsewhere classified    GERD (gastroesophageal reflux disease)    Hearing deficit, bilateral    HLD (hyperlipidemia)    Hyperplasia of prostate    Impotence of organic origin    Kidney stone    Localization-related (focal) (partial) epilepsy and epileptic syndromes with complex partial seizures, without mention of intractable epilepsy    Nasal septal perforation     Other and unspecified hyperlipidemia    Seizures ([place])    Thyroid disease    Past Surgical History:  Procedure Laterality Date   CHOLECYSTECTOMY  04/28/2013   Procedure: LAPAROSCOPIC CHOLECYSTECTOMY;  Surgeon: Ralene Ok, MD;  Location: [facility] OR;  Service: General;;   LEFT SHOULDER SURGERY  1996   Family History  Problem Relation Age of Onset   Kidney failure Father        died age 57   Kidney disease Father    Heart disease Father        bypass surgery   Colon cancer Mother    Alzheimer's disease Mother    Hyperlipidemia Mother    Heart failure Sister        CHF   Heart disease Sister    Peripheral vascular disease Sister    Rheum arthritis Sister    Pancreatitis Sister    Pneumonia Sister    Hyperlipidemia Son    Alzheimer's disease Maternal Grandmother    Peripheral vascular disease Sister    Anxiety disorder Son    Social History   Socioeconomic History   Marital status: Married    Spouse name: Bethena Roys    Number of children: 2   Years of education: 12   Highest education level: Some college, no degree  Occupational History   Occupation: Retired    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     Children live nearby, talk on the phone daily, but only get together for holidays   Patient hard of hearing - wears hearing aids - mild cognitive impairment   Social Determinants of Health   Financial Resource Strain: Low Risk  (02/27/2022)   Overall Financial Resource Strain (CARDIA)    Difficulty of Paying Living Expenses: Not hard at all  Food Insecurity: No Food Insecurity (02/27/2022)   Hunger Vital Sign    Worried About Running Out of Food in the Last Year: Never true     [place] in the Last Year: Never true  Transportation Needs: No Transportation Needs (02/27/2022)   PRAPARE - Hydrologist (Medical): No    Lack of Transportation (Non-Medical): No  Physical Activity: Sufficiently Active (02/27/2022)   Exercise Vital Sign    Days of Exercise per Week: 7 days    Minutes of Exercise per  Session: 30 min  Stress: No Stress Concern Present (02/27/2022)   [place]    Feeling of Stress : Not at all  Social Connections: [place] (02/27/2022)   Social Connection and Isolation Panel [NHANES]    Frequency of Communication with Friends and Family: More than three times a week    Frequency of Social Gatherings with Friends and Family: More than three times a week    Attends Religious Services: More than 4 times per year    Active Member of [employer] or Organizations: Yes    Attends Music therapist: More than 4 times per year    Marital Status: Married    Tobacco Counseling Counseling given: Not Answered   Clinical Intake:  Pre-visit preparation completed: Yes  Pain : No/denies pain     BMI - recorded: 25.75 Nutritional Status: BMI 25 -29 Overweight Nutritional Risks: None Diabetes: No  How often do you need to have someone help you when you read instructions, pamphlets, or other written materials from your doctor or pharmacy?: 1 - Never  Diabetic? no  Interpreter Needed?: No  Information entered by :: Gustin Zobrist, LPN   Activities of Daily Living    02/27/2022   11:20 AM  In your present state of health, do you have any difficulty performing the following activities:  Hearing? 1  Comment wears hearing aids  Vision? 0  Difficulty concentrating or making decisions? 1  Comment mild memory concerns  Walking or climbing stairs? 0  Dressing or bathing? 0  Doing errands, shopping? 1  Comment wife drives; he has epilepsy -  no Forensic scientist and eating ? N  Using the Toilet? N  In the past six months, have you accidently leaked urine? N  Do you have problems with loss of bowel control? N  Managing your Medications? Y  Managing your Finances? Y  Housekeeping or managing your Housekeeping? Y    Patient Care Team: Dettinger, Fransisca Kaufmann, MD as PCP - General (Family Medicine) Katy Apo, MD as Consulting Physician (Ophthalmology) Phillips Odor, MD as Consulting Physician (Neurology) Minus Breeding, MD as Consulting Physician (Cardiology) Rogene Houston, MD as Consulting Physician (Gastroenterology) Hennie Duos, MD as Consulting Physician (Rheumatology) Tanda Rockers, MD as Consulting Physician (Pulmonary Disease)  Indicate any recent Medical Services you may have received from other than [facility] providers in the past year (date may be approximate).     Assessment:   This is a routine wellness examination for Shaune.  Hearing/Vision screen Hearing Screening - Comments:: Wears hearing aids -from [facility] Vision Screening - Comments:: Wears rx glasses - up to date with routine eye exams with [facility]  Dietary issues and exercise activities discussed: Current Exercise Habits: Home exercise routine, Type of exercise: walking;stretching;calisthenics (exercise video every day), Time (Minutes): 30, Frequency (Times/Week): 7, Weekly Exercise (Minutes/Week): 210, Intensity: Mild, Exercise limited by: neurologic condition(s);orthopedic condition(s)   Goals Addressed             This Visit's Progress    Increase physical activity   On track    Stay active      Prevent falls   On track    Stay active       Depression Screen    02/27/2022   11:18 AM 02/18/2022   10:26 AM 08/22/2021   10:27 AM 02/26/2021   11:05 AM 02/19/2021   10:21 AM 08/23/2020   10:41 AM 03/21/2020  9:55 AM  PHQ 2/9 Scores  PHQ - 2 Score 0 0 0 0 0 0 0  PHQ- 9 Score   1        Fall Risk    02/27/2022   11:13 AM  02/18/2022   10:26 AM 08/22/2021   10:27 AM 02/26/2021   11:22 AM 02/19/2021   10:21 AM  Fall Risk   Falls in the past year? 0 0 0 1 0  Number falls in past yr: 0   0   Injury with Fall? 0   1   Risk for fall due to : Other (Comment)   History of fall(s);Impaired balance/gait;Orthopedic patient;Impaired vision;Other (Comment)   Risk for fall due to: Comment Epilepsy, cerebellar gait   seizures   Follow up Falls prevention discussed   Education provided;Falls prevention discussed     FALL RISK PREVENTION PERTAINING TO THE HOME:  Any stairs in or around the home? Yes  If so, are there any without handrails? No  Home free of loose throw rugs in walkways, pet beds, electrical cords, etc? Yes  Adequate lighting in your home to reduce risk of falls? Yes   ASSISTIVE DEVICES UTILIZED TO PREVENT FALLS:  Life alert? No  Use of a cane, walker or w/c? No  Grab bars in the bathroom? Yes  Shower chair or bench in shower? Yes  Elevated toilet seat or a handicapped toilet? No   TIMED UP AND GO:  Was the test performed? No . Telephonic visit  Cognitive Function:    10/29/2017    3:11 PM  MMSE - Mini Mental State Exam  Orientation to time 5  Orientation to Place 5  Registration 3  Attention/ Calculation 5  Recall 1  Language- name 2 objects 2  Language- repeat 1  Language- follow 3 step command 3  Language- read & follow direction 1  Write a sentence 1  Copy design 1  Total score 28        02/27/2022   11:20 AM 02/26/2021   11:08 AM 05/19/2019    1:24 PM  6CIT Screen  What Year? 0 points 0 points 0 points  What month? 0 points 3 points 0 points  What time? 0 points 0 points 0 points  Count back from 20 0 points 0 points 0 points  Months in reverse 0 points 4 points 0 points  Repeat phrase 6 points 6 points 2 points  Total Score 6 points 13 points 2 points    Immunizations Immunization History  Administered Date(s) Administered   Fluad Quad(high Dose 65+) 04/27/2019,  05/09/2020, 05/01/2021   Influenza Split 04/14/2013   Influenza Whole 04/14/2010   Influenza, High Dose Seasonal PF 05/07/2016, 05/07/2017, 04/27/2018   Influenza,inj,Quad PF,6+ Mos 05/04/2014, 04/19/2015   Moderna Sars-Covid-2 Vaccination 08/20/2019, 09/18/2019, 05/22/2020, 11/23/2020   Pneumococcal Conjugate-13 08/23/2013   Pneumococcal Polysaccharide-23 12/19/2010   Td 05/15/2008   Tdap 06/01/2018   Zoster Recombinat (Shingrix) 08/22/2021, 02/18/2022   Zoster, Live 05/15/2008    TDAP status: Up to date  Flu Vaccine status: Up to date  Pneumococcal vaccine status: Up to date  Covid-19 vaccine status: Completed vaccines  Qualifies for Shingles Vaccine? Yes   Zostavax completed Yes   Shingrix Completed?: Yes  Screening Tests Health Maintenance  Topic Date Due   COVID-19 Vaccine (5 - Moderna risk series) 01/18/2021   INFLUENZA VACCINE  02/12/2022   COLONOSCOPY (Pts 45-76yr Insurance coverage will need to be confirmed)  02/19/2023 (Originally 05/11/2019)  TETANUS/TDAP  06/01/2028   Pneumonia Vaccine 75+ Years old  Completed   Hepatitis C Screening  Completed   Zoster Vaccines- Shingrix  Completed   HPV VACCINES  Aged Out    Health Maintenance  Health Maintenance Due  Topic Date Due   COVID-19 Vaccine (5 - Moderna risk series) 01/18/2021   INFLUENZA VACCINE  02/12/2022    Colorectal cancer screening: No longer required.  He declines  Lung Cancer Screening: (Low Dose CT Chest recommended if Age 68-80 years, 30 pack-year currently smoking OR have quit w/in 15years.) does not qualify.  Additional Screening:  Hepatitis C Screening: does qualify; Completed 12/26/2016  Vision Screening: Recommended annual ophthalmology exams for early detection of glaucoma and other disorders of the eye. Is the patient up to date with their annual eye exam?  Yes  Who is the provider or what is the name of the office in which the patient attends annual eye exams? [facility] If pt is not  established with a provider, would they like to be referred to a provider to establish care? No .   Dental Screening: Recommended annual dental exams for proper oral hygiene  Community Resource Referral / Chronic Care Management: CRR required this visit?  No   CCM required this visit?  No      Plan:     I have personally reviewed and noted the following in the patient's chart:   Medical and social history Use of alcohol, tobacco or illicit drugs  Current medications and supplements including opioid prescriptions. Patient is not currently taking opioid prescriptions. Functional ability and status Nutritional status Physical activity Advanced directives List of other physicians Hospitalizations, surgeries, and ER visits in previous 12 months Vitals Screenings to include cognitive, depression, and falls Referrals and appointments  In addition, I have reviewed and discussed with patient certain preventive protocols, quality metrics, and best practice recommendations. A written personalized care plan for preventive services as well as general preventive health recommendations were provided to patient.     Sandrea Hammond, LPN   0/98/1191   Nurse Notes: None

## 2022-02-28 NOTE — Telephone Encounter (Signed)
Spoke with patient's wife and explained this to her.  She will have patient contact his GI doctor.

## 2022-05-01 ENCOUNTER — Ambulatory Visit (INDEPENDENT_AMBULATORY_CARE_PROVIDER_SITE_OTHER): Payer: Medicare PPO

## 2022-05-01 DIAGNOSIS — Z23 Encounter for immunization: Secondary | ICD-10-CM

## 2022-05-02 DIAGNOSIS — E663 Overweight: Secondary | ICD-10-CM | POA: Diagnosis not present

## 2022-05-02 DIAGNOSIS — M1991 Primary osteoarthritis, unspecified site: Secondary | ICD-10-CM | POA: Diagnosis not present

## 2022-05-02 DIAGNOSIS — M0589 Other rheumatoid arthritis with rheumatoid factor of multiple sites: Secondary | ICD-10-CM | POA: Diagnosis not present

## 2022-05-02 DIAGNOSIS — Z6825 Body mass index (BMI) 25.0-25.9, adult: Secondary | ICD-10-CM | POA: Diagnosis not present

## 2022-05-02 DIAGNOSIS — Z79899 Other long term (current) drug therapy: Secondary | ICD-10-CM | POA: Diagnosis not present

## 2022-05-05 ENCOUNTER — Other Ambulatory Visit: Payer: Self-pay | Admitting: Family Medicine

## 2022-07-16 DIAGNOSIS — R509 Fever, unspecified: Secondary | ICD-10-CM | POA: Diagnosis not present

## 2022-07-16 DIAGNOSIS — R059 Cough, unspecified: Secondary | ICD-10-CM | POA: Diagnosis not present

## 2022-07-30 DIAGNOSIS — G629 Polyneuropathy, unspecified: Secondary | ICD-10-CM | POA: Diagnosis not present

## 2022-07-30 DIAGNOSIS — R292 Abnormal reflex: Secondary | ICD-10-CM | POA: Diagnosis not present

## 2022-07-30 DIAGNOSIS — G40219 Localization-related (focal) (partial) symptomatic epilepsy and epileptic syndromes with complex partial seizures, intractable, without status epilepticus: Secondary | ICD-10-CM | POA: Diagnosis not present

## 2022-07-30 DIAGNOSIS — R2689 Other abnormalities of gait and mobility: Secondary | ICD-10-CM | POA: Diagnosis not present

## 2022-08-01 DIAGNOSIS — M0589 Other rheumatoid arthritis with rheumatoid factor of multiple sites: Secondary | ICD-10-CM | POA: Diagnosis not present

## 2022-08-12 DIAGNOSIS — G992 Myelopathy in diseases classified elsewhere: Secondary | ICD-10-CM | POA: Diagnosis not present

## 2022-08-12 DIAGNOSIS — H748X2 Other specified disorders of left middle ear and mastoid: Secondary | ICD-10-CM | POA: Diagnosis not present

## 2022-08-12 DIAGNOSIS — G319 Degenerative disease of nervous system, unspecified: Secondary | ICD-10-CM | POA: Diagnosis not present

## 2022-08-12 DIAGNOSIS — Q7649 Other congenital malformations of spine, not associated with scoliosis: Secondary | ICD-10-CM | POA: Diagnosis not present

## 2022-08-12 DIAGNOSIS — M4802 Spinal stenosis, cervical region: Secondary | ICD-10-CM | POA: Diagnosis not present

## 2022-08-12 DIAGNOSIS — G40209 Localization-related (focal) (partial) symptomatic epilepsy and epileptic syndromes with complex partial seizures, not intractable, without status epilepticus: Secondary | ICD-10-CM | POA: Diagnosis not present

## 2022-08-21 ENCOUNTER — Encounter: Payer: Self-pay | Admitting: Family Medicine

## 2022-08-21 ENCOUNTER — Ambulatory Visit: Payer: Medicare PPO | Admitting: Family Medicine

## 2022-08-21 VITALS — BP 117/70 | HR 64 | Ht 64.0 in | Wt 154.0 lb

## 2022-08-21 DIAGNOSIS — F339 Major depressive disorder, recurrent, unspecified: Secondary | ICD-10-CM

## 2022-08-21 DIAGNOSIS — E039 Hypothyroidism, unspecified: Secondary | ICD-10-CM | POA: Diagnosis not present

## 2022-08-21 DIAGNOSIS — E782 Mixed hyperlipidemia: Secondary | ICD-10-CM

## 2022-08-21 DIAGNOSIS — G629 Polyneuropathy, unspecified: Secondary | ICD-10-CM

## 2022-08-21 MED ORDER — ESCITALOPRAM OXALATE 20 MG PO TABS: 20.0000 mg | ORAL_TABLET | Freq: Every day | ORAL | 1 refills | Status: DC

## 2022-08-21 NOTE — Progress Notes (Signed)
BP 117/70   Pulse 64   Ht '5\' 4"'$  (1.626 m)   Wt 154 lb (69.9 kg)   SpO2 99%   BMI 26.43 kg/m    Subjective:   Patient ID: Troy Genre., male    DOB: 1946/07/06, 77 y.o.   MRN: [id number]  HPI: Troy Mathes. is a 77 y.o. male presenting on 08/21/2022 for Medical Management of Chronic Issues, Hyperlipidemia, and Hypothyroidism   HPI Hyperlipidemia Patient is coming in for recheck of his hyperlipidemia. The patient is currently taking atorvastatin and Zetia and fish oil. They deny any issues with myalgias or history of liver damage from it. They deny any focal numbness or weakness or chest pain.   Hypothyroidism recheck Patient is coming in for thyroid recheck today as well. They deny any issues with hair changes or heat or cold problems or diarrhea or constipation. They deny any chest pain or palpitations. They are currently on levothyroxine 50 micrograms   Depression recheck Patient is coming in for depression recheck.  Currently he is taking Lexapro and seems to be stable on it.    08/21/2022   10:01 AM 08/21/2022   10:00 AM 02/27/2022   11:18 AM 02/18/2022   10:26 AM 08/22/2021   10:27 AM  Depression screen PHQ 2/9  Decreased Interest  0 0 0 0  Down, Depressed, Hopeless  0 0 0 0  PHQ - 2 Score  0 0 0 0  Altered sleeping 0    0  Tired, decreased energy 0    0  Change in appetite 0    0  Feeling bad or failure about yourself  0    0  Trouble concentrating 0    0  Moving slowly or fidgety/restless 0    1  Suicidal thoughts 0    0  PHQ-9 Score     1  Difficult doing work/chores Not difficult at all         Relevant past medical, surgical, family and social history reviewed and updated as indicated. Interim medical history since our last visit reviewed. Allergies and medications reviewed and updated.  Review of Systems  Constitutional:  Negative for chills and fever.  Eyes:  Negative for visual disturbance.  Respiratory:  Negative for shortness of breath and wheezing.    Cardiovascular:  Negative for chest pain and leg swelling.  Musculoskeletal:  Positive for arthralgias and gait problem. Negative for back pain.  Skin:  Negative for rash.  Neurological:  Positive for numbness. Negative for dizziness, weakness and light-headedness.  All other systems reviewed and are negative.   Per HPI unless specifically indicated above   Allergies as of 08/21/2022       Reactions   Fentanyl Other (See Comments)   In high doses of 100 mcg pt's O2 sats decrease to 60% RA   Hydrocodone    Intolerance         Medication List        Accurate as of August 21, 2022 10:22 AM. If you have any questions, ask your nurse or doctor.          STOP taking these medications    guaiFENesin 600 MG 12 hr tablet Commonly known as: [place] Stopped by: Fransisca Kaufmann Shar Paez, MD       TAKE these medications    aspirin EC 81 MG tablet Take 81 mg by mouth daily.   atorvastatin 40 MG tablet Commonly known as: LIPITOR Take 1 tablet (  40 mg total) by mouth daily.   escitalopram 20 MG tablet Commonly known as: LEXAPRO Take 1 tablet (20 mg total) by mouth daily.   ezetimibe 10 MG tablet Commonly known as: ZETIA Take 1 tablet (10 mg total) by mouth daily.   folic acid 1 MG tablet Commonly known as: FOLVITE Take 1 mg by mouth 2 (two) times daily.   lacosamide 200 MG Tabs tablet Commonly known as: VIMPAT Take 200 mg by mouth 2 (two) times daily.   levothyroxine 50 MCG tablet Commonly known as: SYNTHROID Take 1 tablet (50 mcg total) by mouth daily.   methotrexate 2.5 MG tablet Commonly known as: RHEUMATREX Take 10 mg by mouth once a week.   omega-3 fish oil 1000 MG Caps capsule Commonly known as: MAXEPA Take 2 capsules by mouth daily.   omeprazole 20 MG capsule Commonly known as: PRILOSEC Take 1 capsule (20 mg total) by mouth daily.   primidone 250 MG tablet Commonly known as: MYSOLINE 250 mg. 1 in the am and 1 at bedtime   Vitamin D3 10 MCG (400  UNIT) Caps Take 800 Units by mouth.         Objective:   BP 117/70   Pulse 64   Ht '5\' 4"'$  (1.626 m)   Wt 154 lb (69.9 kg)   SpO2 99%   BMI 26.43 kg/m   Wt Readings from Last 3 Encounters:  08/21/22 154 lb (69.9 kg)  02/27/22 150 lb (68 kg)  02/18/22 150 lb (68 kg)    Physical Exam Vitals and nursing note reviewed.  Constitutional:      General: He is not in acute distress.    Appearance: He is well-developed. He is not diaphoretic.  Eyes:     General: No scleral icterus.    Conjunctiva/sclera: Conjunctivae normal.  Neck:     Thyroid: No thyromegaly.  Cardiovascular:     Rate and Rhythm: Normal rate and regular rhythm.     Heart sounds: Normal heart sounds. No murmur heard. Pulmonary:     Effort: Pulmonary effort is normal. No respiratory distress.     Breath sounds: Normal breath sounds. No wheezing.  Musculoskeletal:        General: No swelling. Normal range of motion.     Cervical back: Neck supple.  Lymphadenopathy:     Cervical: No cervical adenopathy.  Skin:    General: Skin is warm and dry.     Findings: No rash.  Neurological:     Mental Status: He is alert and oriented to person, place, and time.     Coordination: Coordination normal.  Psychiatric:        Behavior: Behavior normal.       Assessment & Plan:   Problem List Items Addressed This Visit       Endocrine   Hypothyroidism   Relevant Orders   CBC with Differential/Platelet   CMP14+EGFR   Lipid panel   TSH     Other   Hyperlipidemia - Primary   Relevant Orders   CBC with Differential/Platelet   CMP14+EGFR   Lipid panel   TSH   Depression, recurrent (HCC)   Relevant Medications   escitalopram (LEXAPRO) 20 MG tablet   Other Relevant Orders   CBC with Differential/Platelet   CMP14+EGFR   Lipid panel   TSH   Other Visit Diagnoses     Neuropathy       Relevant Orders   Vitamin B12     Will check blood work  today.  Continue to follow-up with his neurology and  rheumatology and will follow along with that stuff.  Will test his vitamin B12 as well because of his neuropathy  Follow up plan: Return in about 6 months (around 02/19/2023), or if symptoms worsen or fail to improve, for Physical and thyroid and hyperlipidemia and depression recheck.  Counseling provided for all of the vaccine components Orders Placed This Encounter  Procedures   CBC with Differential/Platelet   CMP14+EGFR   Lipid panel   TSH   Vitamin B12    Caryl Pina, MD [facility] 08/21/2022, 10:22 AM

## 2022-08-22 LAB — CMP14+EGFR
ALT: 41 IU/L (ref 0–44)
AST: 51 IU/L — ABNORMAL HIGH (ref 0–40)
Albumin/Globulin Ratio: 1.6 (ref 1.2–2.2)
Albumin: 4.2 g/dL (ref 3.8–4.8)
Alkaline Phosphatase: 90 IU/L (ref 44–121)
BUN/Creatinine Ratio: 13 (ref 10–24)
BUN: 14 mg/dL (ref 8–27)
Bilirubin Total: 0.2 mg/dL (ref 0.0–1.2)
CO2: 24 mmol/L (ref 20–29)
Calcium: 9 mg/dL (ref 8.6–10.2)
Chloride: 99 mmol/L (ref 96–106)
Creatinine, Ser: 1.12 mg/dL (ref 0.76–1.27)
Globulin, Total: 2.6 g/dL (ref 1.5–4.5)
Glucose: 89 mg/dL (ref 70–99)
Potassium: 5.1 mmol/L (ref 3.5–5.2)
Sodium: 136 mmol/L (ref 134–144)
Total Protein: 6.8 g/dL (ref 6.0–8.5)
eGFR: 68 mL/min/{1.73_m2} (ref >59)

## 2022-08-22 LAB — LIPID PANEL
Chol/HDL Ratio: 2.4 ratio (ref 0.0–5.0)
Cholesterol, Total: 150 mg/dL (ref 100–199)
HDL: 62 mg/dL (ref >39)
LDL Chol Calc (NIH): 68 mg/dL (ref 0–99)
Triglycerides: 109 mg/dL (ref 0–149)
VLDL Cholesterol Cal: 20 mg/dL (ref 5–40)

## 2022-08-22 LAB — CBC WITH DIFFERENTIAL/PLATELET
Basophils Absolute: 0.1 10*3/uL (ref 0.0–0.2)
Basos: 1 %
EOS (ABSOLUTE): 0.1 10*3/uL (ref 0.0–0.4)
Eos: 2 %
Hematocrit: 35.9 % — ABNORMAL LOW (ref 37.5–51.0)
Hemoglobin: 12.7 g/dL — ABNORMAL LOW (ref 13.0–17.7)
Immature Grans (Abs): 0 10*3/uL (ref 0.0–0.1)
Immature Granulocytes: 0 %
Lymphocytes Absolute: 1.9 10*3/uL (ref 0.7–3.1)
Lymphs: 23 %
MCH: 34.9 pg — ABNORMAL HIGH (ref 26.6–33.0)
MCHC: 35.4 g/dL (ref 31.5–35.7)
MCV: 99 fL — ABNORMAL HIGH (ref 79–97)
Monocytes Absolute: 0.7 10*3/uL (ref 0.1–0.9)
Monocytes: 9 %
Neutrophils Absolute: 5.3 10*3/uL (ref 1.4–7.0)
Neutrophils: 65 %
Platelets: 280 10*3/uL (ref 150–450)
RBC: 3.64 x10E6/uL — ABNORMAL LOW (ref 4.14–5.80)
RDW: 12.4 % (ref 11.6–15.4)
WBC: 8.1 10*3/uL (ref 3.4–10.8)

## 2022-08-22 LAB — VITAMIN B12: Vitamin B-12: 318 pg/mL (ref 232–1245)

## 2022-08-22 LAB — TSH: TSH: 3.54 u[IU]/mL (ref 0.450–4.500)

## 2022-09-12 DIAGNOSIS — R292 Abnormal reflex: Secondary | ICD-10-CM | POA: Diagnosis not present

## 2022-09-12 DIAGNOSIS — R2689 Other abnormalities of gait and mobility: Secondary | ICD-10-CM | POA: Diagnosis not present

## 2022-09-12 DIAGNOSIS — G40219 Localization-related (focal) (partial) symptomatic epilepsy and epileptic syndromes with complex partial seizures, intractable, without status epilepticus: Secondary | ICD-10-CM | POA: Diagnosis not present

## 2022-09-12 DIAGNOSIS — G629 Polyneuropathy, unspecified: Secondary | ICD-10-CM | POA: Diagnosis not present

## 2022-10-03 DIAGNOSIS — M47812 Spondylosis without myelopathy or radiculopathy, cervical region: Secondary | ICD-10-CM | POA: Diagnosis not present

## 2022-10-03 DIAGNOSIS — Z6825 Body mass index (BMI) 25.0-25.9, adult: Secondary | ICD-10-CM | POA: Diagnosis not present

## 2022-10-28 DIAGNOSIS — Z6825 Body mass index (BMI) 25.0-25.9, adult: Secondary | ICD-10-CM | POA: Diagnosis not present

## 2022-10-28 DIAGNOSIS — E663 Overweight: Secondary | ICD-10-CM | POA: Diagnosis not present

## 2022-10-28 DIAGNOSIS — Z79899 Other long term (current) drug therapy: Secondary | ICD-10-CM | POA: Diagnosis not present

## 2022-10-28 DIAGNOSIS — M0589 Other rheumatoid arthritis with rheumatoid factor of multiple sites: Secondary | ICD-10-CM | POA: Diagnosis not present

## 2022-10-28 DIAGNOSIS — M1991 Primary osteoarthritis, unspecified site: Secondary | ICD-10-CM | POA: Diagnosis not present

## 2022-11-25 ENCOUNTER — Telehealth: Payer: Self-pay | Admitting: Family Medicine

## 2022-11-25 NOTE — Telephone Encounter (Signed)
Contacted Clevon P Dashell Deleone. to schedule their annual wellness visit. Appointment made for 03/03/2023.  Thank you,  Judeth Cornfield,  AMB Clinical Support [facility] AW[facility] Program Direct Dial ??[phone]

## 2023-01-22 DIAGNOSIS — H5203 Hypermetropia, bilateral: Secondary | ICD-10-CM | POA: Diagnosis not present

## 2023-01-22 DIAGNOSIS — H2513 Age-related nuclear cataract, bilateral: Secondary | ICD-10-CM | POA: Diagnosis not present

## 2023-01-22 DIAGNOSIS — H524 Presbyopia: Secondary | ICD-10-CM | POA: Diagnosis not present

## 2023-01-28 DIAGNOSIS — M0589 Other rheumatoid arthritis with rheumatoid factor of multiple sites: Secondary | ICD-10-CM | POA: Diagnosis not present

## 2023-02-19 ENCOUNTER — Ambulatory Visit (INDEPENDENT_AMBULATORY_CARE_PROVIDER_SITE_OTHER): Payer: Medicare PPO | Admitting: Family Medicine

## 2023-02-19 ENCOUNTER — Encounter: Payer: Self-pay | Admitting: Family Medicine

## 2023-02-19 VITALS — BP 119/56 | HR 62 | Ht 64.0 in | Wt 149.0 lb

## 2023-02-19 DIAGNOSIS — F339 Major depressive disorder, recurrent, unspecified: Secondary | ICD-10-CM

## 2023-02-19 DIAGNOSIS — E039 Hypothyroidism, unspecified: Secondary | ICD-10-CM | POA: Diagnosis not present

## 2023-02-19 DIAGNOSIS — Z0001 Encounter for general adult medical examination with abnormal findings: Secondary | ICD-10-CM

## 2023-02-19 DIAGNOSIS — E782 Mixed hyperlipidemia: Secondary | ICD-10-CM

## 2023-02-19 DIAGNOSIS — K219 Gastro-esophageal reflux disease without esophagitis: Secondary | ICD-10-CM | POA: Diagnosis not present

## 2023-02-19 DIAGNOSIS — N400 Enlarged prostate without lower urinary tract symptoms: Secondary | ICD-10-CM

## 2023-02-19 DIAGNOSIS — Z0000 Encounter for general adult medical examination without abnormal findings: Secondary | ICD-10-CM

## 2023-02-19 MED ORDER — OMEPRAZOLE 20 MG PO CPDR
20.0000 mg | DELAYED_RELEASE_CAPSULE | Freq: Every day | ORAL | 3 refills | Status: DC
Start: 2023-02-19 — End: 2023-12-09

## 2023-02-19 MED ORDER — ATORVASTATIN CALCIUM 40 MG PO TABS
40.0000 mg | ORAL_TABLET | Freq: Every day | ORAL | 3 refills | Status: DC
Start: 2023-02-19 — End: 2024-02-19

## 2023-02-19 MED ORDER — EZETIMIBE 10 MG PO TABS
10.0000 mg | ORAL_TABLET | Freq: Every day | ORAL | 3 refills | Status: DC
Start: 2023-02-19 — End: 2024-02-19

## 2023-02-19 MED ORDER — ESCITALOPRAM OXALATE 20 MG PO TABS
20.0000 mg | ORAL_TABLET | Freq: Every day | ORAL | 3 refills | Status: DC
Start: 2023-02-19 — End: 2024-02-19

## 2023-02-19 MED ORDER — LEVOTHYROXINE SODIUM 50 MCG PO TABS
50.0000 ug | ORAL_TABLET | Freq: Every day | ORAL | 3 refills | Status: DC
Start: 2023-02-19 — End: 2024-02-19

## 2023-02-19 NOTE — Progress Notes (Signed)
BP (!) 119/56   Pulse 62   Ht 5\' 4"  (1.626 m)   Wt 149 lb (67.6 kg)   SpO2 97%   BMI 25.58 kg/m    Subjective:   Patient ID: Troy Gong., male    DOB: Mar 20, 1946, 77 y.o.   MRN: [id number]  HPI: Troy Winkeler. is a 77 y.o. male presenting on 02/19/2023 for Medical Management of Chronic Issues (CPE), Hypothyroidism, and Hyperlipidemia   HPI Physical exam and recheck of chronic medical issues Patient comes in today for physical exam recheck medical complications.  He says he is doing well for the most part for he is having some left lower extremity weakness it is related to a nerve impingement and he says he just feels fatigued and not like tired so much more quickly.  They are seeing neurology for this and want to go see them but are considering doing physical therapy to help strengthen.  Hyperlipidemia Patient is coming in for recheck of his hyperlipidemia. The patient is currently taking atorvastatin and Zetia and fish oils. They deny any issues with myalgias or history of liver damage from it. They deny any focal numbness or weakness or chest pain.   Hypothyroidism recheck Patient is coming in for thyroid recheck today as well. They deny any issues with hair changes or heat or cold problems or diarrhea or constipation. They deny any chest pain or palpitations. They are currently on levothyroxine 50 micrograms   GERD Patient is currently on omeprazole.  She denies any major symptoms or abdominal pain or belching or burping. She denies any blood in her stool or lightheadedness or dizziness.   Depression recheck  patient is taking Lexapro for her depression and seems to be stable on it.  Her his family was here with him doing well and not having any major mood disorder issues.    02/19/2023   10:21 AM 02/19/2023   10:20 AM 08/21/2022   10:01 AM 08/21/2022   10:00 AM 02/27/2022   11:18 AM  Depression screen PHQ 2/9  Decreased Interest  0  0 0  Down, Depressed, Hopeless  0  0 0   PHQ - 2 Score  0  0 0  Altered sleeping 0  0    Tired, decreased energy 0  0    Change in appetite 0  0    Feeling bad or failure about yourself  0  0    Trouble concentrating 0  0    Moving slowly or fidgety/restless 1  0    Suicidal thoughts 0  0    Difficult doing work/chores Not difficult at all  Not difficult at all       Relevant past medical, surgical, family and social history reviewed and updated as indicated. Interim medical history since our last visit reviewed. Allergies and medications reviewed and updated.  Review of Systems  Constitutional:  Negative for chills and fever.  HENT:  Negative for ear pain and tinnitus.   Eyes:  Negative for pain and visual disturbance.  Respiratory:  Negative for cough, shortness of breath and wheezing.   Cardiovascular:  Negative for chest pain, palpitations and leg swelling.  Gastrointestinal:  Negative for abdominal pain, blood in stool, constipation and diarrhea.  Genitourinary:  Negative for dysuria and hematuria.  Musculoskeletal:  Negative for back pain, gait problem and myalgias.  Skin:  Negative for rash.  Neurological:  Negative for dizziness, weakness, light-headedness and headaches.  Psychiatric/Behavioral:  Negative  for suicidal ideas.   All other systems reviewed and are negative.   Per HPI unless specifically indicated above   Allergies as of 02/19/2023       Reactions   Fentanyl Other (See Comments)   In high doses of 100 mcg pt's O2 sats decrease to 60% RA   Hydrocodone    Intolerance         Medication List        Accurate as of February 19, 2023 10:50 AM. If you have any questions, ask your nurse or doctor.          aspirin EC 81 MG tablet Take 81 mg by mouth daily.   atorvastatin 40 MG tablet Commonly known as: LIPITOR Take 1 tablet (40 mg total) by mouth daily.   escitalopram 20 MG tablet Commonly known as: LEXAPRO Take 1 tablet (20 mg total) by mouth daily.   ezetimibe 10 MG  tablet Commonly known as: ZETIA Take 1 tablet (10 mg total) by mouth daily.   folic acid 1 MG tablet Commonly known as: FOLVITE Take 1 mg by mouth 2 (two) times daily.   lacosamide 200 MG Tabs tablet Commonly known as: VIMPAT Take 200 mg by mouth 2 (two) times daily.   levothyroxine 50 MCG tablet Commonly known as: SYNTHROID Take 1 tablet (50 mcg total) by mouth daily.   methotrexate 2.5 MG tablet Commonly known as: RHEUMATREX Take 10 mg by mouth once a week.   omega-3 fish oil 1000 MG Caps capsule Commonly known as: MAXEPA Take 2 capsules by mouth daily.   omeprazole 20 MG capsule Commonly known as: PRILOSEC Take 1 capsule (20 mg total) by mouth daily.   primidone 250 MG tablet Commonly known as: MYSOLINE 250 mg. 1 in the am and 1 at bedtime   Vitamin D3 10 MCG (400 UNIT) Caps Take 800 Units by mouth.         Objective:   BP (!) 119/56   Pulse 62   Ht 5\' 4"  (1.626 m)   Wt 149 lb (67.6 kg)   SpO2 97%   BMI 25.58 kg/m   Wt Readings from Last 3 Encounters:  02/19/23 149 lb (67.6 kg)  08/21/22 154 lb (69.9 kg)  02/27/22 150 lb (68 kg)    Physical Exam Vitals reviewed.  Constitutional:      General: He is not in acute distress.    Appearance: He is well-developed. He is not diaphoretic.  HENT:     Right Ear: External ear normal.     Left Ear: External ear normal.     Nose: Nose normal.     Mouth/Throat:     Pharynx: No oropharyngeal exudate.  Eyes:     General: No scleral icterus.       Right eye: No discharge.     Conjunctiva/sclera: Conjunctivae normal.     Pupils: Pupils are equal, round, and reactive to light.  Neck:     Thyroid: No thyromegaly.  Cardiovascular:     Rate and Rhythm: Normal rate and regular rhythm.     Heart sounds: Normal heart sounds. No murmur heard. Pulmonary:     Effort: Pulmonary effort is normal. No respiratory distress.     Breath sounds: Normal breath sounds. No wheezing.  Abdominal:     General: Bowel sounds are  normal. There is no distension.     Palpations: Abdomen is soft.     Tenderness: There is no abdominal tenderness. There is no guarding or  rebound.  Musculoskeletal:        General: No swelling. Normal range of motion.     Cervical back: Neck supple.  Lymphadenopathy:     Cervical: No cervical adenopathy.  Skin:    General: Skin is warm and dry.     Findings: No rash.  Neurological:     Mental Status: He is alert and oriented to person, place, and time.     Coordination: Coordination normal.  Psychiatric:        Behavior: Behavior normal.       Assessment & Plan:   Problem List Items Addressed This Visit       Digestive   GERD (gastroesophageal reflux disease)   Relevant Medications   omeprazole (PRILOSEC) 20 MG capsule     Endocrine   Hypothyroidism   Relevant Medications   levothyroxine (SYNTHROID) 50 MCG tablet   Other Relevant Orders   TSH     Genitourinary   BPH (benign prostatic hyperplasia)   Relevant Orders   PSA, total and free     Other   Hyperlipidemia   Relevant Medications   atorvastatin (LIPITOR) 40 MG tablet   ezetimibe (ZETIA) 10 MG tablet   Other Relevant Orders   CMP14+EGFR   Lipid panel   Depression, recurrent (HCC)   Relevant Medications   escitalopram (LEXAPRO) 20 MG tablet   Other Relevant Orders   CBC with Differential/Platelet   Other Visit Diagnoses     Physical exam    -  Primary   Relevant Orders   CBC with Differential/Platelet   CMP14+EGFR   Lipid panel   TSH     Seems to be doing well minus left lower extremity weakness related to the neuropathy.  Recommend some stretching exercises and will consider physical therapy in the future.  He is still seeing neurology for this.  Continue current medicine and will check blood work today.  Follow up plan: Return in about 6 months (around 08/22/2023), or if symptoms worsen or fail to improve, for Thyroid and cholesterol recheck.  Counseling provided for all of the vaccine  components Orders Placed This Encounter  Procedures   CBC with Differential/Platelet   CMP14+EGFR   Lipid panel   TSH   PSA, total and free    Troy Care, MD [place] [facility] 02/19/2023, 10:50 AM

## 2023-03-03 ENCOUNTER — Ambulatory Visit (INDEPENDENT_AMBULATORY_CARE_PROVIDER_SITE_OTHER): Payer: Medicare PPO

## 2023-03-03 VITALS — Ht 66.0 in | Wt 150.0 lb

## 2023-03-03 DIAGNOSIS — Z0000 Encounter for general adult medical examination without abnormal findings: Secondary | ICD-10-CM

## 2023-03-03 NOTE — Patient Instructions (Signed)
[name] , Thank you for taking time to come for your Medicare Wellness Visit. I appreciate your ongoing commitment to your health goals. Please review the following plan we discussed and let me know if I can assist you in the future.   Referrals/Orders/Follow-Ups/Clinician Recommendations: Aim for 30 minutes of exercise or brisk walking, 6-8 glasses of water, and 5 servings of fruits and vegetables each day.   This is a list of the screening recommended for you and due dates:  Health Maintenance  Topic Date Due   Colon Cancer Screening  05/11/2019   COVID-19 Vaccine (5 - 2023-24 season) 03/15/2022   Flu Shot  02/13/2023   Medicare Annual Wellness Visit  03/02/2024   DTaP/Tdap/Td vaccine (3 - Td or Tdap) 06/01/2028   Pneumonia Vaccine  Completed   Hepatitis C Screening  Completed   Zoster (Shingles) Vaccine  Completed   HPV Vaccine  Aged Out    Advanced directives: (ACP Link)Information on Advanced Care Planning can be found at [facility] Advance Health Care Directives Advance Health Care Directives ([email or web address] Information on Advanced Care Planning can be found at [facility] Advance Health Care Directives Advance Health Care Directives ([email or web address]    Next Medicare Annual Wellness Visit scheduled for next year: Yes  Preventive Care 65 Years and Older, Male  Preventive care refers to lifestyle choices and visits with your health care provider that can promote health and wellness. What does preventive care include? A yearly physical exam. This is also called an annual well check. Dental exams once or twice a year. Routine eye exams. Ask your health care provider how often you should have your eyes checked. Personal lifestyle choices, including: Daily care of your teeth and gums. Regular physical activity. Eating a healthy diet. Avoiding tobacco and drug use. Limiting alcohol use. Practicing safe sex. Taking low doses of aspirin every  day. Taking vitamin and mineral supplements as recommended by your health care provider. What happens during an annual well check? The services and screenings done by your health care provider during your annual well check will depend on your age, overall health, lifestyle risk factors, and family history of disease. Counseling  Your health care provider may ask you questions about your: Alcohol use. Tobacco use. Drug use. Emotional well-being. Home and relationship well-being. Sexual activity. Eating habits. History of falls. Memory and ability to understand (cognition). Work and work Astronomer. Screening  You may have the following tests or measurements: Height, weight, and BMI. Blood pressure. Lipid and cholesterol levels. These may be checked every 5 years, or more frequently if you are over 8 years old. Skin check. Lung cancer screening. You may have this screening every year starting at age 21 if you have a 30-pack-year history of smoking and currently smoke or have quit within the past 15 years. Fecal occult blood test (FOBT) of the stool. You may have this test every year starting at age 23. Flexible sigmoidoscopy or colonoscopy. You may have a sigmoidoscopy every 5 years or a colonoscopy every 10 years starting at age 78. Prostate cancer screening. Recommendations will vary depending on your family history and other risks. Hepatitis C blood test. Hepatitis B blood test. Sexually transmitted disease (STD) testing. Diabetes screening. This is done by checking your blood sugar (glucose) after you have not eaten for a while (fasting). You may have this done every 1-3 years. Abdominal aortic aneurysm (AAA) screening. You may need this if you are a current  or former smoker. Osteoporosis. You may be screened starting at age 56 if you are at high risk. Talk with your health care provider about your test results, treatment options, and if necessary, the need for more  tests. Vaccines  Your health care provider may recommend certain vaccines, such as: Influenza vaccine. This is recommended every year. Tetanus, diphtheria, and acellular pertussis (Tdap, Td) vaccine. You may need a Td booster every 10 years. Zoster vaccine. You may need this after age 50. Pneumococcal 13-valent conjugate (PCV13) vaccine. One dose is recommended after age 89. Pneumococcal polysaccharide (PPSV23) vaccine. One dose is recommended after age 101. Talk to your health care provider about which screenings and vaccines you need and how often you need them. This information is not intended to replace advice given to you by your health care provider. Make sure you discuss any questions you have with your health care provider. Document Released: 07/28/2015 Document Revised: 03/20/2016 Document Reviewed: 05/02/2015 Elsevier Interactive Patient Education  2017 [employer].  Fall Prevention in the Home Falls can cause injuries. They can happen to people of all ages. There are many things you can do to make your home safe and to help prevent falls. What can I do on the outside of my home? Regularly fix the edges of walkways and driveways and fix any cracks. Remove anything that might make you trip as you walk through a door, such as a raised step or threshold. Trim any bushes or trees on the path to your home. Use bright outdoor lighting. Clear any walking paths of anything that might make someone trip, such as rocks or tools. Regularly check to see if handrails are loose or broken. Make sure that both sides of any steps have handrails. Any raised decks and porches should have guardrails on the edges. Have any leaves, snow, or ice cleared regularly. Use sand or salt on walking paths during winter. Clean up any spills in your garage right away. This includes oil or grease spills. What can I do in the bathroom? Use night lights. Install grab bars by the toilet and in the tub and shower.  Do not use towel bars as grab bars. Use non-skid mats or decals in the tub or shower. If you need to sit down in the shower, use a plastic, non-slip stool. Keep the floor dry. Clean up any water that spills on the floor as soon as it happens. Remove soap buildup in the tub or shower regularly. Attach bath mats securely with double-sided non-slip rug tape. Do not have throw rugs and other things on the floor that can make you trip. What can I do in the bedroom? Use night lights. Make sure that you have a light by your bed that is easy to reach. Do not use any sheets or blankets that are too big for your bed. They should not hang down onto the floor. Have a firm chair that has side arms. You can use this for support while you get dressed. Do not have throw rugs and other things on the floor that can make you trip. What can I do in the kitchen? Clean up any spills right away. Avoid walking on wet floors. Keep items that you use a lot in easy-to-reach places. If you need to reach something above you, use a strong step stool that has a grab bar. Keep electrical cords out of the way. Do not use floor polish or wax that makes floors slippery. If you must use wax,  use non-skid floor wax. Do not have throw rugs and other things on the floor that can make you trip. What can I do with my stairs? Do not leave any items on the stairs. Make sure that there are handrails on both sides of the stairs and use them. Fix handrails that are broken or loose. Make sure that handrails are as long as the stairways. Check any carpeting to make sure that it is firmly attached to the stairs. Fix any carpet that is loose or worn. Avoid having throw rugs at the top or bottom of the stairs. If you do have throw rugs, attach them to the floor with carpet tape. Make sure that you have a light switch at the top of the stairs and the bottom of the stairs. If you do not have them, ask someone to add them for you. What else  can I do to help prevent falls? Wear shoes that: Do not have high heels. Have rubber bottoms. Are comfortable and fit you well. Are closed at the toe. Do not wear sandals. If you use a stepladder: Make sure that it is fully opened. Do not climb a closed stepladder. Make sure that both sides of the stepladder are locked into place. Ask someone to hold it for you, if possible. Clearly mark and make sure that you can see: Any grab bars or handrails. First and last steps. Where the edge of each step is. Use tools that help you move around (mobility aids) if they are needed. These include: Canes. Walkers. Scooters. Crutches. Turn on the lights when you go into a dark area. Replace any light bulbs as soon as they burn out. Set up your furniture so you have a clear path. Avoid moving your furniture around. If any of your floors are uneven, fix them. If there are any pets around you, be aware of where they are. Review your medicines with your doctor. Some medicines can make you feel dizzy. This can increase your chance of falling. Ask your doctor what other things that you can do to help prevent falls. This information is not intended to replace advice given to you by your health care provider. Make sure you discuss any questions you have with your health care provider. Document Released: 04/27/2009 Document Revised: 12/07/2015 Document Reviewed: 08/05/2014 Elsevier Interactive Patient Education  2017 [employer].

## 2023-03-03 NOTE — Progress Notes (Signed)
Subjective:   Troy Rodgers. is a 77 y.o. male who presents for Medicare Annual/Subsequent preventive examination.  Visit Complete: Virtual  I connected with  Troy Rodgers. on 03/03/23 by a audio enabled telemedicine application and verified that I am speaking with the correct person using two identifiers.  Patient Location: Home  Provider Location: Home Office  I discussed the limitations of evaluation and management by telemedicine. The patient expressed understanding and agreed to proceed.  Patient Medicare AWV questionnaire was completed by the patient on 03/03/2023; I have confirmed that all information answered by patient is correct and no changes since this date.  Review of Systems    Vital Signs: Unable to obtain new vitals due to this being a telehealth visit.  Cardiac Risk Factors include: advanced age (>52men, >77 women);male gender;dyslipidemia;hypertension     Objective:    Today's Vitals   03/03/23 1105  Weight: 150 lb (68 kg)  Height: 5\' 6"  (1.676 m)   Body mass index is 24.21 kg/m.     03/03/2023   11:08 AM 02/27/2022   11:19 AM 02/26/2021   11:22 AM 05/19/2019    1:21 PM 10/29/2017    3:07 PM 10/28/2016    9:37 PM 04/28/2013   11:00 AM  Advanced Directives  Does Patient Have a Medical Advance Directive? Yes No Yes Yes No No   Type of Estate agent of [place];Living will  Healthcare Power of [place];Living will Healthcare Power of Attorney     Copy of Healthcare Power of Attorney in Chart? No - copy requested  No - copy requested      Would patient like information on creating a medical advance directive?  No - Patient declined  No - Patient declined Yes (MAU/Ambulatory/Procedural Areas - Information given)    Pre-existing out of facility DNR order (yellow form or pink MOST form)       No    Current Medications (verified) Outpatient Encounter Medications as of 03/03/2023  Medication Sig   aspirin 81 MG EC tablet Take 81 mg by  mouth daily.     atorvastatin (LIPITOR) 40 MG tablet Take 1 tablet (40 mg total) by mouth daily.   Cholecalciferol (VITAMIN D3) 10 MCG (400 UNIT) CAPS Take 800 Units by mouth.   escitalopram (LEXAPRO) 20 MG tablet Take 1 tablet (20 mg total) by mouth daily.   ezetimibe (ZETIA) 10 MG tablet Take 1 tablet (10 mg total) by mouth daily.   folic acid (FOLVITE) 1 MG tablet Take 1 mg by mouth 2 (two) times daily.    lacosamide (VIMPAT) 200 MG TABS tablet Take 200 mg by mouth 2 (two) times daily.   levothyroxine (SYNTHROID) 50 MCG tablet Take 1 tablet (50 mcg total) by mouth daily.   methotrexate (RHEUMATREX) 2.5 MG tablet Take 10 mg by mouth once a week.    omega-3 fish oil (MAXEPA) 1000 MG CAPS capsule Take 2 capsules by mouth daily.   omeprazole (PRILOSEC) 20 MG capsule Take 1 capsule (20 mg total) by mouth daily.   primidone (MYSOLINE) 250 MG tablet 250 mg. 1 in the am and 1 at bedtime   No facility-administered encounter medications on file as of 03/03/2023.    Allergies (verified) Fentanyl and Hydrocodone   History: Past Medical History:  Diagnosis Date   Abdominal pain    Arthritis    rheumatoid    Asthma    Bradycardia    Cataract    Chest pain  Cholelithiases    Depressive disorder, not elsewhere classified    GERD (gastroesophageal reflux disease)    Hearing deficit, bilateral    HLD (hyperlipidemia)    Hyperplasia of prostate    Impotence of organic origin    Kidney stone    Localization-related (focal) (partial) epilepsy and epileptic syndromes with complex partial seizures, without mention of intractable epilepsy    Nasal septal perforation    Other and unspecified hyperlipidemia    Seizures (HCC)    Thyroid disease    Past Surgical History:  Procedure Laterality Date   CHOLECYSTECTOMY  04/28/2013   Procedure: LAPAROSCOPIC CHOLECYSTECTOMY;  Surgeon: Axel Filler, MD;  Location: [facility] OR;  Service: General;;   LEFT SHOULDER SURGERY  1996   Family History   Problem Relation Age of Onset   Kidney failure Father        died age 25   Kidney disease Father    Heart disease Father        bypass surgery   Colon cancer Mother    Alzheimer's disease Mother    Hyperlipidemia Mother    Heart failure Sister        CHF   Heart disease Sister    Peripheral vascular disease Sister    Rheum arthritis Sister    Pancreatitis Sister    Pneumonia Sister    Hyperlipidemia Son    Alzheimer's disease Maternal Grandmother    Peripheral vascular disease Sister    Anxiety disorder Son    Social History   Socioeconomic History   Marital status: Married    Spouse name: Darel Hong    Number of children: 2   Years of education: 12   Highest education level: Some college, no degree  Occupational History   Occupation: Retired    Associate Professor: [employer]    Comment: age 41  Tobacco Use   Smoking status: Never   Smokeless tobacco: Never  Vaping Use   Vaping status: Never Used  Substance and Sexual Activity   Alcohol use: No   Drug use: No   Sexual activity: Not on file  Other Topics Concern   Not on file  Social History Narrative   Lives with wife.     Children live nearby, talk on the phone daily, but only get together for holidays   Patient hard of hearing - wears hearing aids - mild cognitive impairment   Social Determinants of Health   Financial Resource Strain: Low Risk  (03/03/2023)   Overall Financial Resource Strain (CARDIA)    Difficulty of Paying Living Expenses: Not hard at all  Food Insecurity: No Food Insecurity (03/03/2023)   Hunger Vital Sign    Worried About Running Out of Food in the Last Year: Never true    Ran Out of Food in the Last Year: Never true  Transportation Needs: No Transportation Needs (03/03/2023)   PRAPARE - Administrator, Civil Service (Medical): No    Lack of Transportation (Non-Medical): No  Physical Activity: Insufficiently Active (03/03/2023)   Exercise Vital Sign    Days of Exercise per  Week: 3 days    Minutes of Exercise per Session: 30 min  Stress: No Stress Concern Present (03/03/2023)   [employer] of Occupational Health - Occupational Stress Questionnaire    Feeling of Stress : Not at all  Social Connections: Moderately Isolated (03/03/2023)   Social Connection and Isolation Panel [NHANES]    Frequency of Communication with Friends and Family: More than three  times a week    Frequency of Social Gatherings with Friends and Family: More than three times a week    Attends Religious Services: Never    Database administrator or Organizations: No    Attends Engineer, structural: Never    Marital Status: Married    Tobacco Counseling Counseling given: Not Answered   Clinical Intake:  Pre-visit preparation completed: Yes  Pain : No/denies pain     Nutritional Risks: None Diabetes: No  How often do you need to have someone help you when you read instructions, pamphlets, or other written materials from your doctor or pharmacy?: 1 - Never  Interpreter Needed?: No  Information entered by :: Renie Ora, LPN   Activities of Daily Living    03/03/2023   11:08 AM  In your present state of health, do you have any difficulty performing the following activities:  Hearing? 0  Vision? 0  Difficulty concentrating or making decisions? 0  Walking or climbing stairs? 0  Dressing or bathing? 0  Doing errands, shopping? 0  Preparing Food and eating ? N  Using the Toilet? N  In the past six months, have you accidently leaked urine? N  Do you have problems with loss of bowel control? N  Managing your Medications? N  Managing your Finances? N  Housekeeping or managing your Housekeeping? N    Patient Care Team: Dettinger, Elige Radon, MD as PCP - General (Family Medicine) Antony Contras, MD as Consulting Physician (Ophthalmology) Beryle Beams, MD as Consulting Physician (Neurology) Rollene Rotunda, MD as Consulting Physician (Cardiology) Malissa Hippo, MD (Inactive) as Consulting Physician (Gastroenterology) Donnetta Hail, MD as Consulting Physician (Rheumatology) Nyoka Cowden, MD as Consulting Physician (Pulmonary Disease)  Indicate any recent Medical Services you may have received from other than [facility] providers in the past year (date may be approximate).     Assessment:   This is a routine wellness examination for Troy Rodgers.  Hearing/Vision screen Vision Screening - Comments:: Wears rx glasses - up to date with routine eye exams with  Dr.Lyles   Dietary issues and exercise activities discussed:     Goals Addressed             This Visit's Progress    Increase physical activity   On track    Stay active        Depression Screen    03/03/2023   11:07 AM 02/19/2023   10:20 AM 08/21/2022   10:00 AM 02/27/2022   11:18 AM 02/18/2022   10:26 AM 08/22/2021   10:27 AM 02/26/2021   11:05 AM  PHQ 2/9 Scores  PHQ - 2 Score 0 0 0 0 0 0 0  PHQ- 9 Score 0     1     Fall Risk    03/03/2023   11:05 AM 02/19/2023   10:20 AM 08/21/2022   10:00 AM 02/27/2022   11:13 AM 02/18/2022   10:26 AM  Fall Risk   Falls in the past year? 0 0 0 0 0  Number falls in past yr: 0   0   Injury with Fall? 0   0   Risk for fall due to : No Fall Risks   Other (Comment)   Risk for fall due to: Comment    Epilepsy, cerebellar gait   Follow up Falls prevention discussed   Falls prevention discussed     MEDICARE RISK AT HOME: Medicare Risk at Home Any stairs in or  around the home?: No If so, are there any without handrails?: No Home free of loose throw rugs in walkways, pet beds, electrical cords, etc?: Yes Adequate lighting in your home to reduce risk of falls?: Yes Life alert?: No Use of a cane, walker or w/c?: No Grab bars in the bathroom?: Yes Shower chair or bench in shower?: Yes Elevated toilet seat or a handicapped toilet?: Yes  TIMED UP AND GO:  Was the test performed?  No    Cognitive Function:    10/29/2017    3:11 PM  MMSE -  Mini Mental State Exam  Orientation to time 5  Orientation to Place 5  Registration 3  Attention/ Calculation 5  Recall 1  Language- name 2 objects 2  Language- repeat 1  Language- follow 3 step command 3  Language- read & follow direction 1  Write a sentence 1  Copy design 1  Total score 28        03/03/2023   11:08 AM 02/27/2022   11:20 AM 02/26/2021   11:08 AM 05/19/2019    1:24 PM  6CIT Screen  What Year? 0 points 0 points 0 points 0 points  What month? 0 points 0 points 3 points 0 points  What time? 0 points 0 points 0 points 0 points  Count back from 20 0 points 0 points 0 points 0 points  Months in reverse 0 points 0 points 4 points 0 points  Repeat phrase 0 points 6 points 6 points 2 points  Total Score 0 points 6 points 13 points 2 points    Immunizations Immunization History  Administered Date(s) Administered   Fluad Quad(high Dose 65+) 04/27/2019, 05/09/2020, 05/01/2021, 05/01/2022   Influenza Split 04/14/2013   Influenza Whole 04/14/2010   Influenza, High Dose Seasonal PF 05/07/2016, 05/07/2017, 04/27/2018   Influenza,inj,Quad PF,6+ Mos 05/04/2014, 04/19/2015   Moderna Sars-Covid-2 Vaccination 08/20/2019, 09/18/2019, 05/22/2020, 11/23/2020   Pneumococcal Conjugate-13 08/23/2013   Pneumococcal Polysaccharide-23 12/19/2010   Td 05/15/2008   Tdap 06/01/2018   Zoster Recombinant(Shingrix) 08/22/2021, 02/18/2022   Zoster, Live 05/15/2008    TDAP status: Up to date  Flu Vaccine status: Up to date  Pneumococcal vaccine status: Up to date  Covid-19 vaccine status: Completed vaccines  Qualifies for Shingles Vaccine? Yes   Zostavax completed Yes   Shingrix Completed?: Yes  Screening Tests Health Maintenance  Topic Date Due   Colonoscopy  05/11/2019   COVID-19 Vaccine (5 - 2023-24 season) 03/15/2022   INFLUENZA VACCINE  02/13/2023   Medicare Annual Wellness (AWV)  03/02/2024   DTaP/Tdap/Td (3 - Td or Tdap) 06/01/2028   Pneumonia Vaccine 70+ Years old   Completed   Hepatitis C Screening  Completed   Zoster Vaccines- Shingrix  Completed   HPV VACCINES  Aged Out    Health Maintenance  Health Maintenance Due  Topic Date Due   Colonoscopy  05/11/2019   COVID-19 Vaccine (5 - 2023-24 season) 03/15/2022   INFLUENZA VACCINE  02/13/2023    Colorectal cancer screening: No longer required.   Lung Cancer Screening: (Low Dose CT Chest recommended if Age 71-80 years, 20 pack-year currently smoking OR have quit w/in 15years.) does not qualify.   Lung Cancer Screening Referral: n/a  Additional Screening:  Hepatitis C Screening: does not qualify; Completed 12/26/2016  Vision Screening: Recommended annual ophthalmology exams for early detection of glaucoma and other disorders of the eye. Is the patient up to date with their annual eye exam?  Yes  Who is the provider  or what is the name of the office in which the patient attends annual eye exams? Dr.Lyles  If pt is not established with a provider, would they like to be referred to a provider to establish care? No .   Dental Screening: Recommended annual dental exams for proper oral hygiene    Community Resource Referral / Chronic Care Management: CRR required this visit?  No   CCM required this visit?  No     Plan:     I have personally reviewed and noted the following in the patient's chart:   Medical and social history Use of alcohol, tobacco or illicit drugs  Current medications and supplements including opioid prescriptions. Patient is not currently taking opioid prescriptions. Functional ability and status Nutritional status Physical activity Advanced directives List of other physicians Hospitalizations, surgeries, and ER visits in previous 12 months Vitals Screenings to include cognitive, depression, and falls Referrals and appointments  In addition, I have reviewed and discussed with patient certain preventive protocols, quality metrics, and best practice  recommendations. A written personalized care plan for preventive services as well as general preventive health recommendations were provided to patient.     Lorrene Reid, LPN   1/61/0960   After Visit Summary: (MyChart) Due to this being a telephonic visit, the after visit summary with patients personalized plan was offered to patient via MyChart   Nurse Notes: none

## 2023-03-11 DIAGNOSIS — R292 Abnormal reflex: Secondary | ICD-10-CM | POA: Diagnosis not present

## 2023-03-11 DIAGNOSIS — R2689 Other abnormalities of gait and mobility: Secondary | ICD-10-CM | POA: Diagnosis not present

## 2023-03-11 DIAGNOSIS — G40219 Localization-related (focal) (partial) symptomatic epilepsy and epileptic syndromes with complex partial seizures, intractable, without status epilepticus: Secondary | ICD-10-CM | POA: Diagnosis not present

## 2023-03-11 DIAGNOSIS — G629 Polyneuropathy, unspecified: Secondary | ICD-10-CM | POA: Diagnosis not present

## 2023-03-25 ENCOUNTER — Ambulatory Visit: Payer: Medicare PPO | Attending: Neurology

## 2023-03-25 ENCOUNTER — Other Ambulatory Visit: Payer: Self-pay

## 2023-03-25 DIAGNOSIS — R2681 Unsteadiness on feet: Secondary | ICD-10-CM | POA: Insufficient documentation

## 2023-03-25 DIAGNOSIS — M6281 Muscle weakness (generalized): Secondary | ICD-10-CM | POA: Diagnosis not present

## 2023-03-25 NOTE — Therapy (Signed)
OUTPATIENT PHYSICAL THERAPY NEURO EVALUATION   Patient Name: Troy Rodgers. MRN: [id number] DOB:[birth year], 77 y.o., male 44 Date: 03/25/2023   PCP: Dettinger, Elige Radon, MD REFERRING PROVIDER: Rema Jasmine, MD   END OF SESSION:  PT End of Session - 03/25/23 1102     Visit Number 1    Number of Visits 12    Date for PT Re-Evaluation 06/13/23    PT Start Time 1103    PT Stop Time 1150    PT Time Calculation (min) 47 min    Activity Tolerance Patient tolerated treatment well    Behavior During Therapy WFL for tasks assessed/performed             Past Medical History:  Diagnosis Date   Abdominal pain    Arthritis    rheumatoid    Asthma    Bradycardia    Cataract    Chest pain    Cholelithiases    Depressive disorder, not elsewhere classified    GERD (gastroesophageal reflux disease)    Hearing deficit, bilateral    HLD (hyperlipidemia)    Hyperplasia of prostate    Impotence of organic origin    Kidney stone    Localization-related (focal) (partial) epilepsy and epileptic syndromes with complex partial seizures, without mention of intractable epilepsy    Nasal septal perforation    Other and unspecified hyperlipidemia    Seizures (HCC)    Thyroid disease    Past Surgical History:  Procedure Laterality Date   CHOLECYSTECTOMY  04/28/2013   Procedure: LAPAROSCOPIC CHOLECYSTECTOMY;  Surgeon: Axel Filler, MD;  Location: [facility] OR;  Service: General;;   LEFT SHOULDER SURGERY  1996   Patient Active Problem List   Diagnosis Date Noted   Cerebellar gait 02/26/2021   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 (HCC) 02/26/2021   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 02/26/2021   Polyarthropathy 02/26/2021   GERD (gastroesophageal reflux disease) 04/16/2019   Hypothyroidism 05/04/2014   Solitary pulmonary nodule RLL lateral basal segment  10/12/2013   Rheumatoid arthritis (HCC) 08/19/2013   High risk medication  use 08/19/2013   BPH (benign prostatic hyperplasia) 08/19/2013   Seizure disorder (HCC) 05/06/2013   Hyperlipidemia    Depression, recurrent (HCC)    Localization-related (focal) (partial) epilepsy and epileptic syndromes with complex partial seizures, without mention of intractable epilepsy    Kidney stone    Impotence of organic origin    Nasal septal perforation    Hyperplasia of prostate     ONSET DATE: years ago, but it has slowly gotten worse  REFERRING DIAG: Gait & Balance Training-Polyneropathy with gait & balance impairment   THERAPY DIAG:  Unsteadiness on feet  Muscle weakness (generalized)  Rationale for Evaluation and Treatment: Rehabilitation  SUBJECTIVE:  SUBJECTIVE STATEMENT: Patient's wife has noticed that he has been stumbling around for a couple years now, but it has been slowly getting worse. He has neuropathy in both feet. He fell last week and bruised his left hand and scapped his head, but otherwise he was fine. He was getting up from his recliner and he thinks that his hands slipped which caused him to fall. He just began walking with a cane.  Pt accompanied by: significant other  PERTINENT HISTORY: epilepsy, seizure disorder, rheumatoid arthritis, depression, asthma, and hard of hearing  PAIN:  Are you having pain? No  PRECAUTIONS: Fall  RED FLAGS: None   WEIGHT BEARING RESTRICTIONS: No  FALLS: Has patient fallen in last 6 months? Yes. Number of falls 1  LIVING ENVIRONMENT: Lives with: lives with their spouse Lives in: House/apartment Stairs: Yes: External: 2-3 steps; can reach both Has following equipment at home: Single point cane  PLOF: Independent with basic ADLs  PATIENT GOALS: improved balance, safety, and mobility (be able to walk with his cane)    OBJECTIVE:   COGNITION: Overall cognitive status: Within functional limits for tasks assessed   SENSATION: Light touch: equal sensation bilaterally with no deficits noted  COORDINATION: No deficits observed  EDEMA:  No edema observed  POSTURE: rounded shoulders, forward head, and flexed trunk   LOWER EXTREMITY ROM: WFL for activities assessed  LOWER EXTREMITY MMT:    MMT Right Eval Left Eval  Hip flexion 4/5 4/5  Hip extension    Hip abduction    Hip adduction    Hip internal rotation    Hip external rotation    Knee flexion 4+/5 5/5  Knee extension 4/5 4+/5  Ankle dorsiflexion 4-/5 4-/5  Ankle plantarflexion    Ankle inversion    Ankle eversion    (Blank rows = not tested)  TRANSFERS: Assistive device utilized: Single point cane  Sit to stand: Complete Independence Stand to sit: Complete Independence  GAIT: Gait pattern: step through pattern, decreased stride length, trunk flexed, wide BOS, poor foot clearance- Right, and poor foot clearance- Left Assistive device utilized: Single point cane Level of assistance: Modified independence  FUNCTIONAL TESTS:  5 times sit to stand: 17.98 seconds without UE support Timed up and go (TUG): 19.99 seconds with SPC  BALANCE:   Narrow BOS, firm surface, eyes open and closed: 30 seconds each  Tandem, firm surface, right foot leading: 10 seconds Tandem, firm surface, left foot leading: 4 seconds  TODAY'S TREATMENT:                                                                                                                              DATE:     PATIENT EDUCATION: Education details: POC, balance, transfers, mobility, safety, and goal for therapy Person educated: Patient and Spouse Education method: Explanation Education comprehension: verbalized understanding  HOME EXERCISE PROGRAM:   GOALS: Goals reviewed with patient? Yes  SHORT TERM GOALS: Target date: 04/15/23  Patient will be independent with his  initial HEP.  Baseline: Goal status: INITIAL  2.  Patient will improve his timed up and go time to 15 seconds or less for improved functional mobility.  Baseline:  Goal status: INITIAL  LONG TERM GOALS: Target date: 05/06/23  Patient will be independent with his advanced HEP.  Baseline:  Goal status: INITIAL  2.  Patient will improve his timed up and go time to 12 seconds or less for to reduce his fall risk.   Baseline:  Goal status: INITIAL  3.  Patient will improve his five time sit to stand time to 12 seconds or less to reduce his fall risk.  Baseline:  Goal status: INITIAL  4.  Patient will be able to navigate at least 3 steps with a reciprocal pattern for improved household mobility.  Baseline:  Goal status: INITIAL  ASSESSMENT:  CLINICAL IMPRESSION: Patient is a 77 y.o. male who was seen today for physical therapy evaluation and treatment for lower extremity weakness and a high risk of falling. He is at a high risk of falling as evidenced by his objective measures, gait mechanics, and his history of falling. Recommend that he continue with skilled physical therapy to address his impairments to maximize his safety and functional mobility.     OBJECTIVE IMPAIRMENTS: Abnormal gait, decreased activity tolerance, decreased balance, decreased mobility, difficulty walking, decreased strength, and postural dysfunction.   ACTIVITY LIMITATIONS: carrying, lifting, standing, stairs, transfers, and locomotion level  PARTICIPATION LIMITATIONS: shopping, community activity, and yard work  PERSONAL FACTORS: Time since onset of injury/illness/exacerbation, Transportation, and 3+ comorbidities: epilepsy, seizure disorder, rheumatoid arthritis, depression, asthma, and hard of hearing  are also affecting patient's functional outcome.   REHAB POTENTIAL: Good  CLINICAL DECISION MAKING: Evolving/moderate complexity  EVALUATION COMPLEXITY: Moderate  PLAN:  PT FREQUENCY: 2x/week  PT  DURATION: 6 weeks  PLANNED INTERVENTIONS: Therapeutic exercises, Therapeutic activity, Neuromuscular re-education, Balance training, Gait training, Patient/Family education, Self Care, Stair training, and Re-evaluation  PLAN FOR NEXT SESSION: Nustep, lower extremity strengthening, balance interventions, and gait training   Granville Lewis, PT 03/25/2023, 1:37 PM

## 2023-03-27 ENCOUNTER — Ambulatory Visit: Payer: Medicare PPO

## 2023-03-27 DIAGNOSIS — M6281 Muscle weakness (generalized): Secondary | ICD-10-CM

## 2023-03-27 DIAGNOSIS — R2681 Unsteadiness on feet: Secondary | ICD-10-CM | POA: Diagnosis not present

## 2023-03-27 NOTE — Therapy (Signed)
OUTPATIENT PHYSICAL THERAPY NEURO TREATMENT   Patient Name: Troy Rodgers. MRN: [id number] DOB:[birth year], 77 y.o., male 64 Date: 03/27/2023   PCP: Dettinger, Elige Radon, MD REFERRING PROVIDER: Rema Jasmine, MD   END OF SESSION:  PT End of Session - 03/27/23 1058     Visit Number 2    Number of Visits 12    Date for PT Re-Evaluation 06/13/23    PT Start Time 1100    PT Stop Time 1142    PT Time Calculation (min) 42 min    Activity Tolerance Patient tolerated treatment well    Behavior During Therapy WFL for tasks assessed/performed              Past Medical History:  Diagnosis Date   Abdominal pain    Arthritis    rheumatoid    Asthma    Bradycardia    Cataract    Chest pain    Cholelithiases    Depressive disorder, not elsewhere classified    GERD (gastroesophageal reflux disease)    Hearing deficit, bilateral    HLD (hyperlipidemia)    Hyperplasia of prostate    Impotence of organic origin    Kidney stone    Localization-related (focal) (partial) epilepsy and epileptic syndromes with complex partial seizures, without mention of intractable epilepsy    Nasal septal perforation    Other and unspecified hyperlipidemia    Seizures (HCC)    Thyroid disease    Past Surgical History:  Procedure Laterality Date   CHOLECYSTECTOMY  04/28/2013   Procedure: LAPAROSCOPIC CHOLECYSTECTOMY;  Surgeon: Axel Filler, MD;  Location: [facility] OR;  Service: General;;   LEFT SHOULDER SURGERY  1996   Patient Active Problem List   Diagnosis Date Noted   Cerebellar gait 02/26/2021   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 (HCC) 02/26/2021   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 02/26/2021   Polyarthropathy 02/26/2021   GERD (gastroesophageal reflux disease) 04/16/2019   Hypothyroidism 05/04/2014   Solitary pulmonary nodule RLL lateral basal segment  10/12/2013   Rheumatoid arthritis (HCC) 08/19/2013   High risk medication  use 08/19/2013   BPH (benign prostatic hyperplasia) 08/19/2013   Seizure disorder (HCC) 05/06/2013   Hyperlipidemia    Depression, recurrent (HCC)    Localization-related (focal) (partial) epilepsy and epileptic syndromes with complex partial seizures, without mention of intractable epilepsy    Kidney stone    Impotence of organic origin    Nasal septal perforation    Hyperplasia of prostate     ONSET DATE: years ago, but it has slowly gotten worse  REFERRING DIAG: Gait & Balance Training-Polyneropathy with gait & balance impairment   THERAPY DIAG:  Unsteadiness on feet  Muscle weakness (generalized)  Rationale for Evaluation and Treatment: Rehabilitation  SUBJECTIVE:  SUBJECTIVE STATEMENT: Patient reports that he feels alright today.  Pt accompanied by: significant other  PERTINENT HISTORY: epilepsy, seizure disorder, rheumatoid arthritis, depression, asthma, and hard of hearing  PAIN:  Are you having pain? No  PRECAUTIONS: Fall  RED FLAGS: None   WEIGHT BEARING RESTRICTIONS: No  FALLS: Has patient fallen in last 6 months? Yes. Number of falls 1  LIVING ENVIRONMENT: Lives with: lives with their spouse Lives in: House/apartment Stairs: Yes: External: 2-3 steps; can reach both Has following equipment at home: Single point cane  PLOF: Independent with basic ADLs  PATIENT GOALS: improved balance, safety, and mobility (be able to walk with his cane)   OBJECTIVE: all objective measures were assessed at his initial evaluation on 03/25/23 unless otherwise noted  COGNITION: Overall cognitive status: Within functional limits for tasks assessed   SENSATION: Light touch: equal sensation bilaterally with no deficits noted  COORDINATION: No deficits observed  EDEMA:  No edema  observed  POSTURE: rounded shoulders, forward head, and flexed trunk   LOWER EXTREMITY ROM: WFL for activities assessed  LOWER EXTREMITY MMT:    MMT Right Eval Left Eval  Hip flexion 4/5 4/5  Hip extension    Hip abduction    Hip adduction    Hip internal rotation    Hip external rotation    Knee flexion 4+/5 5/5  Knee extension 4/5 4+/5  Ankle dorsiflexion 4-/5 4-/5  Ankle plantarflexion    Ankle inversion    Ankle eversion    (Blank rows = not tested)  TRANSFERS: Assistive device utilized: Single point cane  Sit to stand: Complete Independence Stand to sit: Complete Independence  GAIT: Gait pattern: step through pattern, decreased stride length, trunk flexed, wide BOS, poor foot clearance- Right, and poor foot clearance- Left Assistive device utilized: Single point cane Level of assistance: Modified independence  FUNCTIONAL TESTS:  5 times sit to stand: 17.98 seconds without UE support Timed up and go (TUG): 19.99 seconds with SPC  BALANCE:   Narrow BOS, firm surface, eyes open and closed: 30 seconds each  Tandem, firm surface, right foot leading: 10 seconds Tandem, firm surface, left foot leading: 4 seconds  TODAY'S TREATMENT:                                                                                                                              DATE:                                     03/27/23 EXERCISE LOG  Exercise Repetitions and Resistance Comments  Nustep  L4 x 14 minutes    LAQ 4# x 2 minutes  Alternating LE  Seated marching  4# x 2 minutes  Alternating LE   Seated hip ADD isometric  3 minute w/ 5 second hold   Seated heel/toe raises  3 minutes    Static stance  on foam  2. 5 minutes  Wide BOS; intermittent UE support and minA   Step up  6" step x 2 minutes  Alternating LE   Standing HS curl  2 minutes  Alternating LE   Standing hip ABD  2 minutes  Alternating LE    Blank cell = exercise not performed today   PATIENT EDUCATION: Education  details: POC, balance, transfers, mobility, safety, and goal for therapy Person educated: Patient and Spouse Education method: Explanation Education comprehension: verbalized understanding  HOME EXERCISE PROGRAM:   GOALS: Goals reviewed with patient? Yes  SHORT TERM GOALS: Target date: 04/15/23  Patient will be independent with his initial HEP.  Baseline: Goal status: INITIAL  2.  Patient will improve his timed up and go time to 15 seconds or less for improved functional mobility.  Baseline:  Goal status: INITIAL  LONG TERM GOALS: Target date: 05/06/23  Patient will be independent with his advanced HEP.  Baseline:  Goal status: INITIAL  2.  Patient will improve his timed up and go time to 12 seconds or less for to reduce his fall risk.   Baseline:  Goal status: INITIAL  3.  Patient will improve his five time sit to stand time to 12 seconds or less to reduce his fall risk.  Baseline:  Goal status: INITIAL  4.  Patient will be able to navigate at least 3 steps with a reciprocal pattern for improved household mobility.  Baseline:  Goal status: INITIAL  ASSESSMENT:  CLINICAL IMPRESSION: Patient was introduced to multiple new interventions for improved lower extremity strength with minimal to moderate difficulty. He required minimal cueing with today's new interventions for proper biomechanics. He experienced no increase in pain or discomfort with any of today's interventions. He reported feeling tired upon the conclusion of treatment. He continues to require skilled physical therapy to address his remaining impairments to maximize his safety and functional mobility.    OBJECTIVE IMPAIRMENTS: Abnormal gait, decreased activity tolerance, decreased balance, decreased mobility, difficulty walking, decreased strength, and postural dysfunction.   ACTIVITY LIMITATIONS: carrying, lifting, standing, stairs, transfers, and locomotion level  PARTICIPATION LIMITATIONS: shopping,  community activity, and yard work  PERSONAL FACTORS: Time since onset of injury/illness/exacerbation, Transportation, and 3+ comorbidities: epilepsy, seizure disorder, rheumatoid arthritis, depression, asthma, and hard of hearing  are also affecting patient's functional outcome.   REHAB POTENTIAL: Good  CLINICAL DECISION MAKING: Evolving/moderate complexity  EVALUATION COMPLEXITY: Moderate  PLAN:  PT FREQUENCY: 2x/week  PT DURATION: 6 weeks  PLANNED INTERVENTIONS: Therapeutic exercises, Therapeutic activity, Neuromuscular re-education, Balance training, Gait training, Patient/Family education, Self Care, Stair training, and Re-evaluation  PLAN FOR NEXT SESSION: Nustep, lower extremity strengthening, balance interventions, and gait training   Granville Lewis, PT 03/27/2023, 12:50 PM

## 2023-04-01 ENCOUNTER — Ambulatory Visit: Payer: Medicare PPO

## 2023-04-01 DIAGNOSIS — M6281 Muscle weakness (generalized): Secondary | ICD-10-CM

## 2023-04-01 DIAGNOSIS — R2681 Unsteadiness on feet: Secondary | ICD-10-CM | POA: Diagnosis not present

## 2023-04-01 NOTE — Therapy (Signed)
OUTPATIENT PHYSICAL THERAPY NEURO TREATMENT   Patient Name: Troy Rodgers. MRN: [id number] DOB:[birth year], 77 y.o., male 38 Date: 04/01/2023   PCP: Dettinger, Elige Radon, MD REFERRING PROVIDER: Rema Jasmine, MD   END OF SESSION:  PT End of Session - 04/01/23 1104     Visit Number 3    Number of Visits 12    Date for PT Re-Evaluation 06/13/23    PT Start Time 1100    PT Stop Time 1140    PT Time Calculation (min) 40 min    Activity Tolerance Patient tolerated treatment well    Behavior During Therapy WFL for tasks assessed/performed              Past Medical History:  Diagnosis Date   Abdominal pain    Arthritis    rheumatoid    Asthma    Bradycardia    Cataract    Chest pain    Cholelithiases    Depressive disorder, not elsewhere classified    GERD (gastroesophageal reflux disease)    Hearing deficit, bilateral    HLD (hyperlipidemia)    Hyperplasia of prostate    Impotence of organic origin    Kidney stone    Localization-related (focal) (partial) epilepsy and epileptic syndromes with complex partial seizures, without mention of intractable epilepsy    Nasal septal perforation    Other and unspecified hyperlipidemia    Seizures (HCC)    Thyroid disease    Past Surgical History:  Procedure Laterality Date   CHOLECYSTECTOMY  04/28/2013   Procedure: LAPAROSCOPIC CHOLECYSTECTOMY;  Surgeon: Axel Filler, MD;  Location: [facility] OR;  Service: General;;   LEFT SHOULDER SURGERY  1996   Patient Active Problem List   Diagnosis Date Noted   Cerebellar gait 02/26/2021   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 (HCC) 02/26/2021   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 02/26/2021   Polyarthropathy 02/26/2021   GERD (gastroesophageal reflux disease) 04/16/2019   Hypothyroidism 05/04/2014   Solitary pulmonary nodule RLL lateral basal segment  10/12/2013   Rheumatoid arthritis (HCC) 08/19/2013   High risk medication  use 08/19/2013   BPH (benign prostatic hyperplasia) 08/19/2013   Seizure disorder (HCC) 05/06/2013   Hyperlipidemia    Depression, recurrent (HCC)    Localization-related (focal) (partial) epilepsy and epileptic syndromes with complex partial seizures, without mention of intractable epilepsy    Kidney stone    Impotence of organic origin    Nasal septal perforation    Hyperplasia of prostate     ONSET DATE: years ago, but it has slowly gotten worse  REFERRING DIAG: Gait & Balance Training-Polyneropathy with gait & balance impairment   THERAPY DIAG:  Unsteadiness on feet  Muscle weakness (generalized)  Rationale for Evaluation and Treatment: Rehabilitation  SUBJECTIVE:  SUBJECTIVE STATEMENT: Pt denies any pain today.  Reports feeling "old." Pt accompanied by: significant other  PERTINENT HISTORY: epilepsy, seizure disorder, rheumatoid arthritis, depression, asthma, and hard of hearing  PAIN:  Are you having pain? No  PRECAUTIONS: Fall  RED FLAGS: None   WEIGHT BEARING RESTRICTIONS: No  FALLS: Has patient fallen in last 6 months? Yes. Number of falls 1  LIVING ENVIRONMENT: Lives with: lives with their spouse Lives in: House/apartment Stairs: Yes: External: 2-3 steps; can reach both Has following equipment at home: Single point cane  PLOF: Independent with basic ADLs  PATIENT GOALS: improved balance, safety, and mobility (be able to walk with his cane)   OBJECTIVE: all objective measures were assessed at his initial evaluation on 03/25/23 unless otherwise noted  COGNITION: Overall cognitive status: Within functional limits for tasks assessed   SENSATION: Light touch: equal sensation bilaterally with no deficits noted  COORDINATION: No deficits observed  EDEMA:  No edema  observed  POSTURE: rounded shoulders, forward head, and flexed trunk   LOWER EXTREMITY ROM: WFL for activities assessed  LOWER EXTREMITY MMT:    MMT Right Eval Left Eval  Hip flexion 4/5 4/5  Hip extension    Hip abduction    Hip adduction    Hip internal rotation    Hip external rotation    Knee flexion 4+/5 5/5  Knee extension 4/5 4+/5  Ankle dorsiflexion 4-/5 4-/5  Ankle plantarflexion    Ankle inversion    Ankle eversion    (Blank rows = not tested)  TRANSFERS: Assistive device utilized: Single point cane  Sit to stand: Complete Independence Stand to sit: Complete Independence  GAIT: Gait pattern: step through pattern, decreased stride length, trunk flexed, wide BOS, poor foot clearance- Right, and poor foot clearance- Left Assistive device utilized: Single point cane Level of assistance: Modified independence  FUNCTIONAL TESTS:  5 times sit to stand: 17.98 seconds without UE support Timed up and go (TUG): 19.99 seconds with SPC  BALANCE:   Narrow BOS, firm surface, eyes open and closed: 30 seconds each  Tandem, firm surface, right foot leading: 10 seconds Tandem, firm surface, left foot leading: 4 seconds  TODAY'S TREATMENT:                                                                                                                              DATE:                                     04/01/23 EXERCISE LOG  Exercise Repetitions and Resistance Comments  Nustep  L4 x 15 minutes    Rockerboard 2 mins   LAQ 4# x 2.5 minutes  Alternating LE  Seated marching  4# x 2.5 minutes  Alternating LE   Seated hip ADD isometric  3 minute w/ 5 second hold   Seated heel/toe raises  3  minutes    Marching on foam 2 minutes  Wide BOS; intermittent UE support and minA   Step up  6" step x 2 minutes  Alternating LE   Standing HS curl  25 reps Alternating LE   Standing hip ABD  25 reps Alternating LE    Blank cell = exercise not performed today   PATIENT  EDUCATION: Education details: POC, balance, transfers, mobility, safety, and goal for therapy Person educated: Patient and Spouse Education method: Explanation Education comprehension: verbalized understanding  HOME EXERCISE PROGRAM:   GOALS: Goals reviewed with patient? Yes  SHORT TERM GOALS: Target date: 04/15/23  Patient will be independent with his initial HEP.  Baseline: Goal status: INITIAL  2.  Patient will improve his timed up and go time to 15 seconds or less for improved functional mobility.  Baseline:  Goal status: INITIAL  LONG TERM GOALS: Target date: 05/06/23  Patient will be independent with his advanced HEP.  Baseline:  Goal status: INITIAL  2.  Patient will improve his timed up and go time to 12 seconds or less for to reduce his fall risk.   Baseline:  Goal status: INITIAL  3.  Patient will improve his five time sit to stand time to 12 seconds or less to reduce his fall risk.  Baseline:  Goal status: INITIAL  4.  Patient will be able to navigate at least 3 steps with a reciprocal pattern for improved household mobility.  Baseline:  Goal status: INITIAL  ASSESSMENT:  CLINICAL IMPRESSION: Pt arrives for today's treatment session denying any pain.  Pt able to tolerate increased time or reps with all exercises performed today.  Pt able to tolerate standing marching on [employer] pad with pt requiring BUE support.  Pt given standing and seated rest breaks as needed due to fatigue.  Pt denied any pain at completion of today's treatment session.   OBJECTIVE IMPAIRMENTS: Abnormal gait, decreased activity tolerance, decreased balance, decreased mobility, difficulty walking, decreased strength, and postural dysfunction.   ACTIVITY LIMITATIONS: carrying, lifting, standing, stairs, transfers, and locomotion level  PARTICIPATION LIMITATIONS: shopping, community activity, and yard work  PERSONAL FACTORS: Time since onset of injury/illness/exacerbation, Transportation,  and 3+ comorbidities: epilepsy, seizure disorder, rheumatoid arthritis, depression, asthma, and hard of hearing  are also affecting patient's functional outcome.   REHAB POTENTIAL: Good  CLINICAL DECISION MAKING: Evolving/moderate complexity  EVALUATION COMPLEXITY: Moderate  PLAN:  PT FREQUENCY: 2x/week  PT DURATION: 6 weeks  PLANNED INTERVENTIONS: Therapeutic exercises, Therapeutic activity, Neuromuscular re-education, Balance training, Gait training, Patient/Family education, Self Care, Stair training, and Re-evaluation  PLAN FOR NEXT SESSION: Nustep, lower extremity strengthening, balance interventions, and gait training   Newman Pies, PTA 04/01/2023, 11:47 AM

## 2023-04-03 ENCOUNTER — Ambulatory Visit: Payer: Medicare PPO

## 2023-04-03 DIAGNOSIS — M6281 Muscle weakness (generalized): Secondary | ICD-10-CM | POA: Diagnosis not present

## 2023-04-03 DIAGNOSIS — R2681 Unsteadiness on feet: Secondary | ICD-10-CM

## 2023-04-03 NOTE — Therapy (Signed)
OUTPATIENT PHYSICAL THERAPY NEURO TREATMENT   Patient Name: Troy Rodgers. MRN: [id number] DOB:1945-07-31, 77 y.o., male 34 Date: 04/03/2023   PCP: Dettinger, Elige Radon, MD REFERRING PROVIDER: Rema Jasmine, MD   END OF SESSION:  PT End of Session - 04/03/23 1058     Visit Number 4    Number of Visits 12    Date for PT Re-Evaluation 06/13/23    PT Start Time 1100    PT Stop Time 1142    PT Time Calculation (min) 42 min    Activity Tolerance Patient tolerated treatment well    Behavior During Therapy WFL for tasks assessed/performed               Past Medical History:  Diagnosis Date   Abdominal pain    Arthritis    rheumatoid    Asthma    Bradycardia    Cataract    Chest pain    Cholelithiases    Depressive disorder, not elsewhere classified    GERD (gastroesophageal reflux disease)    Hearing deficit, bilateral    HLD (hyperlipidemia)    Hyperplasia of prostate    Impotence of organic origin    Kidney stone    Localization-related (focal) (partial) epilepsy and epileptic syndromes with complex partial seizures, without mention of intractable epilepsy    Nasal septal perforation    Other and unspecified hyperlipidemia    Seizures (HCC)    Thyroid disease    Past Surgical History:  Procedure Laterality Date   CHOLECYSTECTOMY  04/28/2013   Procedure: LAPAROSCOPIC CHOLECYSTECTOMY;  Surgeon: Axel Filler, MD;  Location: [facility] OR;  Service: General;;   LEFT SHOULDER SURGERY  1996   Patient Active Problem List   Diagnosis Date Noted   Cerebellar gait 02/26/2021   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 (HCC) 02/26/2021   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 02/26/2021   Polyarthropathy 02/26/2021   GERD (gastroesophageal reflux disease) 04/16/2019   Hypothyroidism 05/04/2014   Solitary pulmonary nodule RLL lateral basal segment  10/12/2013   Rheumatoid arthritis (HCC) 08/19/2013   High risk  medication use 08/19/2013   BPH (benign prostatic hyperplasia) 08/19/2013   Seizure disorder (HCC) 05/06/2013   Hyperlipidemia    Depression, recurrent (HCC)    Localization-related (focal) (partial) epilepsy and epileptic syndromes with complex partial seizures, without mention of intractable epilepsy    Kidney stone    Impotence of organic origin    Nasal septal perforation    Hyperplasia of prostate     ONSET DATE: years ago, but it has slowly gotten worse  REFERRING DIAG: Gait & Balance Training-Polyneropathy with gait & balance impairment   THERAPY DIAG:  Unsteadiness on feet  Muscle weakness (generalized)  Rationale for Evaluation and Treatment: Rehabilitation  SUBJECTIVE:  SUBJECTIVE STATEMENT: Patient reports that he feels good today.  Pt accompanied by: significant other  PERTINENT HISTORY: epilepsy, seizure disorder, rheumatoid arthritis, depression, asthma, and hard of hearing  PAIN:  Are you having pain? No  PRECAUTIONS: Fall  RED FLAGS: None   WEIGHT BEARING RESTRICTIONS: No  FALLS: Has patient fallen in last 6 months? Yes. Number of falls 1  LIVING ENVIRONMENT: Lives with: lives with their spouse Lives in: House/apartment Stairs: Yes: External: 2-3 steps; can reach both Has following equipment at home: Single point cane  PLOF: Independent with basic ADLs  PATIENT GOALS: improved balance, safety, and mobility (be able to walk with his cane)   OBJECTIVE: all objective measures were assessed at his initial evaluation on 03/25/23 unless otherwise noted  COGNITION: Overall cognitive status: Within functional limits for tasks assessed   SENSATION: Light touch: equal sensation bilaterally with no deficits noted  COORDINATION: No deficits observed  EDEMA:  No  edema observed  POSTURE: rounded shoulders, forward head, and flexed trunk   LOWER EXTREMITY ROM: WFL for activities assessed  LOWER EXTREMITY MMT:    MMT Right Eval Left Eval  Hip flexion 4/5 4/5  Hip extension    Hip abduction    Hip adduction    Hip internal rotation    Hip external rotation    Knee flexion 4+/5 5/5  Knee extension 4/5 4+/5  Ankle dorsiflexion 4-/5 4-/5  Ankle plantarflexion    Ankle inversion    Ankle eversion    (Blank rows = not tested)  TRANSFERS: Assistive device utilized: Single point cane  Sit to stand: Complete Independence Stand to sit: Complete Independence  GAIT: Gait pattern: step through pattern, decreased stride length, trunk flexed, wide BOS, poor foot clearance- Right, and poor foot clearance- Left Assistive device utilized: Single point cane Level of assistance: Modified independence  FUNCTIONAL TESTS:  5 times sit to stand: 17.98 seconds without UE support Timed up and go (TUG): 19.99 seconds with SPC  BALANCE:   Narrow BOS, firm surface, eyes open and closed: 30 seconds each  Tandem, firm surface, right foot leading: 10 seconds Tandem, firm surface, left foot leading: 4 seconds  TODAY'S TREATMENT:                                                                                                                              DATE:                                     04/03/23 EXERCISE LOG  Exercise Repetitions and Resistance Comments  Nustep  L4 x 15 minutes    Rocker board  2.5 minutes  BUE support   Marching on foam  2.5 minutes  BUE support   Static stance on foam  2 minutes  Intermittent UE support  LAQ 4# x 3 minutes  Alternating LE   Seated marching  4# x 3 minutes   Seated hip ADD isometric  15 reps w/ 5 second hold    Standing HS curl  2 minutes  Alternating LE   Sit to stand  10 reps     Blank cell = exercise not performed today                                    04/01/23 EXERCISE LOG  Exercise Repetitions and  Resistance Comments  Nustep  L4 x 15 minutes    Rockerboard 2 mins   LAQ 4# x 2.5 minutes  Alternating LE  Seated marching  4# x 2.5 minutes  Alternating LE   Seated hip ADD isometric  3 minute w/ 5 second hold   Seated heel/toe raises  3 minutes    Marching on foam 2 minutes  Wide BOS; intermittent UE support and minA   Step up  6" step x 2 minutes  Alternating LE   Standing HS curl  25 reps Alternating LE   Standing hip ABD  25 reps Alternating LE    Blank cell = exercise not performed today   PATIENT EDUCATION: Education details: POC, balance, transfers, mobility, safety, and goal for therapy Person educated: Patient and Spouse Education method: Explanation Education comprehension: verbalized understanding  HOME EXERCISE PROGRAM:   GOALS: Goals reviewed with patient? Yes  SHORT TERM GOALS: Target date: 04/15/23  Patient will be independent with his initial HEP.  Baseline: Goal status: INITIAL  2.  Patient will improve his timed up and go time to 15 seconds or less for improved functional mobility.  Baseline:  Goal status: INITIAL  LONG TERM GOALS: Target date: 05/06/23  Patient will be independent with his advanced HEP.  Baseline:  Goal status: INITIAL  2.  Patient will improve his timed up and go time to 12 seconds or less for to reduce his fall risk.   Baseline:  Goal status: INITIAL  3.  Patient will improve his five time sit to stand time to 12 seconds or less to reduce his fall risk.  Baseline:  Goal status: INITIAL  4.  Patient will be able to navigate at least 3 steps with a reciprocal pattern for improved household mobility.  Baseline:  Goal status: INITIAL  ASSESSMENT:  CLINICAL IMPRESSION: Patient was progressed with progressed with sit to stands in addition to familiar interventions for improved lower extremity strength needed for improved functional mobility. He required minimal cueing with today's standing interventions for improved posture to  facilitate improved awareness of his surroundings. Lower extremity fatigue was his primary limitation with today's interventions as he required brief seated rest breaks throughout treatment. He reported feeling tired and weak upon the conclusion of treatment. He continues to require skilled physical therapy to address his remaining impairments to maximize his safety and functional mobility.   OBJECTIVE IMPAIRMENTS: Abnormal gait, decreased activity tolerance, decreased balance, decreased mobility, difficulty walking, decreased strength, and postural dysfunction.   ACTIVITY LIMITATIONS: carrying, lifting, standing, stairs, transfers, and locomotion level  PARTICIPATION LIMITATIONS: shopping, community activity, and yard work  PERSONAL FACTORS: Time since onset of injury/illness/exacerbation, Transportation, and 3+ comorbidities: epilepsy, seizure disorder, rheumatoid arthritis, depression, asthma, and hard of hearing  are also affecting patient's functional outcome.   REHAB POTENTIAL: Good  CLINICAL DECISION MAKING: Evolving/moderate complexity  EVALUATION COMPLEXITY: Moderate  PLAN:  PT FREQUENCY: 2x/week  PT DURATION: 6 weeks  PLANNED INTERVENTIONS: Therapeutic  exercises, Therapeutic activity, Neuromuscular re-education, Balance training, Gait training, Patient/Family education, Self Care, Stair training, and Re-evaluation  PLAN FOR NEXT SESSION: Nustep, lower extremity strengthening, balance interventions, and gait training   Granville Lewis, PT 04/03/2023, 12:15 PM

## 2023-04-09 ENCOUNTER — Ambulatory Visit: Payer: Medicare PPO

## 2023-04-09 DIAGNOSIS — M6281 Muscle weakness (generalized): Secondary | ICD-10-CM

## 2023-04-09 DIAGNOSIS — R2681 Unsteadiness on feet: Secondary | ICD-10-CM | POA: Diagnosis not present

## 2023-04-09 NOTE — Therapy (Signed)
OUTPATIENT PHYSICAL THERAPY NEURO TREATMENT   Patient Name: Troy Rodgers. MRN: [id number] DOB:[birth year], 77 y.o., male 41 Date: 04/09/2023   PCP: Dettinger, Elige Radon, MD REFERRING PROVIDER: Rema Jasmine, MD   END OF SESSION:  PT End of Session - 04/09/23 1319     Visit Number 5    Number of Visits 12    Date for PT Re-Evaluation 06/13/23    PT Start Time 1300    PT Stop Time 1343    PT Time Calculation (min) 43 min    Activity Tolerance Patient tolerated treatment well    Behavior During Therapy WFL for tasks assessed/performed                Past Medical History:  Diagnosis Date   Abdominal pain    Arthritis    rheumatoid    Asthma    Bradycardia    Cataract    Chest pain    Cholelithiases    Depressive disorder, not elsewhere classified    GERD (gastroesophageal reflux disease)    Hearing deficit, bilateral    HLD (hyperlipidemia)    Hyperplasia of prostate    Impotence of organic origin    Kidney stone    Localization-related (focal) (partial) epilepsy and epileptic syndromes with complex partial seizures, without mention of intractable epilepsy    Nasal septal perforation    Other and unspecified hyperlipidemia    Seizures (HCC)    Thyroid disease    Past Surgical History:  Procedure Laterality Date   CHOLECYSTECTOMY  04/28/2013   Procedure: LAPAROSCOPIC CHOLECYSTECTOMY;  Surgeon: Axel Filler, MD;  Location: [facility] OR;  Service: General;;   LEFT SHOULDER SURGERY  1996   Patient Active Problem List   Diagnosis Date Noted   Cerebellar gait 02/26/2021   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 (HCC) 02/26/2021   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 02/26/2021   Polyarthropathy 02/26/2021   GERD (gastroesophageal reflux disease) 04/16/2019   Hypothyroidism 05/04/2014   Solitary pulmonary nodule RLL lateral basal segment  10/12/2013   Rheumatoid arthritis (HCC) 08/19/2013   High risk  medication use 08/19/2013   BPH (benign prostatic hyperplasia) 08/19/2013   Seizure disorder (HCC) 05/06/2013   Hyperlipidemia    Depression, recurrent (HCC)    Localization-related (focal) (partial) epilepsy and epileptic syndromes with complex partial seizures, without mention of intractable epilepsy    Kidney stone    Impotence of organic origin    Nasal septal perforation    Hyperplasia of prostate     ONSET DATE: years ago, but it has slowly gotten worse  REFERRING DIAG: Gait & Balance Training-Polyneropathy with gait & balance impairment   THERAPY DIAG:  Unsteadiness on feet  Muscle weakness (generalized)  Rationale for Evaluation and Treatment: Rehabilitation  SUBJECTIVE:  SUBJECTIVE STATEMENT: Patient reports that he feels good today.  Pt accompanied by: significant other  PERTINENT HISTORY: epilepsy, seizure disorder, rheumatoid arthritis, depression, asthma, and hard of hearing  PAIN:  Are you having pain? No  PRECAUTIONS: Fall  RED FLAGS: None   WEIGHT BEARING RESTRICTIONS: No  FALLS: Has patient fallen in last 6 months? Yes. Number of falls 1  LIVING ENVIRONMENT: Lives with: lives with their spouse Lives in: House/apartment Stairs: Yes: External: 2-3 steps; can reach both Has following equipment at home: Single point cane  PLOF: Independent with basic ADLs  PATIENT GOALS: improved balance, safety, and mobility (be able to walk with his cane)   OBJECTIVE: all objective measures were assessed at his initial evaluation on 03/25/23 unless otherwise noted  COGNITION: Overall cognitive status: Within functional limits for tasks assessed   SENSATION: Light touch: equal sensation bilaterally with no deficits noted  COORDINATION: No deficits observed  EDEMA:  No  edema observed  POSTURE: rounded shoulders, forward head, and flexed trunk   LOWER EXTREMITY ROM: WFL for activities assessed  LOWER EXTREMITY MMT:    MMT Right Eval Left Eval  Hip flexion 4/5 4/5  Hip extension    Hip abduction    Hip adduction    Hip internal rotation    Hip external rotation    Knee flexion 4+/5 5/5  Knee extension 4/5 4+/5  Ankle dorsiflexion 4-/5 4-/5  Ankle plantarflexion    Ankle inversion    Ankle eversion    (Blank rows = not tested)  TRANSFERS: Assistive device utilized: Single point cane  Sit to stand: Complete Independence Stand to sit: Complete Independence  GAIT: Gait pattern: step through pattern, decreased stride length, trunk flexed, wide BOS, poor foot clearance- Right, and poor foot clearance- Left Assistive device utilized: Single point cane Level of assistance: Modified independence  FUNCTIONAL TESTS:  5 times sit to stand: 17.98 seconds without UE support Timed up and go (TUG): 19.99 seconds with SPC  BALANCE:   Narrow BOS, firm surface, eyes open and closed: 30 seconds each  Tandem, firm surface, right foot leading: 10 seconds Tandem, firm surface, left foot leading: 4 seconds  TODAY'S TREATMENT:                                                                                                                              DATE:                                     04/09/23 EXERCISE LOG  Exercise Repetitions and Resistance Comments  Nustep  L5 x 19 minutes    Marching on foam  2.5 minutes   Standing hip ABD 2.5 minutes  Alternating LE   LAQ 5# x 2.5 minutes  Alternating LE   Seated marching  5# x 2.5 minutes  Alternating LE  Seated heel/toe raise  20 reps  each    Supine hip flexor stretch  1.5 minutes     Blank cell = exercise not performed today                                    04/03/23 EXERCISE LOG  Exercise Repetitions and Resistance Comments  Nustep  L4 x 15 minutes    Rocker board  2.5 minutes  BUE support    Marching on foam  2.5 minutes  BUE support   Static stance on foam  2 minutes  Intermittent UE support  LAQ 4# x 3 minutes  Alternating LE   Seated marching  4# x 3 minutes   Seated hip ADD isometric  15 reps w/ 5 second hold    Standing HS curl  2 minutes  Alternating LE   Sit to stand  10 reps     Blank cell = exercise not performed today                                    04/01/23 EXERCISE LOG  Exercise Repetitions and Resistance Comments  Nustep  L4 x 15 minutes    Rockerboard 2 mins   LAQ 4# x 2.5 minutes  Alternating LE  Seated marching  4# x 2.5 minutes  Alternating LE   Seated hip ADD isometric  3 minute w/ 5 second hold   Seated heel/toe raises  3 minutes    Marching on foam 2 minutes  Wide BOS; intermittent UE support and minA   Step up  6" step x 2 minutes  Alternating LE   Standing HS curl  25 reps Alternating LE   Standing hip ABD  25 reps Alternating LE    Blank cell = exercise not performed today   PATIENT EDUCATION: Education details: POC, balance, transfers, mobility, safety, and goal for therapy Person educated: Patient and Spouse Education method: Explanation Education comprehension: verbalized understanding  HOME EXERCISE PROGRAM:   GOALS: Goals reviewed with patient? Yes  SHORT TERM GOALS: Target date: 04/15/23  Patient will be independent with his initial HEP.  Baseline: Goal status: INITIAL  2.  Patient will improve his timed up and go time to 15 seconds or less for improved functional mobility.  Baseline:  Goal status: INITIAL  LONG TERM GOALS: Target date: 05/06/23  Patient will be independent with his advanced HEP.  Baseline:  Goal status: INITIAL  2.  Patient will improve his timed up and go time to 12 seconds or less for to reduce his fall risk.   Baseline:  Goal status: INITIAL  3.  Patient will improve his five time sit to stand time to 12 seconds or less to reduce his fall risk.  Baseline:  Goal status: INITIAL  4.  Patient  will be able to navigate at least 3 steps with a reciprocal pattern for improved household mobility.  Baseline:  Goal status: INITIAL  ASSESSMENT:  CLINICAL IMPRESSION: Patient was introduced to standing hip abduction and other new interventions for improved lower extremity stability. He required minimal verbal and tactile cueing with standing hip abduction to promote trunk stability with lower extremity mobility. He fatigued quickly with today's interventions as he required multiple rest breaks throughout treatment. He reported feeling alright upon the conclusion of treatment. He continues to require skilled physical therapy to address his remaining impairments  to maximize his functional mobility.   OBJECTIVE IMPAIRMENTS: Abnormal gait, decreased activity tolerance, decreased balance, decreased mobility, difficulty walking, decreased strength, and postural dysfunction.   ACTIVITY LIMITATIONS: carrying, lifting, standing, stairs, transfers, and locomotion level  PARTICIPATION LIMITATIONS: shopping, community activity, and yard work  PERSONAL FACTORS: Time since onset of injury/illness/exacerbation, Transportation, and 3+ comorbidities: epilepsy, seizure disorder, rheumatoid arthritis, depression, asthma, and hard of hearing  are also affecting patient's functional outcome.   REHAB POTENTIAL: Good  CLINICAL DECISION MAKING: Evolving/moderate complexity  EVALUATION COMPLEXITY: Moderate  PLAN:  PT FREQUENCY: 2x/week  PT DURATION: 6 weeks  PLANNED INTERVENTIONS: Therapeutic exercises, Therapeutic activity, Neuromuscular re-education, Balance training, Gait training, Patient/Family education, Self Care, Stair training, and Re-evaluation  PLAN FOR NEXT SESSION: Nustep, lower extremity strengthening, balance interventions, and gait training   Granville Lewis, PT 04/09/2023, 3:44 PM

## 2023-04-11 ENCOUNTER — Ambulatory Visit: Payer: Medicare PPO

## 2023-04-11 DIAGNOSIS — R2681 Unsteadiness on feet: Secondary | ICD-10-CM | POA: Diagnosis not present

## 2023-04-11 DIAGNOSIS — M6281 Muscle weakness (generalized): Secondary | ICD-10-CM

## 2023-04-11 NOTE — Therapy (Signed)
OUTPATIENT PHYSICAL THERAPY NEURO TREATMENT   Patient Name: Troy Rodgers. MRN: [id number] DOB:16-Jul-1945, 77 y.o., male 18 Date: 04/11/2023   PCP: Dettinger, Elige Radon, MD REFERRING PROVIDER: Rema Jasmine, MD   END OF SESSION:  PT End of Session - 04/11/23 1037     Visit Number 6    Number of Visits 12    Date for PT Re-Evaluation 06/13/23    PT Start Time 1017    PT Stop Time 1100    PT Time Calculation (min) 43 min    Activity Tolerance Patient tolerated treatment well    Behavior During Therapy WFL for tasks assessed/performed                 Past Medical History:  Diagnosis Date   Abdominal pain    Arthritis    rheumatoid    Asthma    Bradycardia    Cataract    Chest pain    Cholelithiases    Depressive disorder, not elsewhere classified    GERD (gastroesophageal reflux disease)    Hearing deficit, bilateral    HLD (hyperlipidemia)    Hyperplasia of prostate    Impotence of organic origin    Kidney stone    Localization-related (focal) (partial) epilepsy and epileptic syndromes with complex partial seizures, without mention of intractable epilepsy    Nasal septal perforation    Other and unspecified hyperlipidemia    Seizures (HCC)    Thyroid disease    Past Surgical History:  Procedure Laterality Date   CHOLECYSTECTOMY  04/28/2013   Procedure: LAPAROSCOPIC CHOLECYSTECTOMY;  Surgeon: Axel Filler, MD;  Location: [facility] OR;  Service: General;;   LEFT SHOULDER SURGERY  1996   Patient Active Problem List   Diagnosis Date Noted   Cerebellar gait 02/26/2021   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 (HCC) 02/26/2021   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 02/26/2021   Polyarthropathy 02/26/2021   GERD (gastroesophageal reflux disease) 04/16/2019   Hypothyroidism 05/04/2014   Solitary pulmonary nodule RLL lateral basal segment  10/12/2013   Rheumatoid arthritis (HCC) 08/19/2013   High risk  medication use 08/19/2013   BPH (benign prostatic hyperplasia) 08/19/2013   Seizure disorder (HCC) 05/06/2013   Hyperlipidemia    Depression, recurrent (HCC)    Localization-related (focal) (partial) epilepsy and epileptic syndromes with complex partial seizures, without mention of intractable epilepsy    Kidney stone    Impotence of organic origin    Nasal septal perforation    Hyperplasia of prostate     ONSET DATE: years ago, but it has slowly gotten worse  REFERRING DIAG: Gait & Balance Training-Polyneropathy with gait & balance impairment   THERAPY DIAG:  Unsteadiness on feet  Muscle weakness (generalized)  Rationale for Evaluation and Treatment: Rehabilitation  SUBJECTIVE:  SUBJECTIVE STATEMENT: Patient reports that he feels good today.  Pt accompanied by: significant other  PERTINENT HISTORY: epilepsy, seizure disorder, rheumatoid arthritis, depression, asthma, and hard of hearing  PAIN:  Are you having pain? No  PRECAUTIONS: Fall  RED FLAGS: None   WEIGHT BEARING RESTRICTIONS: No  FALLS: Has patient fallen in last 6 months? Yes. Number of falls 1  LIVING ENVIRONMENT: Lives with: lives with their spouse Lives in: House/apartment Stairs: Yes: External: 2-3 steps; can reach both Has following equipment at home: Single point cane  PLOF: Independent with basic ADLs  PATIENT GOALS: improved balance, safety, and mobility (be able to walk with his cane)   OBJECTIVE: all objective measures were assessed at his initial evaluation on 03/25/23 unless otherwise noted  COGNITION: Overall cognitive status: Within functional limits for tasks assessed   SENSATION: Light touch: equal sensation bilaterally with no deficits noted  COORDINATION: No deficits observed  EDEMA:  No  edema observed  POSTURE: rounded shoulders, forward head, and flexed trunk   LOWER EXTREMITY ROM: WFL for activities assessed  LOWER EXTREMITY MMT:    MMT Right Eval Left Eval  Hip flexion 4/5 4/5  Hip extension    Hip abduction    Hip adduction    Hip internal rotation    Hip external rotation    Knee flexion 4+/5 5/5  Knee extension 4/5 4+/5  Ankle dorsiflexion 4-/5 4-/5  Ankle plantarflexion    Ankle inversion    Ankle eversion    (Blank rows = not tested)  TRANSFERS: Assistive device utilized: Single point cane  Sit to stand: Complete Independence Stand to sit: Complete Independence  GAIT: Gait pattern: step through pattern, decreased stride length, trunk flexed, wide BOS, poor foot clearance- Right, and poor foot clearance- Left Assistive device utilized: Single point cane Level of assistance: Modified independence  FUNCTIONAL TESTS:  5 times sit to stand: 17.98 seconds without UE support Timed up and go (TUG): 19.99 seconds with SPC  BALANCE:   Narrow BOS, firm surface, eyes open and closed: 30 seconds each  Tandem, firm surface, right foot leading: 10 seconds Tandem, firm surface, left foot leading: 4 seconds  TODAY'S TREATMENT:                                                                                                                              DATE:                                     04/11/23 EXERCISE LOG  Exercise Repetitions and Resistance Comments  Nustep  L5 x 20 minutes   LAQ 5# x 3 minutes Alternating LE   Seated marching 5# x 3 minutes  Alternating LE   Standing toe raise  2 minutes    Sit to stand  25 reps  From lowered mat table  Static stance on foam  3 minutes  Intermittent UE support  Step up  6" step x 20 reps each     Blank cell = exercise not performed today                                    04/09/23 EXERCISE LOG  Exercise Repetitions and Resistance Comments  Nustep  L5 x 19 minutes    Marching on foam  2.5 minutes    Standing hip ABD 2.5 minutes  Alternating LE   LAQ 5# x 2.5 minutes  Alternating LE   Seated marching  5# x 2.5 minutes  Alternating LE  Seated heel/toe raise  20 reps each    Supine hip flexor stretch  1.5 minutes     Blank cell = exercise not performed today                                    04/03/23 EXERCISE LOG  Exercise Repetitions and Resistance Comments  Nustep  L4 x 15 minutes    Rocker board  2.5 minutes  BUE support   Marching on foam  2.5 minutes  BUE support   Static stance on foam  2 minutes  Intermittent UE support  LAQ 4# x 3 minutes  Alternating LE   Seated marching  4# x 3 minutes   Seated hip ADD isometric  15 reps w/ 5 second hold    Standing HS curl  2 minutes  Alternating LE   Sit to stand  10 reps     Blank cell = exercise not performed today   PATIENT EDUCATION: Education details: POC, balance, transfers, mobility, safety, and goal for therapy Person educated: Patient and Spouse Education method: Explanation Education comprehension: verbalized understanding  HOME EXERCISE PROGRAM:   GOALS: Goals reviewed with patient? Yes  SHORT TERM GOALS: Target date: 04/15/23  Patient will be independent with his initial HEP.  Baseline: Goal status: INITIAL  2.  Patient will improve his timed up and go time to 15 seconds or less for improved functional mobility.  Baseline:  Goal status: INITIAL  LONG TERM GOALS: Target date: 05/06/23  Patient will be independent with his advanced HEP.  Baseline:  Goal status: INITIAL  2.  Patient will improve his timed up and go time to 12 seconds or less for to reduce his fall risk.   Baseline:  Goal status: INITIAL  3.  Patient will improve his five time sit to stand time to 12 seconds or less to reduce his fall risk.  Baseline:  Goal status: INITIAL  4.  Patient will be able to navigate at least 3 steps with a reciprocal pattern for improved household mobility.  Baseline:  Goal status:  INITIAL  ASSESSMENT:  CLINICAL IMPRESSION: Patient was progressed with step ups in addition to familiar interventions for improved lower extremity strength and stability. He required minimal cueing with step ups to facilitate leading with the right lower extremity as he favored utilizing his left lower extremity. He reported feeling tired, but good upon the conclusion of treatment. He continues to require skilled physical therapy to address his remaining impairments to maximize his safety and functional mobility.   OBJECTIVE IMPAIRMENTS: Abnormal gait, decreased activity tolerance, decreased balance, decreased mobility, difficulty walking, decreased strength, and postural dysfunction.   ACTIVITY LIMITATIONS: carrying, lifting, standing, stairs, transfers, and locomotion  level  PARTICIPATION LIMITATIONS: shopping, community activity, and yard work  PERSONAL FACTORS: Time since onset of injury/illness/exacerbation, Transportation, and 3+ comorbidities: epilepsy, seizure disorder, rheumatoid arthritis, depression, asthma, and hard of hearing  are also affecting patient's functional outcome.   REHAB POTENTIAL: Good  CLINICAL DECISION MAKING: Evolving/moderate complexity  EVALUATION COMPLEXITY: Moderate  PLAN:  PT FREQUENCY: 2x/week  PT DURATION: 6 weeks  PLANNED INTERVENTIONS: Therapeutic exercises, Therapeutic activity, Neuromuscular re-education, Balance training, Gait training, Patient/Family education, Self Care, Stair training, and Re-evaluation  PLAN FOR NEXT SESSION: Nustep, lower extremity strengthening, balance interventions, and gait training   Granville Lewis, PT 04/11/2023, 12:13 PM

## 2023-04-14 ENCOUNTER — Ambulatory Visit: Payer: Medicare PPO

## 2023-04-14 DIAGNOSIS — R2681 Unsteadiness on feet: Secondary | ICD-10-CM | POA: Diagnosis not present

## 2023-04-14 DIAGNOSIS — M6281 Muscle weakness (generalized): Secondary | ICD-10-CM | POA: Diagnosis not present

## 2023-04-14 NOTE — Therapy (Signed)
OUTPATIENT PHYSICAL THERAPY NEURO TREATMENT   Patient Name: Troy Rodgers. MRN: [id number] DOB:Dec 20, 1945, 77 y.o., male 67 Date: 04/14/2023   PCP: Dettinger, Elige Radon, MD REFERRING PROVIDER: Rema Jasmine, MD   END OF SESSION:  PT End of Session - 04/14/23 1110     Visit Number 7    Number of Visits 12    Date for PT Re-Evaluation 06/13/23    PT Start Time 1100    PT Stop Time 1145    PT Time Calculation (min) 45 min    Activity Tolerance Patient tolerated treatment well    Behavior During Therapy WFL for tasks assessed/performed                 Past Medical History:  Diagnosis Date   Abdominal pain    Arthritis    rheumatoid    Asthma    Bradycardia    Cataract    Chest pain    Cholelithiases    Depressive disorder, not elsewhere classified    GERD (gastroesophageal reflux disease)    Hearing deficit, bilateral    HLD (hyperlipidemia)    Hyperplasia of prostate    Impotence of organic origin    Kidney stone    Localization-related (focal) (partial) epilepsy and epileptic syndromes with complex partial seizures, without mention of intractable epilepsy    Nasal septal perforation    Other and unspecified hyperlipidemia    Seizures (HCC)    Thyroid disease    Past Surgical History:  Procedure Laterality Date   CHOLECYSTECTOMY  04/28/2013   Procedure: LAPAROSCOPIC CHOLECYSTECTOMY;  Surgeon: Axel Filler, MD;  Location: [facility] OR;  Service: General;;   LEFT SHOULDER SURGERY  1996   Patient Active Problem List   Diagnosis Date Noted   Cerebellar gait 02/26/2021   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 (HCC) 02/26/2021   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 02/26/2021   Polyarthropathy 02/26/2021   GERD (gastroesophageal reflux disease) 04/16/2019   Hypothyroidism 05/04/2014   Solitary pulmonary nodule RLL lateral basal segment  10/12/2013   Rheumatoid arthritis (HCC) 08/19/2013   High risk  medication use 08/19/2013   BPH (benign prostatic hyperplasia) 08/19/2013   Seizure disorder (HCC) 05/06/2013   Hyperlipidemia    Depression, recurrent (HCC)    Localization-related (focal) (partial) epilepsy and epileptic syndromes with complex partial seizures, without mention of intractable epilepsy    Kidney stone    Impotence of organic origin    Nasal septal perforation    Hyperplasia of prostate     ONSET DATE: years ago, but it has slowly gotten worse  REFERRING DIAG: Gait & Balance Training-Polyneropathy with gait & balance impairment   THERAPY DIAG:  Unsteadiness on feet  Muscle weakness (generalized)  Rationale for Evaluation and Treatment: Rehabilitation  SUBJECTIVE:  SUBJECTIVE STATEMENT: Patient reports that he feels good today.  Pt accompanied by: significant other  PERTINENT HISTORY: epilepsy, seizure disorder, rheumatoid arthritis, depression, asthma, and hard of hearing  PAIN:  Are you having pain? No  PRECAUTIONS: Fall  RED FLAGS: None   WEIGHT BEARING RESTRICTIONS: No  FALLS: Has patient fallen in last 6 months? Yes. Number of falls 1  LIVING ENVIRONMENT: Lives with: lives with their spouse Lives in: House/apartment Stairs: Yes: External: 2-3 steps; can reach both Has following equipment at home: Single point cane  PLOF: Independent with basic ADLs  PATIENT GOALS: improved balance, safety, and mobility (be able to walk with his cane)   OBJECTIVE: all objective measures were assessed at his initial evaluation on 03/25/23 unless otherwise noted  COGNITION: Overall cognitive status: Within functional limits for tasks assessed   SENSATION: Light touch: equal sensation bilaterally with no deficits noted  COORDINATION: No deficits observed  EDEMA:  No  edema observed  POSTURE: rounded shoulders, forward head, and flexed trunk   LOWER EXTREMITY ROM: WFL for activities assessed  LOWER EXTREMITY MMT:    MMT Right Eval Left Eval  Hip flexion 4/5 4/5  Hip extension    Hip abduction    Hip adduction    Hip internal rotation    Hip external rotation    Knee flexion 4+/5 5/5  Knee extension 4/5 4+/5  Ankle dorsiflexion 4-/5 4-/5  Ankle plantarflexion    Ankle inversion    Ankle eversion    (Blank rows = not tested)  TRANSFERS: Assistive device utilized: Single point cane  Sit to stand: Complete Independence Stand to sit: Complete Independence  GAIT: Gait pattern: step through pattern, decreased stride length, trunk flexed, wide BOS, poor foot clearance- Right, and poor foot clearance- Left Assistive device utilized: Single point cane Level of assistance: Modified independence  FUNCTIONAL TESTS:  5 times sit to stand: 17.98 seconds without UE support Timed up and go (TUG): 19.99 seconds with SPC  BALANCE:   Narrow BOS, firm surface, eyes open and closed: 30 seconds each  Tandem, firm surface, right foot leading: 10 seconds Tandem, firm surface, left foot leading: 4 seconds  TODAY'S TREATMENT:                                                                                                                              DATE:                                     04/14/23 EXERCISE LOG  Exercise Repetitions and Resistance Comments  Nustep  L5 x 20 minutes   LAQ 6# x 2 minutes Alternating LE   Seated marching 6# x 2.5 minutes  Alternating LE   Standing toe raise  2.5 minutes    Sit to stand  25 reps  From lowered mat table  Side-stepping Airex x 3 minutes  Intermittent UE support  Seated [place] x 25 reps bil   Step up  6" step x 20 reps each     Blank cell = exercise not performed today                                    04/09/23 EXERCISE LOG  Exercise Repetitions and Resistance Comments  Nustep  L5 x 19 minutes     Marching on foam  2.5 minutes   Standing hip ABD 2.5 minutes  Alternating LE   LAQ 5# x 2.5 minutes  Alternating LE   Seated marching  5# x 2.5 minutes  Alternating LE  Seated heel/toe raise  20 reps each    Supine hip flexor stretch  1.5 minutes     Blank cell = exercise not performed today                                    PATIENT EDUCATION: Education details: POC, balance, transfers, mobility, safety, and goal for therapy Person educated: Patient and Spouse Education method: Explanation Education comprehension: verbalized understanding  HOME EXERCISE PROGRAM:   GOALS: Goals reviewed with patient? Yes  SHORT TERM GOALS: Target date: 04/15/23  Patient will be independent with his initial HEP.  Baseline: Goal status: INITIAL  2.  Patient will improve his timed up and go time to 15 seconds or less for improved functional mobility.  Baseline:  Goal status: INITIAL  LONG TERM GOALS: Target date: 05/06/23  Patient will be independent with his advanced HEP.  Baseline:  Goal status: INITIAL  2.  Patient will improve his timed up and go time to 12 seconds or less for to reduce his fall risk.   Baseline:  Goal status: INITIAL  3.  Patient will improve his five time sit to stand time to 12 seconds or less to reduce his fall risk.  Baseline:  Goal status: INITIAL  4.  Patient will be able to navigate at least 3 steps with a reciprocal pattern for improved household mobility.  Baseline:  Goal status: INITIAL  ASSESSMENT:  CLINICAL IMPRESSION: Pt arrives for today's treatment session denying any pain. Pt introduced to side-stepping on Airex balance beam with BUE support required throughout for safety and stability.  Pt able to tolerate increased resistance or time with all previously performed exercises.  Pt requiring seated rest breaks during sit to stands due to fatigue.  Pt denied any pain at completion of today's treatment session.  OBJECTIVE IMPAIRMENTS: Abnormal  gait, decreased activity tolerance, decreased balance, decreased mobility, difficulty walking, decreased strength, and postural dysfunction.   ACTIVITY LIMITATIONS: carrying, lifting, standing, stairs, transfers, and locomotion level  PARTICIPATION LIMITATIONS: shopping, community activity, and yard work  PERSONAL FACTORS: Time since onset of injury/illness/exacerbation, Transportation, and 3+ comorbidities: epilepsy, seizure disorder, rheumatoid arthritis, depression, asthma, and hard of hearing  are also affecting patient's functional outcome.   REHAB POTENTIAL: Good  CLINICAL DECISION MAKING: Evolving/moderate complexity  EVALUATION COMPLEXITY: Moderate  PLAN:  PT FREQUENCY: 2x/week  PT DURATION: 6 weeks  PLANNED INTERVENTIONS: Therapeutic exercises, Therapeutic activity, Neuromuscular re-education, Balance training, Gait training, Patient/Family education, Self Care, Stair training, and Re-evaluation  PLAN FOR NEXT SESSION: Nustep, lower extremity strengthening, balance interventions, and gait training   Newman Pies, PTA 04/14/2023, 11:59 AM

## 2023-04-17 ENCOUNTER — Ambulatory Visit: Payer: Medicare PPO | Attending: Neurology

## 2023-04-17 DIAGNOSIS — M6281 Muscle weakness (generalized): Secondary | ICD-10-CM | POA: Insufficient documentation

## 2023-04-17 DIAGNOSIS — R2681 Unsteadiness on feet: Secondary | ICD-10-CM | POA: Diagnosis not present

## 2023-04-17 NOTE — Therapy (Addendum)
OUTPATIENT PHYSICAL THERAPY NEURO TREATMENT   Patient Name: Troy Rodgers. MRN: [id number] DOB:1945-09-11, 77 y.o., male 67 Date: 04/17/2023   PCP: Dettinger, Elige Radon, MD REFERRING PROVIDER: Rema Jasmine, MD   END OF SESSION:  PT End of Session - 04/17/23 1108     Visit Number 8    Number of Visits 12    Date for PT Re-Evaluation 06/13/23    PT Start Time 1100    PT Stop Time 1146    PT Time Calculation (min) 46 min    Activity Tolerance Patient tolerated treatment well    Behavior During Therapy WFL for tasks assessed/performed                 Past Medical History:  Diagnosis Date   Abdominal pain    Arthritis    rheumatoid    Asthma    Bradycardia    Cataract    Chest pain    Cholelithiases    Depressive disorder, not elsewhere classified    GERD (gastroesophageal reflux disease)    Hearing deficit, bilateral    HLD (hyperlipidemia)    Hyperplasia of prostate    Impotence of organic origin    Kidney stone    Localization-related (focal) (partial) epilepsy and epileptic syndromes with complex partial seizures, without mention of intractable epilepsy    Nasal septal perforation    Other and unspecified hyperlipidemia    Seizures (HCC)    Thyroid disease    Past Surgical History:  Procedure Laterality Date   CHOLECYSTECTOMY  04/28/2013   Procedure: LAPAROSCOPIC CHOLECYSTECTOMY;  Surgeon: Axel Filler, MD;  Location: [facility] OR;  Service: General;;   LEFT SHOULDER SURGERY  1996   Patient Active Problem List   Diagnosis Date Noted   Cerebellar gait 02/26/2021   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 (HCC) 02/26/2021   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 02/26/2021   Polyarthropathy 02/26/2021   GERD (gastroesophageal reflux disease) 04/16/2019   Hypothyroidism 05/04/2014   Solitary pulmonary nodule RLL lateral basal segment  10/12/2013   Rheumatoid arthritis (HCC) 08/19/2013   High risk  medication use 08/19/2013   BPH (benign prostatic hyperplasia) 08/19/2013   Seizure disorder (HCC) 05/06/2013   Hyperlipidemia    Depression, recurrent (HCC)    Focal epilepsy with impairment of consciousness (HCC)    Kidney stone    Impotence of organic origin    Nasal septal perforation    Hyperplasia of prostate     ONSET DATE: years ago, but it has slowly gotten worse  REFERRING DIAG: Gait & Balance Training-Polyneropathy with gait & balance impairment   THERAPY DIAG:  Unsteadiness on feet  Muscle weakness (generalized)  Rationale for Evaluation and Treatment: Rehabilitation  SUBJECTIVE:  SUBJECTIVE STATEMENT: Patient reports that he feels good today.  Pt accompanied by: significant other  PERTINENT HISTORY: epilepsy, seizure disorder, rheumatoid arthritis, depression, asthma, and hard of hearing  PAIN:  Are you having pain? No  PRECAUTIONS: Fall  RED FLAGS: None   WEIGHT BEARING RESTRICTIONS: No  FALLS: Has patient fallen in last 6 months? Yes. Number of falls 1  LIVING ENVIRONMENT: Lives with: lives with their spouse Lives in: House/apartment Stairs: Yes: External: 2-3 steps; can reach both Has following equipment at home: Single point cane  PLOF: Independent with basic ADLs  PATIENT GOALS: improved balance, safety, and mobility (be able to walk with his cane)   OBJECTIVE: all objective measures were assessed at his initial evaluation on 03/25/23 unless otherwise noted  COGNITION: Overall cognitive status: Within functional limits for tasks assessed   SENSATION: Light touch: equal sensation bilaterally with no deficits noted  COORDINATION: No deficits observed  EDEMA:  No edema observed  POSTURE: rounded shoulders, forward head, and flexed trunk   LOWER  EXTREMITY ROM: WFL for activities assessed  LOWER EXTREMITY MMT:    MMT Right Eval Left Eval  Hip flexion 4/5 4/5  Hip extension    Hip abduction    Hip adduction    Hip internal rotation    Hip external rotation    Knee flexion 4+/5 5/5  Knee extension 4/5 4+/5  Ankle dorsiflexion 4-/5 4-/5  Ankle plantarflexion    Ankle inversion    Ankle eversion    (Blank rows = not tested)  TRANSFERS: Assistive device utilized: Single point cane  Sit to stand: Complete Independence Stand to sit: Complete Independence  GAIT: Gait pattern: step through pattern, decreased stride length, trunk flexed, wide BOS, poor foot clearance- Right, and poor foot clearance- Left Assistive device utilized: Single point cane Level of assistance: Modified independence  FUNCTIONAL TESTS:  5 times sit to stand: 17.98 seconds without UE support Timed up and go (TUG): 19.99 seconds with SPC  BALANCE:   Narrow BOS, firm surface, eyes open and closed: 30 seconds each  Tandem, firm surface, right foot leading: 10 seconds Tandem, firm surface, left foot leading: 4 seconds  TODAY'S TREATMENT:                                                                                                                              DATE:                                     04/17/23 EXERCISE LOG  Exercise Repetitions and Resistance Comments  Nustep  L5 x 20 minutes   Standing  marching 2# x 20 reps bil   Standing hip abduction 2# x 20 reps bil   Standing ham curls 2# x 20 reps bil   Rockerboard 4 mins   LAQ 7.5# x 2 minutes Alternating LE   Seated marching  Alternating LE   Standing toe raise     Sit to stand   From lowered mat table  Side-stepping  Intermittent UE support  Seated Ham Curls    Step up      Blank cell = exercise not performed today                                    04/09/23 EXERCISE LOG  Exercise Repetitions and Resistance Comments  Nustep  L5 x 19 minutes    Marching on foam  2.5 minutes    Standing hip ABD 2.5 minutes  Alternating LE   LAQ 5# x 2.5 minutes  Alternating LE   Seated marching  5# x 2.5 minutes  Alternating LE  Seated heel/toe raise  20 reps each    Supine hip flexor stretch  1.5 minutes     Blank cell = exercise not performed today                                    PATIENT EDUCATION: Education details: POC, balance, transfers, mobility, safety, and goal for therapy Person educated: Patient and Spouse Education method: Explanation Education comprehension: verbalized understanding  HOME EXERCISE PROGRAM:   GOALS: Goals reviewed with patient? Yes  SHORT TERM GOALS: Target date: 04/15/23  Patient will be independent with his initial HEP.  Baseline: Goal status: MET  2.  Patient will improve his timed up and go time to 15 seconds or less for improved functional mobility.  Baseline: 10/3: 13.7 seconds Goal status: MET  LONG TERM GOALS: Target date: 05/06/23  Patient will be independent with his advanced HEP.  Baseline:  Goal status: IN PROGRESS  2.  Patient will improve his timed up and go time to 12 seconds or less for to reduce his fall risk.   Baseline: 13.7 seconds  Goal status: IN PROGRESS  3.  Patient will improve his five time sit to stand time to 12 seconds or less to reduce his fall risk.  Baseline: 10/3: 10.5 seconds Goal status: MET  4.  Patient will be able to navigate at least 3 steps with a reciprocal pattern for improved household mobility.  Baseline:  Goal status: MET  ASSESSMENT:  CLINICAL IMPRESSION: Pt arrives for today's treatment session denying any pain.  Pt able to perform TUG in 13.7 seconds meeting his short term goal and making good progress toward his long term goal.  Pt able to perform TUG without use of AD, demonstrating increased independence.  Pt able to perform 5 STS test in 10.5 seconds without use of UE support.  Pt able to navigate four steps with reciprocal patter and use of one hand rail safely.  Pt  introduced to standing, resisted BLE exercises without issue.  Pt requiring min cues for proper technique and posture.  Pt able to tolerate increased weight with seated LAQ today without only fatigue reported.  Pt is making good progress towards all of his goals at this time.  Pt denied any pain at completion of today's treatment session.  04/17/23 PROGRESS REPORT:  Patient is making good progress with skilled physical therapy as evidenced by his objective measures, functional mobility, and progress toward his goals. He was able to meet all of his short term and some of his long term goals for therapy. However, he has yet to  meet his long term goal for a timed up and go in under 12 seconds (13.7 seconds today). Recommend that he continue with his current plan of care to address his remaining impairments to maximize his safety and functional mobility.   Candi Leash, PT, DPT   OBJECTIVE IMPAIRMENTS: Abnormal gait, decreased activity tolerance, decreased balance, decreased mobility, difficulty walking, decreased strength, and postural dysfunction.   ACTIVITY LIMITATIONS: carrying, lifting, standing, stairs, transfers, and locomotion level  PARTICIPATION LIMITATIONS: shopping, community activity, and yard work  PERSONAL FACTORS: Time since onset of injury/illness/exacerbation, Transportation, and 3+ comorbidities: epilepsy, seizure disorder, rheumatoid arthritis, depression, asthma, and hard of hearing  are also affecting patient's functional outcome.   REHAB POTENTIAL: Good  CLINICAL DECISION MAKING: Evolving/moderate complexity  EVALUATION COMPLEXITY: Moderate  PLAN:  PT FREQUENCY: 2x/week  PT DURATION: 6 weeks  PLANNED INTERVENTIONS: Therapeutic exercises, Therapeutic activity, Neuromuscular re-education, Balance training, Gait training, Patient/Family education, Self Care, Stair training, and Re-evaluation  PLAN FOR NEXT SESSION: Nustep, lower extremity strengthening, balance  interventions, and gait training   Newman Pies, PTA 04/17/2023, 11:48 AM

## 2023-04-22 ENCOUNTER — Encounter: Payer: Self-pay | Admitting: *Deleted

## 2023-04-22 ENCOUNTER — Ambulatory Visit: Payer: Medicare PPO | Admitting: *Deleted

## 2023-04-22 DIAGNOSIS — M6281 Muscle weakness (generalized): Secondary | ICD-10-CM

## 2023-04-22 DIAGNOSIS — R2681 Unsteadiness on feet: Secondary | ICD-10-CM

## 2023-04-22 NOTE — Therapy (Signed)
OUTPATIENT PHYSICAL THERAPY NEURO TREATMENT   Patient Name: Troy Rodgers. MRN: [id number] DOB:04/22/1946, 77 y.o., male 67 Date: 04/22/2023   PCP: Dettinger, Elige Radon, MD REFERRING PROVIDER: Rema Jasmine, MD   END OF SESSION:  PT End of Session - 04/22/23 1117     Visit Number 9    Number of Visits 12    Date for PT Re-Evaluation 06/13/23    PT Start Time 1100    PT Stop Time 1046    PT Time Calculation (min) 1426 min                 Past Medical History:  Diagnosis Date   Abdominal pain    Arthritis    rheumatoid    Asthma    Bradycardia    Cataract    Chest pain    Cholelithiases    Depressive disorder, not elsewhere classified    GERD (gastroesophageal reflux disease)    Hearing deficit, bilateral    HLD (hyperlipidemia)    Hyperplasia of prostate    Impotence of organic origin    Kidney stone    Localization-related (focal) (partial) epilepsy and epileptic syndromes with complex partial seizures, without mention of intractable epilepsy    Nasal septal perforation    Other and unspecified hyperlipidemia    Seizures (HCC)    Thyroid disease    Past Surgical History:  Procedure Laterality Date   CHOLECYSTECTOMY  04/28/2013   Procedure: LAPAROSCOPIC CHOLECYSTECTOMY;  Surgeon: Axel Filler, MD;  Location: [facility] OR;  Service: General;;   LEFT SHOULDER SURGERY  1996   Patient Active Problem List   Diagnosis Date Noted   Cerebellar gait 02/26/2021   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 (HCC) 02/26/2021   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 02/26/2021   Polyarthropathy 02/26/2021   GERD (gastroesophageal reflux disease) 04/16/2019   Hypothyroidism 05/04/2014   Solitary pulmonary nodule RLL lateral basal segment  10/12/2013   Rheumatoid arthritis (HCC) 08/19/2013   High risk medication use 08/19/2013   BPH (benign prostatic hyperplasia) 08/19/2013   Seizure disorder (HCC) 05/06/2013    Hyperlipidemia    Depression, recurrent (HCC)    Focal epilepsy with impairment of consciousness (HCC)    Kidney stone    Impotence of organic origin    Nasal septal perforation    Hyperplasia of prostate     ONSET DATE: years ago, but it has slowly gotten worse  REFERRING DIAG: Gait & Balance Training-Polyneropathy with gait & balance impairment   THERAPY DIAG:  Unsteadiness on feet  Muscle weakness (generalized)  Rationale for Evaluation and Treatment: Rehabilitation  SUBJECTIVE:  SUBJECTIVE STATEMENT: Patient reports doing good after last Rx  Pt accompanied by: significant other  PERTINENT HISTORY: epilepsy, seizure disorder, rheumatoid arthritis, depression, asthma, and hard of hearing  PAIN:  Are you having pain? No  PRECAUTIONS: Fall  RED FLAGS: None   WEIGHT BEARING RESTRICTIONS: No  FALLS: Has patient fallen in last 6 months? Yes. Number of falls 1  LIVING ENVIRONMENT: Lives with: lives with their spouse Lives in: House/apartment Stairs: Yes: External: 2-3 steps; can reach both Has following equipment at home: Single point cane  PLOF: Independent with basic ADLs  PATIENT GOALS: improved balance, safety, and mobility (be able to walk with his cane)   OBJECTIVE: all objective measures were assessed at his initial evaluation on 03/25/23 unless otherwise noted  COGNITION: Overall cognitive status: Within functional limits for tasks assessed   SENSATION: Light touch: equal sensation bilaterally with no deficits noted  COORDINATION: No deficits observed  EDEMA:  No edema observed  POSTURE: rounded shoulders, forward head, and flexed trunk   LOWER EXTREMITY ROM: WFL for activities assessed  LOWER EXTREMITY MMT:    MMT Right Eval Left Eval  Hip flexion 4/5 4/5   Hip extension    Hip abduction    Hip adduction    Hip internal rotation    Hip external rotation    Knee flexion 4+/5 5/5  Knee extension 4/5 4+/5  Ankle dorsiflexion 4-/5 4-/5  Ankle plantarflexion    Ankle inversion    Ankle eversion    (Blank rows = not tested)  TRANSFERS: Assistive device utilized: Single point cane  Sit to stand: Complete Independence Stand to sit: Complete Independence  GAIT: Gait pattern: step through pattern, decreased stride length, trunk flexed, wide BOS, poor foot clearance- Right, and poor foot clearance- Left Assistive device utilized: Single point cane Level of assistance: Modified independence  FUNCTIONAL TESTS:  5 times sit to stand: 17.98 seconds without UE support Timed up and go (TUG): 19.99 seconds with SPC  BALANCE:   Narrow BOS, firm surface, eyes open and closed: 30 seconds each  Tandem, firm surface, right foot leading: 10 seconds Tandem, firm surface, left foot leading: 4 seconds  TODAY'S TREATMENT:                                                                                                                              DATE:                                     04/22/23 EXERCISE LOG  Exercise Repetitions and Resistance Comments  Nustep  L5 x 18 minutes   Standing  marching 2# x 20 reps bil   Standing hip abduction 2# x 20 reps bil   Standing ham curls 2# x 20 reps bil   Rockerboard 4 mins   balance   Balance pad Tandem stance x 4 mins total  LAQ 7.5#  3x10 Bil.                Alternating LE   Seated marching  Alternating LE   Standing toe raise     Sit to stand   From lowered mat table  Side-stepping  Intermittent UE support  Seated Ham Curls    Step up      Blank cell = exercise not performed today                                    04/09/23 EXERCISE LOG  Exercise Repetitions and Resistance Comments  Nustep  L5 x 19 minutes    Marching on foam  2.5 minutes   Standing hip ABD 2.5 minutes  Alternating LE   LAQ 5#  x 2.5 minutes  Alternating LE   Seated marching  5# x 2.5 minutes  Alternating LE  Seated heel/toe raise  20 reps each    Supine hip flexor stretch  1.5 minutes     Blank cell = exercise not performed today                                    PATIENT EDUCATION: Education details: POC, balance, transfers, mobility, safety, and goal for therapy Person educated: Patient and Spouse Education method: Explanation Education comprehension: verbalized understanding  HOME EXERCISE PROGRAM:   GOALS: Goals reviewed with patient? Yes  SHORT TERM GOALS: Target date: 04/15/23  Patient will be independent with his initial HEP.  Baseline: Goal status: MET  2.  Patient will improve his timed up and go time to 15 seconds or less for improved functional mobility.  Baseline: 10/3: 13.7 seconds Goal status: MET  LONG TERM GOALS: Target date: 05/06/23  Patient will be independent with his advanced HEP.  Baseline:  Goal status: IN PROGRESS  2.  Patient will improve his timed up and go time to 12 seconds or less for to reduce his fall risk.   Baseline: 13.7 seconds  Goal status: IN PROGRESS  3.  Patient will improve his five time sit to stand time to 12 seconds or less to reduce his fall risk.  Baseline: 10/3: 10.5 seconds Goal status: MET  4.  Patient will be able to navigate at least 3 steps with a reciprocal pattern for improved household mobility.  Baseline:  Goal status: MET  ASSESSMENT:  CLINICAL IMPRESSION:   Pt arrived today doing fairly well and was able to continue with LE strengthening exs and balance Act's. with decreased need of assist with balance today. Check goals next visit. 04/17/23 PROGRESS REPORT:  Patient is making good progress with skilled physical therapy as evidenced by his objective measures, functional mobility, and progress toward his goals. He was able to meet all of his short term and some of his long term goals for therapy. However, he has yet to meet his long  term goal for a timed up and go in under 12 seconds (13.7 seconds today). Recommend that he continue with his current plan of care to address his remaining impairments to maximize his safety and functional mobility.   Candi Leash, PT, DPT   OBJECTIVE IMPAIRMENTS: Abnormal gait, decreased activity tolerance, decreased balance, decreased mobility, difficulty walking, decreased strength, and postural dysfunction.   ACTIVITY LIMITATIONS: carrying, lifting, standing, stairs, transfers, and locomotion level  PARTICIPATION  LIMITATIONS: shopping, community activity, and yard work  PERSONAL FACTORS: Time since onset of injury/illness/exacerbation, Transportation, and 3+ comorbidities: epilepsy, seizure disorder, rheumatoid arthritis, depression, asthma, and hard of hearing  are also affecting patient's functional outcome.   REHAB POTENTIAL: Good  CLINICAL DECISION MAKING: Evolving/moderate complexity  EVALUATION COMPLEXITY: Moderate  PLAN:  PT FREQUENCY: 2x/week  PT DURATION: 6 weeks  PLANNED INTERVENTIONS: Therapeutic exercises, Therapeutic activity, Neuromuscular re-education, Balance training, Gait training, Patient/Family education, Self Care, Stair training, and Re-evaluation  PLAN FOR NEXT SESSION: Nustep, lower extremity strengthening, balance interventions, and gait training   Miroslav Gin,CHRIS, PTA 04/22/2023, 12:01 PM

## 2023-04-25 ENCOUNTER — Ambulatory Visit: Payer: Medicare PPO

## 2023-04-25 DIAGNOSIS — R2681 Unsteadiness on feet: Secondary | ICD-10-CM | POA: Diagnosis not present

## 2023-04-25 DIAGNOSIS — M6281 Muscle weakness (generalized): Secondary | ICD-10-CM

## 2023-04-25 NOTE — Therapy (Signed)
OUTPATIENT PHYSICAL THERAPY NEURO TREATMENT   Patient Name: Troy Rodgers. MRN: [id number] DOB:02/05/1946, 77 y.o., male 82 Date: 04/25/2023   PCP: Dettinger, Elige Radon, MD REFERRING PROVIDER: Rema Jasmine, MD   END OF SESSION:  PT End of Session - 04/25/23 1119     Visit Number 10    Number of Visits 12    Date for PT Re-Evaluation 06/13/23    PT Start Time 1100    PT Stop Time 1140    PT Time Calculation (min) 40 min                 Past Medical History:  Diagnosis Date   Abdominal pain    Arthritis    rheumatoid    Asthma    Bradycardia    Cataract    Chest pain    Cholelithiases    Depressive disorder, not elsewhere classified    GERD (gastroesophageal reflux disease)    Hearing deficit, bilateral    HLD (hyperlipidemia)    Hyperplasia of prostate    Impotence of organic origin    Kidney stone    Localization-related (focal) (partial) epilepsy and epileptic syndromes with complex partial seizures, without mention of intractable epilepsy    Nasal septal perforation    Other and unspecified hyperlipidemia    Seizures (HCC)    Thyroid disease    Past Surgical History:  Procedure Laterality Date   CHOLECYSTECTOMY  04/28/2013   Procedure: LAPAROSCOPIC CHOLECYSTECTOMY;  Surgeon: Axel Filler, MD;  Location: [facility] OR;  Service: General;;   LEFT SHOULDER SURGERY  1996   Patient Active Problem List   Diagnosis Date Noted   Cerebellar gait 02/26/2021   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 (HCC) 02/26/2021   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 02/26/2021   Polyarthropathy 02/26/2021   GERD (gastroesophageal reflux disease) 04/16/2019   Hypothyroidism 05/04/2014   Solitary pulmonary nodule RLL lateral basal segment  10/12/2013   Rheumatoid arthritis (HCC) 08/19/2013   High risk medication use 08/19/2013   BPH (benign prostatic hyperplasia) 08/19/2013   Seizure disorder (HCC) 05/06/2013    Hyperlipidemia    Depression, recurrent (HCC)    Focal epilepsy with impairment of consciousness (HCC)    Kidney stone    Impotence of organic origin    Nasal septal perforation    Hyperplasia of prostate     ONSET DATE: years ago, but it has slowly gotten worse  REFERRING DIAG: Gait & Balance Training-Polyneropathy with gait & balance impairment   THERAPY DIAG:  Unsteadiness on feet  Muscle weakness (generalized)  Rationale for Evaluation and Treatment: Rehabilitation  SUBJECTIVE:  SUBJECTIVE STATEMENT: Patient denies any pain today and reports feeling well.   Pt accompanied by: significant other  PERTINENT HISTORY: epilepsy, seizure disorder, rheumatoid arthritis, depression, asthma, and hard of hearing  PAIN:  Are you having pain? No  PRECAUTIONS: Fall  RED FLAGS: None   WEIGHT BEARING RESTRICTIONS: No  FALLS: Has patient fallen in last 6 months? Yes. Number of falls 1  LIVING ENVIRONMENT: Lives with: lives with their spouse Lives in: House/apartment Stairs: Yes: External: 2-3 steps; can reach both Has following equipment at home: Single point cane  PLOF: Independent with basic ADLs  PATIENT GOALS: improved balance, safety, and mobility (be able to walk with his cane)   OBJECTIVE: all objective measures were assessed at his initial evaluation on 03/25/23 unless otherwise noted  COGNITION: Overall cognitive status: Within functional limits for tasks assessed   SENSATION: Light touch: equal sensation bilaterally with no deficits noted  COORDINATION: No deficits observed  EDEMA:  No edema observed  POSTURE: rounded shoulders, forward head, and flexed trunk   LOWER EXTREMITY ROM: WFL for activities assessed  LOWER EXTREMITY MMT:    MMT Right Eval Left Eval  Hip  flexion 4/5 4/5  Hip extension    Hip abduction    Hip adduction    Hip internal rotation    Hip external rotation    Knee flexion 4+/5 5/5  Knee extension 4/5 4+/5  Ankle dorsiflexion 4-/5 4-/5  Ankle plantarflexion    Ankle inversion    Ankle eversion    (Blank rows = not tested)  TRANSFERS: Assistive device utilized: Single point cane  Sit to stand: Complete Independence Stand to sit: Complete Independence  GAIT: Gait pattern: step through pattern, decreased stride length, trunk flexed, wide BOS, poor foot clearance- Right, and poor foot clearance- Left Assistive device utilized: Single point cane Level of assistance: Modified independence  FUNCTIONAL TESTS:  5 times sit to stand: 17.98 seconds without UE support Timed up and go (TUG): 19.99 seconds with SPC  BALANCE:   Narrow BOS, firm surface, eyes open and closed: 30 seconds each  Tandem, firm surface, right foot leading: 10 seconds Tandem, firm surface, left foot leading: 4 seconds  TODAY'S TREATMENT:                                                                                                                              DATE:                                     04/25/23 EXERCISE LOG  Exercise Repetitions and Resistance Comments  Nustep  L5 x 18 minutes   Standing  marching 2# x 25 reps bil   Standing hip abduction 2# x 25 reps bil   Standing ham curls 2# x 25 reps bil   Rockerboard 5 mins  balance   Balance pad    LAQ  7.5#  2 x 25 reps            Alternating LE   Seated marching  Alternating LE   Standing toe raise     Sit to stand  15 reps From lowered mat table  Side-stepping  Intermittent UE support  Seated Ham Curls    Step up      Blank cell = exercise not performed today                                    04/09/23 EXERCISE LOG  Exercise Repetitions and Resistance Comments  Nustep  L5 x 19 minutes    Marching on foam  2.5 minutes   Standing hip ABD 2.5 minutes  Alternating LE   LAQ 5# x 2.5  minutes  Alternating LE   Seated marching  5# x 2.5 minutes  Alternating LE  Seated heel/toe raise  20 reps each    Supine hip flexor stretch  1.5 minutes     Blank cell = exercise not performed today                                    PATIENT EDUCATION: Education details: POC, balance, transfers, mobility, safety, and goal for therapy Person educated: Patient and Spouse Education method: Explanation Education comprehension: verbalized understanding  HOME EXERCISE PROGRAM:   GOALS: Goals reviewed with patient? Yes  SHORT TERM GOALS: Target date: 04/15/23  Patient will be independent with his initial HEP.  Baseline: Goal status: MET  2.  Patient will improve his timed up and go time to 15 seconds or less for improved functional mobility.  Baseline: 10/3: 13.7 seconds Goal status: MET  LONG TERM GOALS: Target date: 05/06/23  Patient will be independent with his advanced HEP.  Baseline:  Goal status: IN PROGRESS  2.  Patient will improve his timed up and go time to 12 seconds or less for to reduce his fall risk.   Baseline: 13.7 seconds  Goal status: IN PROGRESS  3.  Patient will improve his five time sit to stand time to 12 seconds or less to reduce his fall risk.  Baseline: 10/3: 10.5 seconds Goal status: MET  4.  Patient will be able to navigate at least 3 steps with a reciprocal pattern for improved household mobility.  Baseline:  Goal status: MET  ASSESSMENT:  CLINICAL IMPRESSION:    Pt arrives for today's treatment session denying any pain.  Pt reports increased BLE fatigue with completion of Nustep.  Pt able to tolerate increased reps with all exercises performed today.  Pt reported increased fatigue at completion of today's treatment session, but denies any pain.   OBJECTIVE IMPAIRMENTS: Abnormal gait, decreased activity tolerance, decreased balance, decreased mobility, difficulty walking, decreased strength, and postural dysfunction.   ACTIVITY LIMITATIONS:  carrying, lifting, standing, stairs, transfers, and locomotion level  PARTICIPATION LIMITATIONS: shopping, community activity, and yard work  PERSONAL FACTORS: Time since onset of injury/illness/exacerbation, Transportation, and 3+ comorbidities: epilepsy, seizure disorder, rheumatoid arthritis, depression, asthma, and hard of hearing  are also affecting patient's functional outcome.   REHAB POTENTIAL: Good  CLINICAL DECISION MAKING: Evolving/moderate complexity  EVALUATION COMPLEXITY: Moderate  PLAN:  PT FREQUENCY: 2x/week  PT DURATION: 6 weeks  PLANNED INTERVENTIONS: Therapeutic exercises, Therapeutic activity, Neuromuscular re-education, Balance training, Gait training, Patient/Family education, Self Care, Stair  training, and Re-evaluation  PLAN FOR NEXT SESSION: Nustep, lower extremity strengthening, balance interventions, and gait training   Newman Pies, PTA 04/25/2023, 11:44 AM

## 2023-04-29 DIAGNOSIS — M0589 Other rheumatoid arthritis with rheumatoid factor of multiple sites: Secondary | ICD-10-CM | POA: Diagnosis not present

## 2023-04-29 DIAGNOSIS — Z6825 Body mass index (BMI) 25.0-25.9, adult: Secondary | ICD-10-CM | POA: Diagnosis not present

## 2023-04-29 DIAGNOSIS — M1991 Primary osteoarthritis, unspecified site: Secondary | ICD-10-CM | POA: Diagnosis not present

## 2023-04-29 DIAGNOSIS — E663 Overweight: Secondary | ICD-10-CM | POA: Diagnosis not present

## 2023-04-29 DIAGNOSIS — Z79899 Other long term (current) drug therapy: Secondary | ICD-10-CM | POA: Diagnosis not present

## 2023-04-30 ENCOUNTER — Ambulatory Visit: Payer: Medicare PPO

## 2023-04-30 DIAGNOSIS — M6281 Muscle weakness (generalized): Secondary | ICD-10-CM | POA: Diagnosis not present

## 2023-04-30 DIAGNOSIS — R2681 Unsteadiness on feet: Secondary | ICD-10-CM | POA: Diagnosis not present

## 2023-04-30 NOTE — Therapy (Signed)
OUTPATIENT PHYSICAL THERAPY NEURO TREATMENT   Patient Name: Troy Rodgers. MRN: [id number] DOB:11-17-1945, 77 y.o., male 40 Date: 04/30/2023   PCP: Dettinger, Elige Radon, MD REFERRING PROVIDER: Rema Jasmine, MD   END OF SESSION:  PT End of Session - 04/30/23 1346     Visit Number 11    Number of Visits 12    Date for PT Re-Evaluation 06/13/23    PT Start Time 1345    PT Stop Time 1425    PT Time Calculation (min) 40 min                 Past Medical History:  Diagnosis Date   Abdominal pain    Arthritis    rheumatoid    Asthma    Bradycardia    Cataract    Chest pain    Cholelithiases    Depressive disorder, not elsewhere classified    GERD (gastroesophageal reflux disease)    Hearing deficit, bilateral    HLD (hyperlipidemia)    Hyperplasia of prostate    Impotence of organic origin    Kidney stone    Localization-related (focal) (partial) epilepsy and epileptic syndromes with complex partial seizures, without mention of intractable epilepsy    Nasal septal perforation    Other and unspecified hyperlipidemia    Seizures (HCC)    Thyroid disease    Past Surgical History:  Procedure Laterality Date   CHOLECYSTECTOMY  04/28/2013   Procedure: LAPAROSCOPIC CHOLECYSTECTOMY;  Surgeon: Axel Filler, MD;  Location: [facility] OR;  Service: General;;   LEFT SHOULDER SURGERY  1996   Patient Active Problem List   Diagnosis Date Noted   Cerebellar gait 02/26/2021   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 (HCC) 02/26/2021   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 02/26/2021   Polyarthropathy 02/26/2021   GERD (gastroesophageal reflux disease) 04/16/2019   Hypothyroidism 05/04/2014   Solitary pulmonary nodule RLL lateral basal segment  10/12/2013   Rheumatoid arthritis (HCC) 08/19/2013   High risk medication use 08/19/2013   BPH (benign prostatic hyperplasia) 08/19/2013   Seizure disorder (HCC) 05/06/2013    Hyperlipidemia    Depression, recurrent (HCC)    Focal epilepsy with impairment of consciousness (HCC)    Kidney stone    Impotence of organic origin    Nasal septal perforation    Hyperplasia of prostate     ONSET DATE: years ago, but it has slowly gotten worse  REFERRING DIAG: Gait & Balance Training-Polyneropathy with gait & balance impairment   THERAPY DIAG:  Unsteadiness on feet  Muscle weakness (generalized)  Rationale for Evaluation and Treatment: Rehabilitation  SUBJECTIVE:  SUBJECTIVE STATEMENT: Patient denies any pain today and reports feeling well.   Pt accompanied by: significant other  PERTINENT HISTORY: epilepsy, seizure disorder, rheumatoid arthritis, depression, asthma, and hard of hearing  PAIN:  Are you having pain? No  PRECAUTIONS: Fall  RED FLAGS: None   WEIGHT BEARING RESTRICTIONS: No  FALLS: Has patient fallen in last 6 months? Yes. Number of falls 1  LIVING ENVIRONMENT: Lives with: lives with their spouse Lives in: House/apartment Stairs: Yes: External: 2-3 steps; can reach both Has following equipment at home: Single point cane  PLOF: Independent with basic ADLs  PATIENT GOALS: improved balance, safety, and mobility (be able to walk with his cane)   OBJECTIVE: all objective measures were assessed at his initial evaluation on 03/25/23 unless otherwise noted  COGNITION: Overall cognitive status: Within functional limits for tasks assessed   SENSATION: Light touch: equal sensation bilaterally with no deficits noted  COORDINATION: No deficits observed  EDEMA:  No edema observed  POSTURE: rounded shoulders, forward head, and flexed trunk   LOWER EXTREMITY ROM: WFL for activities assessed  LOWER EXTREMITY MMT:    MMT Right Eval Left Eval  Hip  flexion 4/5 4/5  Hip extension    Hip abduction    Hip adduction    Hip internal rotation    Hip external rotation    Knee flexion 4+/5 5/5  Knee extension 4/5 4+/5  Ankle dorsiflexion 4-/5 4-/5  Ankle plantarflexion    Ankle inversion    Ankle eversion    (Blank rows = not tested)  TRANSFERS: Assistive device utilized: Single point cane  Sit to stand: Complete Independence Stand to sit: Complete Independence  GAIT: Gait pattern: step through pattern, decreased stride length, trunk flexed, wide BOS, poor foot clearance- Right, and poor foot clearance- Left Assistive device utilized: Single point cane Level of assistance: Modified independence  FUNCTIONAL TESTS:  5 times sit to stand: 17.98 seconds without UE support Timed up and go (TUG): 19.99 seconds with SPC  BALANCE:   Narrow BOS, firm surface, eyes open and closed: 30 seconds each  Tandem, firm surface, right foot leading: 10 seconds Tandem, firm surface, left foot leading: 4 seconds  TODAY'S TREATMENT:                                                                                                                              DATE:                                     04/30/23 EXERCISE LOG  Exercise Repetitions and Resistance Comments  Nustep  L5 x 20 minutes   Standing  marching 3# x 20 reps bil   Standing hip abduction 3# x 20 reps bil   Standing ham curls 3# x 20 reps bil   Rockerboard 5 mins  balance   Balance pad    LAQ  7.5#  2 x 25 reps            Alternating LE   Side-stepping Airex x 3 mins Alternating LE   Tandem Gait Airex x 3 mins   Sit to stand  15 reps From lowered mat table   Blank cell = exercise not performed today                                    04/09/23 EXERCISE LOG  Exercise Repetitions and Resistance Comments  Nustep  L5 x 19 minutes    Marching on foam  2.5 minutes   Standing hip ABD 2.5 minutes  Alternating LE   LAQ 5# x 2.5 minutes  Alternating LE   Seated marching  5# x 2.5  minutes  Alternating LE  Seated heel/toe raise  20 reps each    Supine hip flexor stretch  1.5 minutes     Blank cell = exercise not performed today                                    PATIENT EDUCATION: Education details: POC, balance, transfers, mobility, safety, and goal for therapy Person educated: Patient and Spouse Education method: Explanation Education comprehension: verbalized understanding  HOME EXERCISE PROGRAM:   GOALS: Goals reviewed with patient? Yes  SHORT TERM GOALS: Target date: 04/15/23  Patient will be independent with his initial HEP.  Baseline: Goal status: MET  2.  Patient will improve his timed up and go time to 15 seconds or less for improved functional mobility.  Baseline: 10/3: 13.7 seconds Goal status: MET  LONG TERM GOALS: Target date: 05/06/23  Patient will be independent with his advanced HEP.  Baseline:  Goal status: IN PROGRESS  2.  Patient will improve his timed up and go time to 12 seconds or less for to reduce his fall risk.   Baseline: 13.7 seconds  Goal status: IN PROGRESS  3.  Patient will improve his five time sit to stand time to 12 seconds or less to reduce his fall risk.  Baseline: 10/3: 10.5 seconds Goal status: MET  4.  Patient will be able to navigate at least 3 steps with a reciprocal pattern for improved household mobility.  Baseline:  Goal status: MET  ASSESSMENT:  CLINICAL IMPRESSION:    Pt arrives for today's treatment session denying any pain.  Pt able to tolerate increased weight with standing exercises to without discomfort, but does report fatigue.  Pt introduced to side-stepping and tandem gait on the airex balance  beam with BUE support required for safety and stability.  Pt will be ready for discharge at next treatment session.  Pt denied any pain at completion of today's treatment session.   OBJECTIVE IMPAIRMENTS: Abnormal gait, decreased activity tolerance, decreased balance, decreased mobility, difficulty  walking, decreased strength, and postural dysfunction.   ACTIVITY LIMITATIONS: carrying, lifting, standing, stairs, transfers, and locomotion level  PARTICIPATION LIMITATIONS: shopping, community activity, and yard work  PERSONAL FACTORS: Time since onset of injury/illness/exacerbation, Transportation, and 3+ comorbidities: epilepsy, seizure disorder, rheumatoid arthritis, depression, asthma, and hard of hearing  are also affecting patient's functional outcome.   REHAB POTENTIAL: Good  CLINICAL DECISION MAKING: Evolving/moderate complexity  EVALUATION COMPLEXITY: Moderate  PLAN:  PT FREQUENCY: 2x/week  PT DURATION: 6 weeks  PLANNED INTERVENTIONS: Therapeutic exercises, Therapeutic activity, Neuromuscular re-education,  Balance training, Gait training, Patient/Family education, Self Care, Stair training, and Re-evaluation  PLAN FOR NEXT SESSION: Nustep, lower extremity strengthening, balance interventions, and gait training   Newman Pies, PTA 04/30/2023, 3:01 PM

## 2023-05-02 ENCOUNTER — Ambulatory Visit: Payer: Medicare PPO

## 2023-05-02 DIAGNOSIS — M6281 Muscle weakness (generalized): Secondary | ICD-10-CM | POA: Diagnosis not present

## 2023-05-02 DIAGNOSIS — R2681 Unsteadiness on feet: Secondary | ICD-10-CM

## 2023-05-02 NOTE — Therapy (Addendum)
OUTPATIENT PHYSICAL THERAPY NEURO TREATMENT   Patient Name: Troy Rodgers. MRN: [id number] DOB:[birth year], 77 y.o., male 32 Date: 05/02/2023   PCP: Dettinger, Elige Radon, MD REFERRING PROVIDER: Rema Jasmine, MD   END OF SESSION:  PT End of Session - 05/02/23 1102     Visit Number 12    Number of Visits 12    Date for PT Re-Evaluation 06/13/23    PT Start Time 1100    PT Stop Time 1141    PT Time Calculation (min) 41 min                 Past Medical History:  Diagnosis Date   Abdominal pain    Arthritis    rheumatoid    Asthma    Bradycardia    Cataract    Chest pain    Cholelithiases    Depressive disorder, not elsewhere classified    GERD (gastroesophageal reflux disease)    Hearing deficit, bilateral    HLD (hyperlipidemia)    Hyperplasia of prostate    Impotence of organic origin    Kidney stone    Localization-related (focal) (partial) epilepsy and epileptic syndromes with complex partial seizures, without mention of intractable epilepsy    Nasal septal perforation    Other and unspecified hyperlipidemia    Seizures (HCC)    Thyroid disease    Past Surgical History:  Procedure Laterality Date   CHOLECYSTECTOMY  04/28/2013   Procedure: LAPAROSCOPIC CHOLECYSTECTOMY;  Surgeon: Axel Filler, MD;  Location: [facility] OR;  Service: General;;   LEFT SHOULDER SURGERY  1996   Patient Active Problem List   Diagnosis Date Noted   Cerebellar gait 02/26/2021   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 (HCC) 02/26/2021   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 02/26/2021   Polyarthropathy 02/26/2021   GERD (gastroesophageal reflux disease) 04/16/2019   Hypothyroidism 05/04/2014   Solitary pulmonary nodule RLL lateral basal segment  10/12/2013   Rheumatoid arthritis (HCC) 08/19/2013   High risk medication use 08/19/2013   BPH (benign prostatic hyperplasia) 08/19/2013   Seizure disorder (HCC) 05/06/2013    Hyperlipidemia    Depression, recurrent (HCC)    Focal epilepsy with impairment of consciousness (HCC)    Kidney stone    Impotence of organic origin    Nasal septal perforation    Hyperplasia of prostate     ONSET DATE: years ago, but it has slowly gotten worse  REFERRING DIAG: Gait & Balance Training-Polyneropathy with gait & balance impairment   THERAPY DIAG:  Unsteadiness on feet  Muscle weakness (generalized)  Rationale for Evaluation and Treatment: Rehabilitation  SUBJECTIVE:  SUBJECTIVE STATEMENT: Patient denies any pain today and reports feeling well.  Ready for discharge today.   Pt accompanied by: significant other  PERTINENT HISTORY: epilepsy, seizure disorder, rheumatoid arthritis, depression, asthma, and hard of hearing  PAIN:  Are you having pain? No  PRECAUTIONS: Fall  RED FLAGS: None   WEIGHT BEARING RESTRICTIONS: No  FALLS: Has patient fallen in last 6 months? Yes. Number of falls 1  LIVING ENVIRONMENT: Lives with: lives with their spouse Lives in: House/apartment Stairs: Yes: External: 2-3 steps; can reach both Has following equipment at home: Single point cane  PLOF: Independent with basic ADLs  PATIENT GOALS: improved balance, safety, and mobility (be able to walk with his cane)   OBJECTIVE: all objective measures were assessed at his initial evaluation on 03/25/23 unless otherwise noted  COGNITION: Overall cognitive status: Within functional limits for tasks assessed   SENSATION: Light touch: equal sensation bilaterally with no deficits noted  COORDINATION: No deficits observed  EDEMA:  No edema observed  POSTURE: rounded shoulders, forward head, and flexed trunk   LOWER EXTREMITY ROM: WFL for activities assessed  LOWER EXTREMITY MMT:    MMT  Right Eval Left Eval  Hip flexion 4/5 4/5  Hip extension    Hip abduction    Hip adduction    Hip internal rotation    Hip external rotation    Knee flexion 4+/5 5/5  Knee extension 4/5 4+/5  Ankle dorsiflexion 4-/5 4-/5  Ankle plantarflexion    Ankle inversion    Ankle eversion    (Blank rows = not tested)  TRANSFERS: Assistive device utilized: Single point cane  Sit to stand: Complete Independence Stand to sit: Complete Independence  GAIT: Gait pattern: step through pattern, decreased stride length, trunk flexed, wide BOS, poor foot clearance- Right, and poor foot clearance- Left Assistive device utilized: Single point cane Level of assistance: Modified independence  FUNCTIONAL TESTS:  5 times sit to stand: 17.98 seconds without UE support Timed up and go (TUG): 19.99 seconds with SPC  BALANCE:   Narrow BOS, firm surface, eyes open and closed: 30 seconds each  Tandem, firm surface, right foot leading: 10 seconds Tandem, firm surface, left foot leading: 4 seconds  TODAY'S TREATMENT:                                                                                                                              DATE:                                     05/02/23 EXERCISE LOG  Exercise Repetitions and Resistance Comments  Nustep  L5 x 20 minutes   Standing  marching 3# x 20 reps bil   Standing hip abduction 3# x 20 reps bil   Standing ham curls 3# x 20 reps bil   Rockerboard 5 mins  balance   Balance  pad    LAQ 5# x 3.5 mins           Alternating LE   Side-stepping  Alternating LE   Tandem Gait    Sit to stand  20 reps From lowered mat table   Blank cell = exercise not performed today                                    04/09/23 EXERCISE LOG  Exercise Repetitions and Resistance Comments  Nustep  L5 x 19 minutes    Marching on foam  2.5 minutes   Standing hip ABD 2.5 minutes  Alternating LE   LAQ 5# x 2.5 minutes  Alternating LE   Seated marching  5# x 2.5 minutes   Alternating LE  Seated heel/toe raise  20 reps each    Supine hip flexor stretch  1.5 minutes     Blank cell = exercise not performed today                                    PATIENT EDUCATION: Education details: POC, balance, transfers, mobility, safety, and goal for therapy Person educated: Patient and Spouse Education method: Explanation Education comprehension: verbalized understanding  HOME EXERCISE PROGRAM:   GOALS: Goals reviewed with patient? Yes  SHORT TERM GOALS: Target date: 04/15/23  Patient will be independent with his initial HEP.  Baseline: Goal status: MET  2.  Patient will improve his timed up and go time to 15 seconds or less for improved functional mobility.  Baseline: 10/3: 13.7 seconds Goal status: MET  LONG TERM GOALS: Target date: 05/06/23  Patient will be independent with his advanced HEP.  Baseline:  Goal status: MET  2.  Patient will improve his timed up and go time to 12 seconds or less for to reduce his fall risk.   Baseline: 13.7 seconds; 10/18: 12.88 seconds Goal status: NEARLY MET  3.  Patient will improve his five time sit to stand time to 12 seconds or less to reduce his fall risk.  Baseline: 10/3: 10.5 seconds Goal status: MET  4.  Patient will be able to navigate at least 3 steps with a reciprocal pattern for improved household mobility.  Baseline:  Goal status: MET  ASSESSMENT:  CLINICAL IMPRESSION:    Pt arrives for today's treatment session denying any pain. Pt able to perform TUG in 12.88 seconds without use of AD, partially meeting his goal.  Pt has met all of his other goals at this time.  Pt able to tolerate increased time or reps with all exercises performed today.  Pt encouraged to call the facility with any questions or concerns.  Pt ready for discharge at this time.  Pt denied any pain at completion of today's treatment session.    PHYSICAL THERAPY DISCHARGE SUMMARY  Visits from Start of Care: 12  Current functional  level related to goals / functional outcomes: Patient was able to meet or nearly meet all of his goals for therapy and felt comfortable being discharged at this time.    Remaining deficits: None    Education / Equipment: HEP   Patient agrees to discharge. Patient goals were met. Patient is being discharged due to being pleased with the current functional level.  Candi Leash, PT, DPT    OBJECTIVE IMPAIRMENTS: Abnormal gait, decreased activity tolerance,  decreased balance, decreased mobility, difficulty walking, decreased strength, and postural dysfunction.   ACTIVITY LIMITATIONS: carrying, lifting, standing, stairs, transfers, and locomotion level  PARTICIPATION LIMITATIONS: shopping, community activity, and yard work  PERSONAL FACTORS: Time since onset of injury/illness/exacerbation, Transportation, and 3+ comorbidities: epilepsy, seizure disorder, rheumatoid arthritis, depression, asthma, and hard of hearing  are also affecting patient's functional outcome.   REHAB POTENTIAL: Good  CLINICAL DECISION MAKING: Evolving/moderate complexity  EVALUATION COMPLEXITY: Moderate  PLAN:  PT FREQUENCY: 2x/week  PT DURATION: 6 weeks  PLANNED INTERVENTIONS: Therapeutic exercises, Therapeutic activity, Neuromuscular re-education, Balance training, Gait training, Patient/Family education, Self Care, Stair training, and Re-evaluation  PLAN FOR NEXT SESSION: Nustep, lower extremity strengthening, balance interventions, and gait training   Newman Pies, PTA 05/02/2023, 11:43 AM

## 2023-05-21 DIAGNOSIS — R2689 Other abnormalities of gait and mobility: Secondary | ICD-10-CM | POA: Diagnosis not present

## 2023-05-21 DIAGNOSIS — R292 Abnormal reflex: Secondary | ICD-10-CM | POA: Diagnosis not present

## 2023-05-21 DIAGNOSIS — G40219 Localization-related (focal) (partial) symptomatic epilepsy and epileptic syndromes with complex partial seizures, intractable, without status epilepticus: Secondary | ICD-10-CM | POA: Diagnosis not present

## 2023-05-21 DIAGNOSIS — G629 Polyneuropathy, unspecified: Secondary | ICD-10-CM | POA: Diagnosis not present

## 2023-08-12 DIAGNOSIS — M0589 Other rheumatoid arthritis with rheumatoid factor of multiple sites: Secondary | ICD-10-CM | POA: Diagnosis not present

## 2023-08-22 ENCOUNTER — Encounter: Payer: Self-pay | Admitting: Family Medicine

## 2023-08-22 ENCOUNTER — Ambulatory Visit: Payer: Medicare PPO | Admitting: Family Medicine

## 2023-08-22 VITALS — BP 122/60 | Temp 98.0°F | Ht 66.0 in | Wt 149.0 lb

## 2023-08-22 DIAGNOSIS — E782 Mixed hyperlipidemia: Secondary | ICD-10-CM | POA: Diagnosis not present

## 2023-08-22 DIAGNOSIS — K219 Gastro-esophageal reflux disease without esophagitis: Secondary | ICD-10-CM

## 2023-08-22 DIAGNOSIS — E039 Hypothyroidism, unspecified: Secondary | ICD-10-CM | POA: Diagnosis not present

## 2023-08-22 NOTE — Progress Notes (Signed)
 BP 122/60   Temp 98 F (36.7 C)   Ht 5' 6 (1.676 m)   Wt 149 lb (67.6 kg)   SpO2 98%   BMI 24.05 kg/m    Subjective:   Patient ID: Troy SHAUNNA Gladis Mickey., male    DOB: Apr 28, 1946, 78 y.o.   MRN: [id number]  HPI: Troy Sermon. is a 78 y.o. male presenting on 08/22/2023 for Medical Management of Chronic Issues, Hyperlipidemia, and Hypothyroidism   HPI Hypothyroidism recheck Patient is coming in for thyroid  recheck today as well. They deny any issues with hair changes or heat or cold problems or diarrhea or constipation. They deny any chest pain or palpitations. They are currently on levothyroxine  50 micrograms   Hyperlipidemia Patient is coming in for recheck of his hyperlipidemia. The patient is currently taking Lipitor and Zetia . They deny any issues with myalgias or history of liver damage from it. They deny any focal numbness or weakness or chest pain.   GERD Patient is currently on omeprazole .  She denies any major symptoms or abdominal pain or belching or burping. She denies any blood in her stool or lightheadedness or dizziness.   Relevant past medical, surgical, family and social history reviewed and updated as indicated. Interim medical history since our last visit reviewed. Allergies and medications reviewed and updated.  Review of Systems  Constitutional:  Negative for chills and fever.  Eyes:  Negative for visual disturbance.  Respiratory:  Negative for shortness of breath and wheezing.   Cardiovascular:  Negative for chest pain and leg swelling.  Musculoskeletal:  Negative for back pain and gait problem.  Skin:  Negative for rash.  Neurological:  Negative for dizziness and light-headedness.  All other systems reviewed and are negative.   Per HPI unless specifically indicated above   Allergies as of 08/22/2023       Reactions   Fentanyl  Other (See Comments)   In high doses of 100 mcg pt's O2 sats decrease to 60% RA   Hydrocodone    Intolerance          Medication List        Accurate as of August 22, 2023 10:25 AM. If you have any questions, ask your nurse or doctor.          aspirin EC 81 MG tablet Take 81 mg by mouth daily.   atorvastatin  40 MG tablet Commonly known as: LIPITOR Take 1 tablet (40 mg total) by mouth daily.   escitalopram  20 MG tablet Commonly known as: LEXAPRO  Take 1 tablet (20 mg total) by mouth daily.   ezetimibe  10 MG tablet Commonly known as: ZETIA  Take 1 tablet (10 mg total) by mouth daily.   folic acid 1 MG tablet Commonly known as: FOLVITE Take 1 mg by mouth 2 (two) times daily.   lacosamide  200 MG Tabs tablet Commonly known as: VIMPAT  Take 200 mg by mouth 2 (two) times daily.   levothyroxine  50 MCG tablet Commonly known as: SYNTHROID  Take 1 tablet (50 mcg total) by mouth daily.   methotrexate 2.5 MG tablet Commonly known as: RHEUMATREX Take 10 mg by mouth once a week.   omega-3 fish oil 1000 MG Caps capsule Commonly known as: MAXEPA Take 2 capsules by mouth daily.   omeprazole  20 MG capsule Commonly known as: PRILOSEC Take 1 capsule (20 mg total) by mouth daily.   primidone  250 MG tablet Commonly known as: MYSOLINE  250 mg. 1 in the am and 1 at bedtime  Vitamin D3 10 MCG (400 UNIT) Caps Take 800 Units by mouth.         Objective:   BP 122/60   Temp 98 F (36.7 C)   Ht 5' 6 (1.676 m)   Wt 149 lb (67.6 kg)   SpO2 98%   BMI 24.05 kg/m   Wt Readings from Last 3 Encounters:  08/22/23 149 lb (67.6 kg)  03/03/23 150 lb (68 kg)  02/19/23 149 lb (67.6 kg)    Physical Exam Vitals and nursing note reviewed.  Constitutional:      General: He is not in acute distress.    Appearance: He is well-developed. He is not diaphoretic.  Eyes:     General: No scleral icterus.    Conjunctiva/sclera: Conjunctivae normal.  Neck:     Thyroid : No thyromegaly.  Cardiovascular:     Rate and Rhythm: Normal rate and regular rhythm.     Heart sounds: Normal heart sounds. No murmur  heard. Pulmonary:     Effort: Pulmonary effort is normal. No respiratory distress.     Breath sounds: Normal breath sounds. No wheezing.  Musculoskeletal:        General: No swelling. Normal range of motion.     Cervical back: Neck supple.  Lymphadenopathy:     Cervical: No cervical adenopathy.  Skin:    General: Skin is warm and dry.     Findings: No rash.  Neurological:     Mental Status: He is alert and oriented to person, place, and time.     Coordination: Coordination normal.  Psychiatric:        Behavior: Behavior normal.       Assessment & Plan:   Problem List Items Addressed This Visit       Digestive   GERD (gastroesophageal reflux disease)   Relevant Orders   CBC with Differential/Platelet     Endocrine   Hypothyroidism   Relevant Orders   CMP14+EGFR   TSH     Other   Hyperlipidemia - Primary   Relevant Orders   CBC with Differential/Platelet   CMP14+EGFR   Lipid panel    Patient's hearing and word recognition when it was recently tested was slightly worse.  They are working with the audiologist for this.  He is hesitant towards colonoscopy because he does not want to be sedated again but is going to think about it. Follow up plan: Return in about 6 months (around 02/19/2024), or if symptoms worsen or fail to improve.  Counseling provided for all of the vaccine components No orders of the defined types were placed in this encounter.   Fonda Levins, MD [facility] 08/22/2023, 10:25 AM

## 2023-08-23 LAB — CBC WITH DIFFERENTIAL/PLATELET
Basophils Absolute: 0.1 10*3/uL (ref 0.0–0.2)
Basos: 1 %
EOS (ABSOLUTE): 0.1 10*3/uL (ref 0.0–0.4)
Eos: 1 %
Hematocrit: 36 % — ABNORMAL LOW (ref 37.5–51.0)
Hemoglobin: 12.8 g/dL — ABNORMAL LOW (ref 13.0–17.7)
Immature Grans (Abs): 0 10*3/uL (ref 0.0–0.1)
Immature Granulocytes: 0 %
Lymphocytes Absolute: 1.7 10*3/uL (ref 0.7–3.1)
Lymphs: 24 %
MCH: 36.1 pg — ABNORMAL HIGH (ref 26.6–33.0)
MCHC: 35.6 g/dL (ref 31.5–35.7)
MCV: 101 fL — ABNORMAL HIGH (ref 79–97)
Monocytes Absolute: 0.7 10*3/uL (ref 0.1–0.9)
Monocytes: 9 %
Neutrophils Absolute: 4.5 10*3/uL (ref 1.4–7.0)
Neutrophils: 65 %
Platelets: 279 10*3/uL (ref 150–450)
RBC: 3.55 x10E6/uL — ABNORMAL LOW (ref 4.14–5.80)
RDW: 12.6 % (ref 11.6–15.4)
WBC: 7 10*3/uL (ref 3.4–10.8)

## 2023-08-23 LAB — TSH: TSH: 3.78 u[IU]/mL (ref 0.450–4.500)

## 2023-08-23 LAB — CMP14+EGFR
ALT: 31 [IU]/L (ref 0–44)
AST: 39 [IU]/L (ref 0–40)
Albumin: 4.2 g/dL (ref 3.8–4.8)
Alkaline Phosphatase: 103 [IU]/L (ref 44–121)
BUN/Creatinine Ratio: 11 (ref 10–24)
BUN: 12 mg/dL (ref 8–27)
Bilirubin Total: 0.2 mg/dL (ref 0.0–1.2)
CO2: 26 mmol/L (ref 20–29)
Calcium: 8.8 mg/dL (ref 8.6–10.2)
Chloride: 97 mmol/L (ref 96–106)
Creatinine, Ser: 1.09 mg/dL (ref 0.76–1.27)
Globulin, Total: 2.8 g/dL (ref 1.5–4.5)
Glucose: 90 mg/dL (ref 70–99)
Potassium: 5 mmol/L (ref 3.5–5.2)
Sodium: 140 mmol/L (ref 134–144)
Total Protein: 7 g/dL (ref 6.0–8.5)
eGFR: 69 mL/min/{1.73_m2} (ref >59)

## 2023-08-23 LAB — LIPID PANEL
Chol/HDL Ratio: 2.5 {ratio} (ref 0.0–5.0)
Cholesterol, Total: 134 mg/dL (ref 100–199)
HDL: 54 mg/dL (ref >39)
LDL Chol Calc (NIH): 60 mg/dL (ref 0–99)
Triglycerides: 108 mg/dL (ref 0–149)
VLDL Cholesterol Cal: 20 mg/dL (ref 5–40)

## 2023-08-28 ENCOUNTER — Encounter: Payer: Self-pay | Admitting: Family Medicine

## 2023-10-28 DIAGNOSIS — M0589 Other rheumatoid arthritis with rheumatoid factor of multiple sites: Secondary | ICD-10-CM | POA: Diagnosis not present

## 2023-10-28 DIAGNOSIS — Z6824 Body mass index (BMI) 24.0-24.9, adult: Secondary | ICD-10-CM | POA: Diagnosis not present

## 2023-10-28 DIAGNOSIS — Z79899 Other long term (current) drug therapy: Secondary | ICD-10-CM | POA: Diagnosis not present

## 2023-10-28 DIAGNOSIS — M1991 Primary osteoarthritis, unspecified site: Secondary | ICD-10-CM | POA: Diagnosis not present

## 2023-11-04 DIAGNOSIS — G40219 Localization-related (focal) (partial) symptomatic epilepsy and epileptic syndromes with complex partial seizures, intractable, without status epilepticus: Secondary | ICD-10-CM | POA: Diagnosis not present

## 2023-11-04 DIAGNOSIS — G629 Polyneuropathy, unspecified: Secondary | ICD-10-CM | POA: Diagnosis not present

## 2023-11-04 DIAGNOSIS — R292 Abnormal reflex: Secondary | ICD-10-CM | POA: Diagnosis not present

## 2023-11-04 DIAGNOSIS — R2689 Other abnormalities of gait and mobility: Secondary | ICD-10-CM | POA: Diagnosis not present

## 2023-12-06 ENCOUNTER — Other Ambulatory Visit: Payer: Self-pay | Admitting: Family Medicine

## 2023-12-06 DIAGNOSIS — K219 Gastro-esophageal reflux disease without esophagitis: Secondary | ICD-10-CM

## 2024-01-26 DIAGNOSIS — M0589 Other rheumatoid arthritis with rheumatoid factor of multiple sites: Secondary | ICD-10-CM | POA: Diagnosis not present

## 2024-01-29 DIAGNOSIS — H5203 Hypermetropia, bilateral: Secondary | ICD-10-CM | POA: Diagnosis not present

## 2024-01-29 DIAGNOSIS — H2513 Age-related nuclear cataract, bilateral: Secondary | ICD-10-CM | POA: Diagnosis not present

## 2024-02-19 ENCOUNTER — Encounter: Payer: Self-pay | Admitting: Family Medicine

## 2024-02-19 ENCOUNTER — Ambulatory Visit: Payer: Medicare PPO | Admitting: Family Medicine

## 2024-02-19 VITALS — BP 135/76 | HR 61 | Ht 66.0 in | Wt 144.0 lb

## 2024-02-19 DIAGNOSIS — F339 Major depressive disorder, recurrent, unspecified: Secondary | ICD-10-CM | POA: Diagnosis not present

## 2024-02-19 DIAGNOSIS — E782 Mixed hyperlipidemia: Secondary | ICD-10-CM | POA: Diagnosis not present

## 2024-02-19 DIAGNOSIS — N400 Enlarged prostate without lower urinary tract symptoms: Secondary | ICD-10-CM | POA: Diagnosis not present

## 2024-02-19 DIAGNOSIS — E039 Hypothyroidism, unspecified: Secondary | ICD-10-CM

## 2024-02-19 DIAGNOSIS — R296 Repeated falls: Secondary | ICD-10-CM | POA: Diagnosis not present

## 2024-02-19 LAB — LIPID PANEL

## 2024-02-19 MED ORDER — ESCITALOPRAM OXALATE 20 MG PO TABS: 20.0000 mg | ORAL_TABLET | Freq: Every day | ORAL | 3 refills | Status: AC

## 2024-02-19 MED ORDER — ATORVASTATIN CALCIUM 40 MG PO TABS: 40.0000 mg | ORAL_TABLET | Freq: Every day | ORAL | 3 refills | Status: AC

## 2024-02-19 MED ORDER — LEVOTHYROXINE SODIUM 50 MCG PO TABS: 50.0000 ug | ORAL_TABLET | Freq: Every day | ORAL | 3 refills | Status: AC

## 2024-02-19 MED ORDER — EZETIMIBE 10 MG PO TABS: 10.0000 mg | ORAL_TABLET | Freq: Every day | ORAL | 3 refills | Status: AC

## 2024-02-19 NOTE — Progress Notes (Signed)
 BP 135/76   Pulse 61   Ht 5' 6 (1.676 m)   Wt 144 lb (65.3 kg)   SpO2 98%   BMI 23.24 kg/m    Subjective:   Patient ID: Troy SHAUNNA Gladis Mickey., male    DOB: 02-May-1946, 78 y.o.   MRN: [id number]  HPI: Troy Coldwell. is a 78 y.o. male presenting on 02/19/2024 for Medical Management of Chronic Issues, Hyperlipidemia, Hypothyroidism, Foot Swelling (left), and Fall (Stumbles more frequently)   Discussed the use of AI scribe software for clinical note transcription with the patient, who gave verbal consent to proceed.  History of Present Illness   Troy Galdamez Gustin Zobrist. is a 78 year old male who presents for a routine check-up.  He has been experiencing improvement in his leg condition recently. He continues to take thyroid  medication, 50 micrograms daily. He continues to take ezetimibe  and atorvastatin  for cholesterol. He discontinued fish oil supplements three months ago due to nausea and loss of appetite, which have since resolved.  He continues to take Lexapro  for anxiety and depression, which he feels is effective. He has been on this medication for a significant period and reports stable mood and anxiety levels.  He has neuropathy in his feet, affecting his balance and leading to falls. He primarily uses a cane and occasionally a walker, especially after a recent fall when getting out of bed. He has undergone physical therapy to strengthen his legs and ankles and continues to perform a 30-minute exercise routine at home, involving both sitting and standing exercises.  He experiences intermittent swelling on the top of his left foot, which typically resolves overnight.          Relevant past medical, surgical, family and social history reviewed and updated as indicated. Interim medical history since our last visit reviewed. Allergies and medications reviewed and updated.  Review of Systems  Constitutional:  Negative for chills and fever.  Eyes:  Negative for visual disturbance.   Respiratory:  Negative for shortness of breath and wheezing.   Cardiovascular:  Positive for leg swelling. Negative for chest pain.  Musculoskeletal:  Positive for gait problem. Negative for back pain.  Skin:  Negative for rash.  Neurological:  Positive for numbness.  All other systems reviewed and are negative.   Per HPI unless specifically indicated above   Allergies as of 02/19/2024       Reactions   Fentanyl  Other (See Comments)   In high doses of 100 mcg pt's O2 sats decrease to 60% RA   Hydrocodone    Intolerance         Medication List        Accurate as of February 19, 2024 10:44 AM. If you have any questions, ask your nurse or doctor.          aspirin EC 81 MG tablet Take 81 mg by mouth daily.   atorvastatin  40 MG tablet Commonly known as: LIPITOR Take 1 tablet (40 mg total) by mouth daily.   escitalopram  20 MG tablet Commonly known as: LEXAPRO  Take 1 tablet (20 mg total) by mouth daily.   ezetimibe  10 MG tablet Commonly known as: ZETIA  Take 1 tablet (10 mg total) by mouth daily.   folic acid 1 MG tablet Commonly known as: FOLVITE Take 1 mg by mouth 2 (two) times daily.   lacosamide  200 MG Tabs tablet Commonly known as: VIMPAT  Take 200 mg by mouth 2 (two) times daily.   levothyroxine  50 MCG  tablet Commonly known as: SYNTHROID  Take 1 tablet (50 mcg total) by mouth daily.   methotrexate 2.5 MG tablet Commonly known as: RHEUMATREX Take 10 mg by mouth once a week.   omega-3 fish oil 1000 MG Caps capsule Commonly known as: MAXEPA Take 2 capsules by mouth daily.   omeprazole  20 MG capsule Commonly known as: PRILOSEC TAKE 1 CAPSULE BY MOUTH EVERY DAY   primidone  250 MG tablet Commonly known as: MYSOLINE  250 mg. 1 in the am and 1 at bedtime   Vitamin D3 10 MCG (400 UNIT) Caps Take 800 Units by mouth.         Objective:   BP 135/76   Pulse 61   Ht 5' 6 (1.676 m)   Wt 144 lb (65.3 kg)   SpO2 98%   BMI 23.24 kg/m   Wt Readings  from Last 3 Encounters:  02/19/24 144 lb (65.3 kg)  08/22/23 149 lb (67.6 kg)  03/03/23 150 lb (68 kg)    Physical Exam Physical Exam   CHEST: Lungs clear to auscultation bilaterally. CARDIOVASCULAR: Heart regular rate and rhythm. ABDOMEN: Abdomen soft, non-tender. No costovertebral angle tenderness. EXTREMITIES: No edema in legs.         Assessment & Plan:   Problem List Items Addressed This Visit       Endocrine   Hypothyroidism - Primary   Relevant Medications   levothyroxine  (SYNTHROID ) 50 MCG tablet   Other Relevant Orders   CBC with Differential/Platelet   CMP14+EGFR   TSH     Genitourinary   BPH (benign prostatic hyperplasia)   Relevant Orders   PSA, total and free     Other   Hyperlipidemia   Relevant Medications   atorvastatin  (LIPITOR) 40 MG tablet   ezetimibe  (ZETIA ) 10 MG tablet   Other Relevant Orders   CBC with Differential/Platelet   CMP14+EGFR   Lipid panel   Depression, recurrent (HCC)   Relevant Medications   escitalopram  (LEXAPRO ) 20 MG tablet   Other Relevant Orders   CBC with Differential/Platelet   Other Visit Diagnoses       Falls              Adult Wellness Visit Wellness visit with ongoing management of chronic conditions. - Perform routine blood work to check thyroid  and cholesterol levels. - Discuss colonoscopy with gastroenterologist, considering concerns about dehydration.  Peripheral Neuropathy of Feet with Recurrent Falls Neuropathy in feet contributing to falls. Uses a cane and occasionally a walker. - Encourage consistent use of assistive devices, especially at night. - Recommend core muscle strengthening exercises. - Consider referral to physical therapy if falls continue.  Edema of Left Foot Intermittent swelling of the left foot, primarily on the top, resolving overnight. - Use light compression stockings as needed. - Elevate legs when sitting. - Increase walking and physical  activity.  Hypothyroidism Well-managed on levothyroxine  50 mcg daily with no reported issues. - Continue levothyroxine  50 mcg daily. - Check thyroid  levels with blood work.  Hyperlipidemia Managed with atorvastatin  and ezetimibe . Fish oil discontinued due to nausea and decreased appetite. Triglycerides not historically problematic. - Continue atorvastatin  and ezetimibe . - Check cholesterol levels with blood work. - Consider reintroducing fish oil if triglycerides are elevated, potentially using frozen fish oil capsules to reduce gastrointestinal side effects.  Depression and Anxiety Well-managed on Lexapro  with good mood and anxiety control. - Continue Lexapro .  Rheumatoid Arthritis Managed by rheumatology with methotrexate. Satisfied with current management. - Continue current methotrexate regimen. -  Continue follow-up with rheumatology.      Follow up plan: Return in about 6 months (around 08/21/2024), or if symptoms worsen or fail to improve, for Physical exam.  Counseling provided for all of the vaccine components Orders Placed This Encounter  Procedures   CBC with Differential/Platelet   CMP14+EGFR   Lipid panel   TSH   PSA, total and free    Fonda Levins, MD [facility] 02/19/2024, 10:44 AM

## 2024-02-20 LAB — CMP14+EGFR
ALT: 26 IU/L (ref 0–44)
AST: 37 IU/L (ref 0–40)
Albumin: 4.1 g/dL (ref 3.8–4.8)
Alkaline Phosphatase: 116 IU/L (ref 44–121)
BUN/Creatinine Ratio: 14 (ref 10–24)
BUN: 14 mg/dL (ref 8–27)
Bilirubin Total: 0.3 mg/dL (ref 0.0–1.2)
CO2: 25 mmol/L (ref 20–29)
Calcium: 9.6 mg/dL (ref 8.6–10.2)
Chloride: 99 mmol/L (ref 96–106)
Creatinine, Ser: 1.02 mg/dL (ref 0.76–1.27)
Globulin, Total: 2.7 g/dL (ref 1.5–4.5)
Glucose: 90 mg/dL (ref 70–99)
Potassium: 5.5 mmol/L — AB (ref 3.5–5.2)
Sodium: 138 mmol/L (ref 134–144)
Total Protein: 6.8 g/dL (ref 6.0–8.5)
eGFR: 75 mL/min/1.73 (ref >59)

## 2024-02-20 LAB — CBC WITH DIFFERENTIAL/PLATELET
Basophils Absolute: 0 x10E3/uL (ref 0.0–0.2)
Basos: 1 %
EOS (ABSOLUTE): 0.2 x10E3/uL (ref 0.0–0.4)
Eos: 2 %
Hematocrit: 38.1 % (ref 37.5–51.0)
Hemoglobin: 13.1 g/dL (ref 13.0–17.7)
Immature Grans (Abs): 0 x10E3/uL (ref 0.0–0.1)
Immature Granulocytes: 0 %
Lymphocytes Absolute: 2 x10E3/uL (ref 0.7–3.1)
Lymphs: 24 %
MCH: 35.7 pg — ABNORMAL HIGH (ref 26.6–33.0)
MCHC: 34.4 g/dL (ref 31.5–35.7)
MCV: 104 fL — ABNORMAL HIGH (ref 79–97)
Monocytes Absolute: 1 x10E3/uL — ABNORMAL HIGH (ref 0.1–0.9)
Monocytes: 12 %
Neutrophils Absolute: 5.1 x10E3/uL (ref 1.4–7.0)
Neutrophils: 61 %
Platelets: 310 x10E3/uL (ref 150–450)
RBC: 3.67 x10E6/uL — ABNORMAL LOW (ref 4.14–5.80)
RDW: 12.9 % (ref 11.6–15.4)
WBC: 8.3 x10E3/uL (ref 3.4–10.8)

## 2024-02-20 LAB — LIPID PANEL
Cholesterol, Total: 150 mg/dL (ref 100–199)
HDL: 51 mg/dL (ref >39)
LDL CALC COMMENT:: 2.9 ratio (ref 0.0–5.0)
LDL Chol Calc (NIH): 81 mg/dL (ref 0–99)
Triglycerides: 98 mg/dL (ref 0–149)
VLDL Cholesterol Cal: 18 mg/dL (ref 5–40)

## 2024-02-20 LAB — PSA, TOTAL AND FREE
PSA, Free Pct: 38
PSA, Free: 0.19 ng/mL
Prostate Specific Ag, Serum: 0.5 ng/mL (ref 0.0–4.0)

## 2024-02-20 LAB — TSH: TSH: 4.19 u[IU]/mL (ref 0.450–4.500)

## 2024-02-25 ENCOUNTER — Ambulatory Visit: Payer: Self-pay | Admitting: Family Medicine

## 2024-04-07 DIAGNOSIS — Z79899 Other long term (current) drug therapy: Secondary | ICD-10-CM | POA: Diagnosis not present

## 2024-04-07 DIAGNOSIS — Z6824 Body mass index (BMI) 24.0-24.9, adult: Secondary | ICD-10-CM | POA: Diagnosis not present

## 2024-04-07 DIAGNOSIS — M1991 Primary osteoarthritis, unspecified site: Secondary | ICD-10-CM | POA: Diagnosis not present

## 2024-04-07 DIAGNOSIS — M0589 Other rheumatoid arthritis with rheumatoid factor of multiple sites: Secondary | ICD-10-CM | POA: Diagnosis not present

## 2024-04-21 DIAGNOSIS — R292 Abnormal reflex: Secondary | ICD-10-CM | POA: Diagnosis not present

## 2024-04-21 DIAGNOSIS — G629 Polyneuropathy, unspecified: Secondary | ICD-10-CM | POA: Diagnosis not present

## 2024-04-21 DIAGNOSIS — R2689 Other abnormalities of gait and mobility: Secondary | ICD-10-CM | POA: Diagnosis not present

## 2024-04-21 DIAGNOSIS — G40219 Localization-related (focal) (partial) symptomatic epilepsy and epileptic syndromes with complex partial seizures, intractable, without status epilepticus: Secondary | ICD-10-CM | POA: Diagnosis not present

## 2024-05-06 ENCOUNTER — Ambulatory Visit (INDEPENDENT_AMBULATORY_CARE_PROVIDER_SITE_OTHER)

## 2024-05-06 VITALS — BP 135/76 | HR 61 | Ht 66.0 in | Wt 144.0 lb

## 2024-05-06 DIAGNOSIS — Z0000 Encounter for general adult medical examination without abnormal findings: Secondary | ICD-10-CM | POA: Diagnosis not present

## 2024-05-06 NOTE — Progress Notes (Signed)
 Subjective:   Troy Rodgers. is a 78 y.o. who presents for a Medicare Wellness preventive visit.  As a reminder, Annual Wellness Visits don't include a physical exam, and some assessments may be limited, especially if this visit is performed virtually. We may recommend an in-person follow-up visit with your provider if needed.  Visit Complete: Virtual I connected with  Troy Rodgers. on 05/06/24 by a audio enabled telemedicine application and verified that I am speaking with the correct person using two identifiers.  Patient Location: Home  Provider Location: Home Office  I discussed the limitations of evaluation and management by telemedicine. The patient expressed understanding and agreed to proceed.  Vital Signs: Because this visit was a virtual/telehealth visit, some criteria may be missing or patient reported. Any vitals not documented were not able to be obtained and vitals that have been documented are patient reported.  VideoDeclined- This patient declined Librarian, academic. Therefore the visit was completed with audio only.  Persons Participating in Visit: Patient.  AWV Questionnaire: No: Patient Medicare AWV questionnaire was not completed prior to this visit.  Cardiac Risk Factors include: advanced age (>33men, >33 women)     Objective:    Today's Vitals   05/06/24 1252  BP: 135/76  Pulse: 61  Weight: 144 lb (65.3 kg)  Height: 5' 6 (1.676 m)   Body mass index is 23.24 kg/m.     05/06/2024   12:47 PM 03/25/2023   12:42 PM 03/03/2023   11:08 AM 02/27/2022   11:19 AM 02/26/2021   11:22 AM 05/19/2019    1:21 PM 10/29/2017    3:07 PM  Advanced Directives  Does Patient Have a Medical Advance Directive? No No Yes No Yes Yes No   Type of Best boy of [place];Living will  Healthcare Power of [place];Living will Healthcare Power of Attorney   Copy of Healthcare Power of Attorney in Chart?   No - copy  requested  No - copy requested    Would patient like information on creating a medical advance directive?    No - Patient declined  No - Patient declined Yes (MAU/Ambulatory/Procedural Areas - Information given)      Data saved with a previous flowsheet row definition    Current Medications (verified) Outpatient Encounter Medications as of 05/06/2024  Medication Sig   aspirin 81 MG EC tablet Take 81 mg by mouth daily.     atorvastatin  (LIPITOR) 40 MG tablet Take 1 tablet (40 mg total) by mouth daily.   Cholecalciferol (VITAMIN D3) 10 MCG (400 UNIT) CAPS Take 800 Units by mouth.   escitalopram  (LEXAPRO ) 20 MG tablet Take 1 tablet (20 mg total) by mouth daily.   ezetimibe  (ZETIA ) 10 MG tablet Take 1 tablet (10 mg total) by mouth daily.   folic acid (FOLVITE) 1 MG tablet Take 1 mg by mouth 2 (two) times daily.    lacosamide  (VIMPAT ) 200 MG TABS tablet Take 200 mg by mouth 2 (two) times daily.   levothyroxine  (SYNTHROID ) 50 MCG tablet Take 1 tablet (50 mcg total) by mouth daily.   methotrexate (RHEUMATREX) 2.5 MG tablet Take 10 mg by mouth once a week.    omega-3 fish oil (MAXEPA) 1000 MG CAPS capsule Take 2 capsules by mouth daily.   omeprazole  (PRILOSEC) 20 MG capsule TAKE 1 CAPSULE BY MOUTH EVERY DAY   primidone  (MYSOLINE ) 250 MG tablet 250 mg. 1 in the am and 1 at bedtime  No facility-administered encounter medications on file as of 05/06/2024.    Allergies (verified) Fentanyl  and Hydrocodone   History: Past Medical History:  Diagnosis Date   Abdominal pain    Arthritis    rheumatoid    Asthma    Bradycardia    Cataract    Chest pain    Cholelithiases    Depressive disorder, not elsewhere classified    GERD (gastroesophageal reflux disease)    Hearing deficit, bilateral    HLD (hyperlipidemia)    Hyperplasia of prostate    Impotence of organic origin    Kidney stone    Localization-related (focal) (partial) epilepsy and epileptic syndromes with complex partial seizures,  without mention of intractable epilepsy    Nasal septal perforation    Other and unspecified hyperlipidemia    Seizures (HCC)    Thyroid  disease    Past Surgical History:  Procedure Laterality Date   CHOLECYSTECTOMY  04/28/2013   Procedure: LAPAROSCOPIC CHOLECYSTECTOMY;  Surgeon: Lynda Leos, MD;  Location: [facility] OR;  Service: General;;   LEFT SHOULDER SURGERY  1996   Family History  Problem Relation Age of Onset   Kidney failure Father        died age 64   Kidney disease Father    Heart disease Father        bypass surgery   Colon cancer Mother    Alzheimer's disease Mother    Hyperlipidemia Mother    Heart failure Sister        CHF   Heart disease Sister    Peripheral vascular disease Sister    Rheum arthritis Sister    Pancreatitis Sister    Pneumonia Sister    Hyperlipidemia Son    Alzheimer's disease Maternal Grandmother    Peripheral vascular disease Sister    Anxiety disorder Son    Social History   Socioeconomic History   Marital status: Married    Spouse name: Dagoberto    Number of children: 2   Years of education: 12   Highest education level: Some college, no degree  Occupational History   Occupation: Retired    Associate Professor: [employer]    Comment: age 78  Tobacco Use   Smoking status: Never   Smokeless tobacco: Never  Vaping Use   Vaping status: Never Used  Substance and Sexual Activity   Alcohol use: No   Drug use: No   Sexual activity: Not on file  Other Topics Concern   Not on file  Social History Narrative   Lives with wife.     Children live nearby, talk on the phone daily, but only get together for holidays   Patient hard of hearing - wears hearing aids - mild cognitive impairment   Social Drivers of Corporate investment banker Strain: Low Risk  (05/06/2024)   Overall Financial Resource Strain (CARDIA)    Difficulty of Paying Living Expenses: Not hard at all  Food Insecurity: No Food Insecurity (05/06/2024)   Hunger Vital  Sign    Worried About Running Out of Food in the Last Year: Never true    Ran Out of Food in the Last Year: Never true  Transportation Needs: No Transportation Needs (05/06/2024)   PRAPARE - Administrator, Civil Service (Medical): No    Lack of Transportation (Non-Medical): No  Physical Activity: Insufficiently Active (05/06/2024)   Exercise Vital Sign    Days of Exercise per Week: 3 days    Minutes of Exercise per  Session: 30 min  Stress: No Stress Concern Present (05/06/2024)   [employer] of Occupational Health - Occupational Stress Questionnaire    Feeling of Stress: Not at all  Social Connections: Moderately Isolated (05/06/2024)   Social Connection and Isolation Panel    Frequency of Communication with Friends and Family: More than three times a week    Frequency of Social Gatherings with Friends and Family: More than three times a week    Attends Religious Services: Never    Database administrator or Organizations: No    Attends Engineer, structural: Never    Marital Status: Married    Tobacco Counseling Counseling given: Yes    Clinical Intake:  Pre-visit preparation completed: Yes  Pain : No/denies pain     BMI - recorded: 23.24 Nutritional Status: BMI of 19-24  Normal Nutritional Risks: None Diabetes: No  No results found for: HGBA1C   How often do you need to have someone help you when you read instructions, pamphlets, or other written materials from your doctor or pharmacy?: 1 - Never  Interpreter Needed?: No  Information entered by :: alia t/cma   Activities of Daily Living     05/06/2024   12:46 PM  In your present state of health, do you have any difficulty performing the following activities:  Hearing? 1  Vision? 0  Difficulty concentrating or making decisions? 0  Walking or climbing stairs? 1  Dressing or bathing? 0  Doing errands, shopping? 1  Comment pt's wife  Quarry manager and eating ? N  Using the  Toilet? N  In the past six months, have you accidently leaked urine? N  Do you have problems with loss of bowel control? N  Managing your Medications? Y  Comment pt's wife  Managing your Finances? Y  Comment pt's wife  Housekeeping or managing your Housekeeping? Y  Comment pt's wife    Patient Care Team: Dettinger, Fonda LABOR, MD as PCP - General (Family Medicine) Charmayne Molly, MD as Consulting Physician (Ophthalmology) Milton Lade, MD as Consulting Physician (Neurology) Lavona Agent, MD as Consulting Physician (Cardiology) Golda Claudis PENNER, MD (Inactive) as Consulting Physician (Gastroenterology) Mai Agent FALCON, MD as Consulting Physician (Rheumatology) Darlean Ozell NOVAK, MD as Consulting Physician (Pulmonary Disease)  I have updated your Care Teams any recent Medical Services you may have received from other providers in the past year.     Assessment:   This is a routine wellness examination for Troy Rodgers.  Hearing/Vision screen Hearing Screening - Comments:: Pt have hearing dif Vision Screening - Comments:: Pt wear glasses/dr Lyles in [place], [place]/last ov 7/25   Goals Addressed             This Visit's Progress    Increase physical activity   On track    Stay active      Prevent falls   On track    Stay active       Depression Screen     05/06/2024   12:48 PM 02/19/2024   10:25 AM 08/22/2023    9:54 AM 03/03/2023   11:07 AM 02/19/2023   10:20 AM 08/21/2022   10:00 AM 02/27/2022   11:18 AM  PHQ 2/9 Scores  PHQ - 2 Score 0 0 0 0 0 0 0  PHQ- 9 Score    0       Fall Risk     05/06/2024   12:43 PM 02/19/2024   10:25 AM 08/22/2023    9:54 AM  03/03/2023   11:05 AM 02/19/2023   10:20 AM  Fall Risk   Falls in the past year? 1 1 0 0 0  Number falls in past yr: 1 1  0   Injury with Fall? 0 0  0   Risk for fall due to : Impaired balance/gait;Impaired mobility Impaired balance/gait;History of fall(s)  No Fall Risks   Follow up Falls evaluation completed;Education  provided Falls evaluation completed  Falls prevention discussed     MEDICARE RISK AT HOME:  Medicare Risk at Home Any stairs in or around the home?: Yes If so, are there any without handrails?: Yes Home free of loose throw rugs in walkways, pet beds, electrical cords, etc?: Yes Adequate lighting in your home to reduce risk of falls?: Yes Life alert?: No Use of a cane, walker or w/c?: Yes Grab bars in the bathroom?: Yes Shower chair or bench in shower?: Yes Elevated toilet seat or a handicapped toilet?: Yes  TIMED UP AND GO:  Was the test performed?  no  Cognitive Function: 6CIT completed    10/29/2017    3:11 PM  MMSE - Mini Mental State Exam  Orientation to time 5  Orientation to Place 5  Registration 3  Attention/ Calculation 5  Recall 1  Language- name 2 objects 2  Language- repeat 1  Language- follow 3 step command 3  Language- read & follow direction 1  Write a sentence 1  Copy design 1  Total score 28        05/06/2024   12:50 PM 03/03/2023   11:08 AM 02/27/2022   11:20 AM 02/26/2021   11:08 AM 05/19/2019    1:24 PM  6CIT Screen  What Year? 0 points 0 points 0 points 0 points 0 points  What month? 0 points 0 points 0 points 3 points 0 points  What time? 0 points 0 points 0 points 0 points 0 points  Count back from 20 0 points 0 points 0 points 0 points 0 points  Months in reverse 4 points 0 points 0 points 4 points 0 points  Repeat phrase 8 points 0 points 6 points 6 points 2 points  Total Score 12 points 0 points 6 points 13 points 2 points    Immunizations Immunization History  Administered Date(s) Administered   Fluad Quad(high Dose 65+) 04/27/2019, 05/09/2020, 05/01/2021, 05/01/2022   Fluzone Influenza virus vaccine,trivalent (IIV3), split virus 04/25/2013   INFLUENZA, HIGH DOSE SEASONAL PF 05/07/2016, 05/07/2017, 04/27/2018, 04/21/2023, 04/15/2024   Influenza Split 04/14/2013   Influenza Whole 04/14/2010   Influenza,inj,Quad PF,6+ Mos 05/04/2014,  04/19/2015   Moderna Covid-19 Fall Seasonal Vaccine 15yrs & older 04/22/2022, 05/03/2024   Moderna Sars-Covid-2 Vaccination 08/20/2019, 09/18/2019, 05/22/2020, 11/23/2020   Pneumococcal Conjugate-13 08/23/2013   Pneumococcal Polysaccharide-23 12/19/2010   Td 05/15/2008   Tdap 06/01/2018   Tetanus 09/19/1997   Zoster Recombinant(Shingrix ) 08/22/2021, 02/18/2022   Zoster, Live 05/15/2008    Screening Tests Health Maintenance  Topic Date Due   Colonoscopy  07/21/2024 (Originally 05/11/2019)   COVID-19 Vaccine (7 - Moderna risk 2025-26 season) 11/01/2024   Medicare Annual Wellness (AWV)  05/06/2025   DTaP/Tdap/Td (3 - Td or Tdap) 06/01/2028   Pneumococcal Vaccine: 50+ Years  Completed   Influenza Vaccine  Completed   Hepatitis C Screening  Completed   Zoster Vaccines- Shingrix   Completed   Meningococcal B Vaccine  Aged Out    Health Maintenance Items Addressed: See Nurse Notes at the end of this note  Additional Screening:  Vision Screening: Recommended annual ophthalmology exams for early detection of glaucoma and other disorders of the eye. Is the patient up to date with their annual eye exam?  Yes  Who is the provider or what is the name of the office in which the patient attends annual eye exams? Dr. Charmayne in [place], [place]   Dental Screening: Recommended annual dental exams for proper oral hygiene  Community Resource Referral / Chronic Care Management: CRR required this visit?  No   CCM required this visit?  No   Plan:    I have personally reviewed and noted the following in the patient's chart:   Medical and social history Use of alcohol, tobacco or illicit drugs  Current medications and supplements including opioid prescriptions. Patient is not currently taking opioid prescriptions. Functional ability and status Nutritional status Physical activity Advanced directives List of other physicians Hospitalizations, surgeries, and ER visits in previous 12  months Vitals Screenings to include cognitive, depression, and falls Referrals and appointments  In addition, I have reviewed and discussed with patient certain preventive protocols, quality metrics, and best practice recommendations. A written personalized care plan for preventive services as well as general preventive health recommendations were provided to patient.   Ozie Ned, CMA   05/06/2024   After Visit Summary: (MyChart) Due to this being a telephonic visit, the after visit summary with patients personalized plan was offered to patient via MyChart   Notes: Nothing significant to report at this time.

## 2024-05-06 NOTE — Patient Instructions (Signed)
 [name],  Thank you for taking the time for your Medicare Wellness Visit. I appreciate your continued commitment to your health goals. Please review the care plan we discussed, and feel free to reach out if I can assist you further.  Medicare recommends these wellness visits once per year to help you and your care team stay ahead of potential health issues. These visits are designed to focus on prevention, allowing your provider to concentrate on managing your acute and chronic conditions during your regular appointments.  Please note that Annual Wellness Visits do not include a physical exam. Some assessments may be limited, especially if the visit was conducted virtually. If needed, we may recommend a separate in-person follow-up with your provider.  Ongoing Care Seeing your primary care provider every 3 to 6 months helps us  monitor your health and provide consistent, personalized care.   Referrals If a referral was made during today's visit and you haven't received any updates within two weeks, please contact the referred provider directly to check on the status.  Recommended Screenings:  Health Maintenance  Topic Date Due   Medicare Annual Wellness Visit  03/02/2024   Colon Cancer Screening  07/21/2024*   COVID-19 Vaccine (7 - Moderna risk 2025-26 season) 11/01/2024   DTaP/Tdap/Td vaccine (3 - Td or Tdap) 06/01/2028   Pneumococcal Vaccine for age over 91  Completed   Flu Shot  Completed   Hepatitis C Screening  Completed   Zoster (Shingles) Vaccine  Completed   Meningitis B Vaccine  Aged Out  *Topic was postponed. The date shown is not the original due date.       05/06/2024   12:47 PM  Advanced Directives  Does Patient Have a Medical Advance Directive? No   Advance Care Planning is important because it: Ensures you receive medical care that aligns with your values, goals, and preferences. Provides guidance to your family and loved ones, reducing the emotional burden of  decision-making during critical moments.  Vision: Annual vision screenings are recommended for early detection of glaucoma, cataracts, and diabetic retinopathy. These exams can also reveal signs of chronic conditions such as diabetes and high blood pressure.  Dental: Annual dental screenings help detect early signs of oral cancer, gum disease, and other conditions linked to overall health, including heart disease and diabetes.  Please see the attached documents for additional preventive care recommendations.

## 2024-07-19 NOTE — Telephone Encounter (Signed)
 Per TL, pt was scheduled by PAS without triage

## 2024-07-20 ENCOUNTER — Ambulatory Visit: Admitting: Family Medicine

## 2024-07-20 ENCOUNTER — Encounter: Payer: Self-pay | Admitting: Family Medicine

## 2024-07-20 VITALS — BP 146/65 | HR 81 | Temp 97.5°F | Ht 66.0 in | Wt 147.0 lb

## 2024-07-20 DIAGNOSIS — J029 Acute pharyngitis, unspecified: Secondary | ICD-10-CM

## 2024-07-20 DIAGNOSIS — H04203 Unspecified epiphora, bilateral lacrimal glands: Secondary | ICD-10-CM | POA: Diagnosis not present

## 2024-07-20 DIAGNOSIS — J3089 Other allergic rhinitis: Secondary | ICD-10-CM

## 2024-07-20 DIAGNOSIS — R0982 Postnasal drip: Secondary | ICD-10-CM

## 2024-07-20 LAB — VERITOR SARS-COV-2 AND FLU A+B
BD Veritor SARS-CoV-2 Ag: NEGATIVE
Influenza A: NEGATIVE
Influenza B: NEGATIVE

## 2024-07-20 MED ORDER — LEVOCETIRIZINE DIHYDROCHLORIDE 5 MG PO TABS: 5.0000 mg | ORAL_TABLET | Freq: Every evening | ORAL | 2 refills | Status: AC

## 2024-07-20 MED ORDER — FLUTICASONE PROPIONATE 50 MCG/ACT NA SUSP: 2.0000 | Freq: Every day | NASAL | 6 refills | Status: AC

## 2024-07-20 NOTE — Progress Notes (Signed)
 "    Subjective:  Patient ID: Troy SHAUNNA Gladis Mickey., male    DOB: 03-Jul-1946, 79 y.o.   MRN: [id number]  Patient Care Team: Dettinger, Fonda LABOR, MD as PCP - General (Family Medicine) Charmayne Molly, MD as Consulting Physician (Ophthalmology) Milton Lade, MD as Consulting Physician (Neurology) Lavona Agent, MD as Consulting Physician (Cardiology) Golda Claudis PENNER, MD (Inactive) as Consulting Physician (Gastroenterology) Mai Agent FALCON, MD as Consulting Physician (Rheumatology) Darlean Ozell NOVAK, MD as Consulting Physician (Pulmonary Disease)   Chief Complaint:  Sore Throat and Nasal Congestion (X 1 week on and off )   HPI: Troy Lappe. is a 79 y.o. male presenting on 07/20/2024 for Sore Throat and Nasal Congestion (X 1 week on and off )     Troy P Garold Sheeler. is a 79 year old male who presents with a sore throat and runny nose.  He has been experiencing a sore throat, runny nose, and watery eyes for a few days. The throat symptoms are described as 'scratchy' or 'burning' and tend to worsen by the evening. No fever has been noted, and there has been no recent exposure to sick contacts.  He has been taking antihistamines, which have helped with the watery eyes, and Advil for a couple of days to alleviate symptoms. The antihistamines have provided some relief for the throat, although the symptoms persist into the evening.  No recent exposure to sick contacts has been reported, and he has not experienced any pressure in the sinuses. He denies fever and sinus pressure.          Relevant past medical, surgical, family, and social history reviewed and updated as indicated.  Allergies and medications reviewed and updated. Data reviewed: Chart in Epic.   Past Medical History:  Diagnosis Date   Abdominal pain    Arthritis    rheumatoid    Asthma    Bradycardia    Cataract    Chest pain    Cholelithiases    Depressive disorder, not elsewhere classified    GERD (gastroesophageal  reflux disease)    Hearing deficit, bilateral    HLD (hyperlipidemia)    Hyperplasia of prostate    Impotence of organic origin    Kidney stone    Localization-related (focal) (partial) epilepsy and epileptic syndromes with complex partial seizures, without mention of intractable epilepsy    Nasal septal perforation    Other and unspecified hyperlipidemia    Seizures (HCC)    Thyroid  disease     Past Surgical History:  Procedure Laterality Date   CHOLECYSTECTOMY  04/28/2013   Procedure: LAPAROSCOPIC CHOLECYSTECTOMY;  Surgeon: Lynda Leos, MD;  Location: [facility] OR;  Service: General;;   LEFT SHOULDER SURGERY  1996    Social History   Socioeconomic History   Marital status: Married    Spouse name: Dagoberto    Number of children: 2   Years of education: 12   Highest education level: Some college, no degree  Occupational History   Occupation: Retired    Associate Professor: [employer]    Comment: age 45  Tobacco Use   Smoking status: Never   Smokeless tobacco: Never  Vaping Use   Vaping status: Never Used  Substance and Sexual Activity   Alcohol use: No   Drug use: No   Sexual activity: Not on file  Other Topics Concern   Not on file  Social History Narrative   Lives with wife.     Children live nearby, talk  on the phone daily, but only get together for holidays   Patient hard of hearing - wears hearing aids - mild cognitive impairment   Social Drivers of Health   Tobacco Use: Low Risk (07/20/2024)   Patient History    Smoking Tobacco Use: Never    Smokeless Tobacco Use: Never    Passive Exposure: Not on file  Financial Resource Strain: Low Risk (05/06/2024)   Overall Financial Resource Strain (CARDIA)    Difficulty of Paying Living Expenses: Not hard at all  Food Insecurity: No Food Insecurity (05/06/2024)   Epic    Worried About Radiation Protection Practitioner of Food in the Last Year: Never true    Ran Out of Food in the Last Year: Never true  Transportation Needs: No  Transportation Needs (05/06/2024)   Epic    Lack of Transportation (Medical): No    Lack of Transportation (Non-Medical): No  Physical Activity: Insufficiently Active (05/06/2024)   Exercise Vital Sign    Days of Exercise per Week: 3 days    Minutes of Exercise per Session: 30 min  Stress: No Stress Concern Present (05/06/2024)   [employer] of Occupational Health - Occupational Stress Questionnaire    Feeling of Stress: Not at all  Social Connections: Moderately Isolated (05/06/2024)   Social Connection and Isolation Panel    Frequency of Communication with Friends and Family: More than three times a week    Frequency of Social Gatherings with Friends and Family: More than three times a week    Attends Religious Services: Never    Database Administrator or Organizations: No    Attends Banker Meetings: Never    Marital Status: Married  Catering Manager Violence: Not At Risk (05/06/2024)   Epic    Fear of Current or Ex-Partner: No    Emotionally Abused: No    Physically Abused: No    Sexually Abused: No  Depression (PHQ2-9): Low Risk (07/20/2024)   Depression (PHQ2-9)    PHQ-2 Score: 2  Alcohol Screen: Low Risk (05/06/2024)   Alcohol Screen    Last Alcohol Screening Score (AUDIT): 0  Housing: Unknown (05/06/2024)   Epic    Unable to Pay for Housing in the Last Year: No    Number of Times Moved in the Last Year: Not on file    Homeless in the Last Year: No  Utilities: Not At Risk (05/06/2024)   Epic    Threatened with loss of utilities: No  Health Literacy: Adequate Health Literacy (05/06/2024)   B1300 Health Literacy    Frequency of need for help with medical instructions: Never    Outpatient Encounter Medications as of 07/20/2024  Medication Sig   aspirin 81 MG EC tablet Take 81 mg by mouth daily.     atorvastatin  (LIPITOR) 40 MG tablet Take 1 tablet (40 mg total) by mouth daily.   Cholecalciferol (VITAMIN D3) 10 MCG (400 UNIT) CAPS Take 800 Units by  mouth.   escitalopram  (LEXAPRO ) 20 MG tablet Take 1 tablet (20 mg total) by mouth daily.   ezetimibe  (ZETIA ) 10 MG tablet Take 1 tablet (10 mg total) by mouth daily.   fluticasone  (FLONASE ) 50 MCG/ACT nasal spray Place 2 sprays into both nostrils daily.   folic acid (FOLVITE) 1 MG tablet Take 1 mg by mouth 2 (two) times daily.    lacosamide  (VIMPAT ) 200 MG TABS tablet Take 200 mg by mouth 2 (two) times daily.   levocetirizine (XYZAL ) 5 MG tablet Take 1 tablet (5  mg total) by mouth every evening.   levothyroxine  (SYNTHROID ) 50 MCG tablet Take 1 tablet (50 mcg total) by mouth daily.   methotrexate (RHEUMATREX) 2.5 MG tablet Take 10 mg by mouth once a week.    omega-3 fish oil (MAXEPA) 1000 MG CAPS capsule Take 2 capsules by mouth daily.   omeprazole  (PRILOSEC) 20 MG capsule TAKE 1 CAPSULE BY MOUTH EVERY DAY   primidone  (MYSOLINE ) 250 MG tablet 250 mg. 1 in the am and 1 at bedtime   No facility-administered encounter medications on file as of 07/20/2024.    Allergies[1]  Pertinent ROS per HPI, otherwise unremarkable      Objective:  BP (!) 146/65   Pulse 81   Temp (!) 97.5 F (36.4 C)   Ht 5' 6 (1.676 m)   Wt 147 lb (66.7 kg)   SpO2 94%   BMI 23.73 kg/m    Wt Readings from Last 3 Encounters:  07/20/24 147 lb (66.7 kg)  05/06/24 144 lb (65.3 kg)  02/19/24 144 lb (65.3 kg)    Physical Exam Vitals and nursing note reviewed.  Constitutional:      General: He is not in acute distress.    Appearance: He is ill-appearing (chronically ill). He is not toxic-appearing or diaphoretic.  HENT:     Head: Normocephalic and atraumatic.     Right Ear: Decreased hearing noted. A middle ear effusion is present. Tympanic membrane is not erythematous.     Left Ear: Decreased hearing noted. A middle ear effusion is present. Tympanic membrane is not erythematous.     Nose: Congestion present.     Right Turbinates: Enlarged.     Left Turbinates: Enlarged.     Right Sinus: No maxillary sinus  tenderness or frontal sinus tenderness.     Left Sinus: No maxillary sinus tenderness or frontal sinus tenderness.     Mouth/Throat:     Mouth: Mucous membranes are moist.     Pharynx: Postnasal drip present. No oropharyngeal exudate or posterior oropharyngeal erythema.  Eyes:     General:        Right eye: No discharge.        Left eye: No discharge.     Conjunctiva/sclera: Conjunctivae normal.     Pupils: Pupils are equal, round, and reactive to light.  Cardiovascular:     Rate and Rhythm: Normal rate and regular rhythm.     Heart sounds: Normal heart sounds.  Pulmonary:     Effort: Pulmonary effort is normal.     Breath sounds: Normal breath sounds.  Musculoskeletal:     Cervical back: Neck supple.     Comments: Kyphosis  Lymphadenopathy:     Cervical: No cervical adenopathy.  Skin:    General: Skin is warm and dry.     Capillary Refill: Capillary refill takes less than 2 seconds.  Neurological:     General: No focal deficit present.     Mental Status: He is alert and oriented to person, place, and time.     Gait: Gait abnormal (slow, using cane).  Psychiatric:        Mood and Affect: Mood normal.        Behavior: Behavior normal. Behavior is cooperative.        Thought Content: Thought content normal.        Judgment: Judgment normal.    Physical Exam  HEENT: Postnasal drainage and cobblestoning in the throat.        Results for orders placed  or performed in visit on 02/19/24  CBC with Differential/Platelet   Collection Time: 02/19/24 10:55 AM  Result Value Ref Range   WBC 8.3 3.4 - 10.8 x10E3/uL   RBC 3.67 (L) 4.14 - 5.80 x10E6/uL   Hemoglobin 13.1 13.0 - 17.7 g/dL   Hematocrit 61.8 62.4 - 51.0 %   MCV 104 (H) 79 - 97 fL   MCH 35.7 (H) 26.6 - 33.0 pg   MCHC 34.4 31.5 - 35.7 g/dL   RDW 87.0 88.3 - 84.5 %   Platelets 310 150 - 450 x10E3/uL   Neutrophils 61 Not Estab. %   Lymphs 24 Not Estab. %   Monocytes 12 Not Estab. %   Eos 2 Not Estab. %   Basos 1 Not  Estab. %   Neutrophils Absolute 5.1 1.4 - 7.0 x10E3/uL   Lymphocytes Absolute 2.0 0.7 - 3.1 x10E3/uL   Monocytes Absolute 1.0 (H) 0.1 - 0.9 x10E3/uL   EOS (ABSOLUTE) 0.2 0.0 - 0.4 x10E3/uL   Basophils Absolute 0.0 0.0 - 0.2 x10E3/uL   Immature Granulocytes 0 Not Estab. %   Immature Grans (Abs) 0.0 0.0 - 0.1 x10E3/uL  CMP14+EGFR   Collection Time: 02/19/24 10:55 AM  Result Value Ref Range   Glucose 90 70 - 99 mg/dL   BUN 14 8 - 27 mg/dL   Creatinine, Ser 8.97 0.76 - 1.27 mg/dL   eGFR 75 >40 fO/fpw/8.26   BUN/Creatinine Ratio 14 10 - 24   Sodium 138 134 - 144 mmol/L   Potassium 5.5 (H) 3.5 - 5.2 mmol/L   Chloride 99 96 - 106 mmol/L   CO2 25 20 - 29 mmol/L   Calcium  9.6 8.6 - 10.2 mg/dL   Total Protein 6.8 6.0 - 8.5 g/dL   Albumin 4.1 3.8 - 4.8 g/dL   Globulin, Total 2.7 1.5 - 4.5 g/dL   Bilirubin Total 0.3 0.0 - 1.2 mg/dL   Alkaline Phosphatase 116 44 - 121 IU/L   AST 37 0 - 40 IU/L   ALT 26 0 - 44 IU/L  Lipid panel   Collection Time: 02/19/24 10:55 AM  Result Value Ref Range   Cholesterol, Total 150 100 - 199 mg/dL   Triglycerides 98 0 - 149 mg/dL   HDL 51 >60 mg/dL   VLDL Cholesterol Cal 18 5 - 40 mg/dL   LDL Chol Calc (NIH) 81 0 - 99 mg/dL   Chol/HDL Ratio 2.9 0.0 - 5.0 ratio  TSH   Collection Time: 02/19/24 10:55 AM  Result Value Ref Range   TSH 4.190 0.450 - 4.500 uIU/mL  PSA, total and free   Collection Time: 02/19/24 10:55 AM  Result Value Ref Range   Prostate Specific Ag, Serum 0.5 0.0 - 4.0 ng/mL   PSA, Free 0.19 N/A ng/mL   PSA, Free Pct 38.0 %       Pertinent labs & imaging results that were available during my care of the patient were reviewed by me and considered in my medical decision making.  Assessment & Plan:  Troy Rodgers was seen today for sore throat and nasal congestion.  Diagnoses and all orders for this visit:  Sore throat -     Veritor SARS-CoV-2 and Flu A+B -     levocetirizine (XYZAL ) 5 MG tablet; Take 1 tablet (5 mg total) by mouth every  evening. -     fluticasone  (FLONASE ) 50 MCG/ACT nasal spray; Place 2 sprays into both nostrils daily.  Postnasal discharge -     levocetirizine (XYZAL ) 5  MG tablet; Take 1 tablet (5 mg total) by mouth every evening. -     fluticasone  (FLONASE ) 50 MCG/ACT nasal spray; Place 2 sprays into both nostrils daily.  Watery eyes -     levocetirizine (XYZAL ) 5 MG tablet; Take 1 tablet (5 mg total) by mouth every evening. -     fluticasone  (FLONASE ) 50 MCG/ACT nasal spray; Place 2 sprays into both nostrils daily.  Non-seasonal allergic rhinitis due to other allergic trigger -     levocetirizine (XYZAL ) 5 MG tablet; Take 1 tablet (5 mg total) by mouth every evening. -     fluticasone  (FLONASE ) 50 MCG/ACT nasal spray; Place 2 sprays into both nostrils daily.      Allergic rhinitis Chronic allergic rhinitis with postnasal drainage, sore throat, and watery eyes. Symptoms have persisted for several days without fever. Examination reveals cobblestone appearance in the throat, consistent with allergic rhinitis. Negative for influenza and COVID-19, supporting allergic etiology. - Prescribed Xyzal  (levocetirizine) 5 mg at bedtime. - Prescribed Flonase  (fluticasone ) nasal spray for morning use. - Advised on proper Flonase  administration technique. - Instructed to report any worsening or new symptoms.          Continue all other maintenance medications.  Follow up plan: Return if symptoms worsen or fail to improve.   Continue healthy lifestyle choices, including diet (rich in fruits, vegetables, and lean proteins, and low in salt and simple carbohydrates) and exercise (at least 30 minutes of moderate physical activity daily).  Educational handout given for allergic rhinitis   The above assessment and management plan was discussed with the patient. The patient verbalized understanding of and has agreed to the management plan. Patient is aware to call the clinic if they develop any new symptoms or if  symptoms persist or worsen. Patient is aware when to return to the clinic for a follow-up visit. Patient educated on when it is appropriate to go to the emergency department.   Troy Bruns, FNP-C [facility] [phone]     [1]  Allergies Allergen Reactions   Fentanyl  Other (See Comments)    In high doses of 100 mcg pt's O2 sats decrease to 60% RA   Hydrocodone     Intolerance    "

## 2024-07-21 ENCOUNTER — Ambulatory Visit: Payer: Self-pay | Admitting: Family Medicine

## 2024-08-23 ENCOUNTER — Encounter: Admitting: Family Medicine

## 2024-08-23 ENCOUNTER — Encounter: Payer: Self-pay | Admitting: Family Medicine

## 2025-05-09 ENCOUNTER — Ambulatory Visit

## 2025-05-10 ENCOUNTER — Ambulatory Visit: Payer: Self-pay
